# Patient Record
Sex: Female | Born: 1950
Health system: Southern US, Community
[De-identification: ages and names within clinical notes are randomized; demographics above are authoritative.]

## PROBLEM LIST (undated history)

## (undated) DIAGNOSIS — E7800 Pure hypercholesterolemia, unspecified: Secondary | ICD-10-CM

## (undated) DIAGNOSIS — D649 Anemia, unspecified: Secondary | ICD-10-CM

## (undated) DIAGNOSIS — I2510 Atherosclerotic heart disease of native coronary artery without angina pectoris: Secondary | ICD-10-CM

## (undated) DIAGNOSIS — I10 Essential (primary) hypertension: Secondary | ICD-10-CM

## (undated) HISTORY — PX: TONSILLECTOMY: SHX5217

## (undated) HISTORY — DX: Atherosclerotic heart disease of native coronary artery without angina pectoris: I25.10

## (undated) HISTORY — DX: Pure hypercholesterolemia, unspecified: E78.00

## (undated) HISTORY — DX: Anemia, unspecified: D64.9

## (undated) HISTORY — PX: WISDOM TOOTH EXTRACTION: SHX21

## (undated) HISTORY — PX: FOOT SURGERY: SHX648

## (undated) HISTORY — DX: Essential (primary) hypertension: I10

---

## 1998-01-07 ENCOUNTER — Emergency Department (HOSPITAL_COMMUNITY): Admission: EM | Admit: 1998-01-07 | Discharge: 1998-01-07 | Payer: Self-pay | Admitting: Emergency Medicine

## 2002-07-10 ENCOUNTER — Encounter: Payer: Self-pay | Admitting: Endocrinology

## 2002-07-10 ENCOUNTER — Ambulatory Visit (HOSPITAL_COMMUNITY): Admission: RE | Admit: 2002-07-10 | Discharge: 2002-07-10 | Payer: Self-pay | Admitting: Endocrinology

## 2003-04-02 ENCOUNTER — Other Ambulatory Visit: Admission: RE | Admit: 2003-04-02 | Discharge: 2003-04-02 | Payer: Self-pay | Admitting: *Deleted

## 2003-04-06 ENCOUNTER — Encounter: Admission: RE | Admit: 2003-04-06 | Discharge: 2003-04-06 | Payer: Self-pay | Admitting: *Deleted

## 2004-07-15 ENCOUNTER — Encounter: Admission: RE | Admit: 2004-07-15 | Discharge: 2004-07-15 | Payer: Self-pay | Admitting: *Deleted

## 2006-01-05 ENCOUNTER — Encounter: Admission: RE | Admit: 2006-01-05 | Discharge: 2006-01-05 | Payer: Self-pay | Admitting: Endocrinology

## 2007-01-15 ENCOUNTER — Encounter: Admission: RE | Admit: 2007-01-15 | Discharge: 2007-01-15 | Payer: Self-pay | Admitting: Endocrinology

## 2008-01-16 ENCOUNTER — Encounter: Admission: RE | Admit: 2008-01-16 | Discharge: 2008-01-16 | Payer: Self-pay | Admitting: Endocrinology

## 2008-03-06 ENCOUNTER — Emergency Department (HOSPITAL_COMMUNITY): Admission: EM | Admit: 2008-03-06 | Discharge: 2008-03-06 | Payer: Self-pay | Admitting: Emergency Medicine

## 2008-03-11 ENCOUNTER — Emergency Department (HOSPITAL_COMMUNITY): Admission: EM | Admit: 2008-03-11 | Discharge: 2008-03-11 | Payer: Self-pay | Admitting: Emergency Medicine

## 2009-01-18 ENCOUNTER — Encounter: Admission: RE | Admit: 2009-01-18 | Discharge: 2009-01-18 | Payer: Self-pay | Admitting: Endocrinology

## 2010-01-19 ENCOUNTER — Encounter: Admission: RE | Admit: 2010-01-19 | Discharge: 2010-01-19 | Payer: Self-pay | Admitting: Endocrinology

## 2010-07-19 ENCOUNTER — Ambulatory Visit: Payer: Self-pay | Admitting: Obstetrics and Gynecology

## 2010-08-16 ENCOUNTER — Ambulatory Visit: Payer: Self-pay | Admitting: Obstetrics & Gynecology

## 2010-09-13 ENCOUNTER — Ambulatory Visit: Payer: Self-pay | Admitting: Family Medicine

## 2010-10-18 ENCOUNTER — Encounter: Payer: Self-pay | Admitting: Family Medicine

## 2010-10-18 ENCOUNTER — Ambulatory Visit (INDEPENDENT_AMBULATORY_CARE_PROVIDER_SITE_OTHER): Payer: BC Managed Care – PPO | Admitting: Family Medicine

## 2010-10-18 DIAGNOSIS — E7800 Pure hypercholesterolemia, unspecified: Secondary | ICD-10-CM

## 2010-10-18 DIAGNOSIS — D649 Anemia, unspecified: Secondary | ICD-10-CM

## 2010-10-18 DIAGNOSIS — Z1272 Encounter for screening for malignant neoplasm of vagina: Secondary | ICD-10-CM

## 2010-10-18 DIAGNOSIS — Z01419 Encounter for gynecological examination (general) (routine) without abnormal findings: Secondary | ICD-10-CM

## 2010-10-18 DIAGNOSIS — I10 Essential (primary) hypertension: Secondary | ICD-10-CM

## 2010-10-18 DIAGNOSIS — E119 Type 2 diabetes mellitus without complications: Secondary | ICD-10-CM | POA: Insufficient documentation

## 2010-10-18 DIAGNOSIS — E1169 Type 2 diabetes mellitus with other specified complication: Secondary | ICD-10-CM | POA: Insufficient documentation

## 2010-10-18 NOTE — Progress Notes (Signed)
  Subjective:     Vicki Perry is a 60 y.o. female and is here for a comprehensive physical exam. The patient reports no problems.  History   Social History  . Marital Status: Single    Spouse Name: N/A    Number of Children: N/A  . Years of Education: N/A   Occupational History  . Not on file.   Social History Main Topics  . Smoking status: Never Smoker   . Smokeless tobacco: Not on file  . Alcohol Use: No  . Drug Use: No  . Sexually Active:    Other Topics Concern  . Not on file   Social History Narrative  . No narrative on file   No health maintenance topics applied.  The following portions of the patient's history were reviewed and updated as appropriate: allergies, current medications, past family history, past medical history, past social history, past surgical history and problem list.  Review of Systems A comprehensive review of systems was negative except for: Constitutional: positive for hot sweats   Objective:    BP 157/76  Pulse 59  Ht 5' (1.524 m)  Wt 144 lb (65.318 kg)  BMI 28.12 kg/m2 General appearance: alert, cooperative and appears stated age Head: Normocephalic, without obvious abnormality, atraumatic Neck: no adenopathy, no carotid bruit, no JVD, supple, symmetrical, trachea midline and thyroid not enlarged, symmetric, no tenderness/mass/nodules Lungs: clear to auscultation bilaterally Breasts: normal appearance, no masses or tenderness Heart: regular rate and rhythm, S1, S2 normal, no murmur, click, rub or gallop Abdomen: soft, non-tender; bowel sounds normal; no masses,  no organomegaly Pelvic: cervix normal in appearance, external genitalia normal, no adnexal masses or tenderness, no cervical motion tenderness, uterus normal size, shape, and consistency and atrophic vagina Extremities: extremities normal, atraumatic, no cyanosis or edema Pulses: 2+ and symmetric Skin: Skin color, texture, turgor normal. No rashes or lesions Lymph nodes:  Cervical, supraclavicular, and axillary nodes normal. Neurologic: Grossly normal    Assessment:    Healthy female exam. Postmenopausal. Doing well.     Plan:     Pap smear today. Mammogram in 10/12. F/u 1 year.

## 2010-12-19 ENCOUNTER — Other Ambulatory Visit: Payer: Self-pay | Admitting: Endocrinology

## 2010-12-19 DIAGNOSIS — Z1231 Encounter for screening mammogram for malignant neoplasm of breast: Secondary | ICD-10-CM

## 2011-01-23 ENCOUNTER — Ambulatory Visit
Admission: RE | Admit: 2011-01-23 | Discharge: 2011-01-23 | Disposition: A | Discharge status: Home / Self Care | Payer: BC Managed Care – PPO | Source: Ambulatory Visit | Attending: Endocrinology | Admitting: Endocrinology

## 2011-01-23 DIAGNOSIS — Z1231 Encounter for screening mammogram for malignant neoplasm of breast: Secondary | ICD-10-CM

## 2011-12-28 ENCOUNTER — Other Ambulatory Visit: Payer: Self-pay | Admitting: Endocrinology

## 2011-12-28 DIAGNOSIS — Z1231 Encounter for screening mammogram for malignant neoplasm of breast: Secondary | ICD-10-CM

## 2012-01-31 ENCOUNTER — Ambulatory Visit
Admission: RE | Admit: 2012-01-31 | Discharge: 2012-01-31 | Disposition: A | Discharge status: Home / Self Care | Payer: BC Managed Care – PPO | Source: Ambulatory Visit | Attending: Endocrinology | Admitting: Endocrinology

## 2012-01-31 DIAGNOSIS — Z1231 Encounter for screening mammogram for malignant neoplasm of breast: Secondary | ICD-10-CM

## 2012-12-26 ENCOUNTER — Other Ambulatory Visit: Payer: Self-pay

## 2012-12-26 DIAGNOSIS — Z1231 Encounter for screening mammogram for malignant neoplasm of breast: Secondary | ICD-10-CM

## 2013-01-08 ENCOUNTER — Other Ambulatory Visit: Payer: BC Managed Care – PPO

## 2013-01-31 ENCOUNTER — Other Ambulatory Visit: Payer: Self-pay | Admitting: *Deleted

## 2013-01-31 ENCOUNTER — Other Ambulatory Visit (INDEPENDENT_AMBULATORY_CARE_PROVIDER_SITE_OTHER): Payer: BC Managed Care – PPO

## 2013-01-31 ENCOUNTER — Ambulatory Visit
Admission: RE | Admit: 2013-01-31 | Discharge: 2013-01-31 | Disposition: A | Discharge status: Home / Self Care | Payer: BC Managed Care – PPO | Source: Ambulatory Visit

## 2013-01-31 DIAGNOSIS — E7800 Pure hypercholesterolemia, unspecified: Secondary | ICD-10-CM

## 2013-01-31 DIAGNOSIS — E119 Type 2 diabetes mellitus without complications: Secondary | ICD-10-CM

## 2013-01-31 DIAGNOSIS — Z1231 Encounter for screening mammogram for malignant neoplasm of breast: Secondary | ICD-10-CM

## 2013-01-31 LAB — BASIC METABOLIC PANEL
CO2: 27 mEq/L (ref 19–32)
Chloride: 104 mEq/L (ref 96–112)
Glucose, Bld: 80 mg/dL (ref 70–99)
Potassium: 4 mEq/L (ref 3.5–5.1)
Sodium: 136 mEq/L (ref 135–145)

## 2013-02-02 ENCOUNTER — Other Ambulatory Visit: Payer: Self-pay | Admitting: Endocrinology

## 2013-02-10 ENCOUNTER — Encounter: Payer: Self-pay | Admitting: Endocrinology

## 2013-02-10 ENCOUNTER — Ambulatory Visit (INDEPENDENT_AMBULATORY_CARE_PROVIDER_SITE_OTHER): Payer: BC Managed Care – PPO | Admitting: Endocrinology

## 2013-02-10 VITALS — BP 130/70 | HR 64 | Temp 98.6°F | Resp 12 | Ht 61.0 in | Wt 141.9 lb

## 2013-02-10 DIAGNOSIS — I10 Essential (primary) hypertension: Secondary | ICD-10-CM

## 2013-02-10 DIAGNOSIS — Z23 Encounter for immunization: Secondary | ICD-10-CM

## 2013-02-10 DIAGNOSIS — E7800 Pure hypercholesterolemia, unspecified: Secondary | ICD-10-CM

## 2013-02-10 DIAGNOSIS — E119 Type 2 diabetes mellitus without complications: Secondary | ICD-10-CM | POA: Insufficient documentation

## 2013-02-10 DIAGNOSIS — D649 Anemia, unspecified: Secondary | ICD-10-CM

## 2013-02-10 DIAGNOSIS — E559 Vitamin D deficiency, unspecified: Secondary | ICD-10-CM

## 2013-02-10 NOTE — Patient Instructions (Signed)
Restart Exercise Please check blood sugars at least half the time about 2 hours after any meal and as directed on waking up. Please bring blood sugar monitor to each visit  

## 2013-02-10 NOTE — Progress Notes (Signed)
Patient ID: Vicki Perry, female   DOB: November 25, 1950, 62 y.o.   MRN: [id number]   Reason for Appointment: Diabetes and blood pressure follow-up   History of Present Illness   Diagnosis: Type 2 DIABETES MELITUS, date of diagnosis:  2008     Previous history: She was diagnosed with mild diabetes in 2008 with a fasting glucose of 130 and A1c of 6.6 Subsequently has been on low dose metformin with excellent control  Recent history: She has been generally watching her diet although not exercising regularly recently because of family illness However she has been able to lose a little weight and her glucose readings at home are fairly good A1c is upper normal, previous level was 5.9     Oral hypoglycemic drugs: Metformin        Side effects from medications: None       Monitors blood glucose: Once a day.    Glucometer:  Accu-Chek         Blood Glucose readings from recall: readings before breakfast: 95- 100; pc150 Hypoglycemia frequency:  none         Meals: 3 meals per day.          Physical activity: exercise: None recently            Dietician visit: Most recent 2009   Weight control: Wt Readings from Last 3 Encounters:  02/10/13 141 lb 14.4 oz (64.365 kg)  10/18/10 144 lb (65.318 kg)          Complications: None     Diabetes labs:  Lab Results  Component Value Date   HGBA1C 6.3 01/31/2013   Lab Results  Component Value Date   CREATININE 0.9 01/31/2013      Medication List       This list is accurate as of: 02/10/13  8:32 AM.  Always use your most recent med list.               lisinopril 20 MG tablet  Commonly known as:  PRINIVIL,ZESTRIL  TAKE 1 TABLET DAILY     metFORMIN 500 MG tablet  Commonly known as:  GLUCOPHAGE  Take 500 mg by mouth 2 (two) times daily with a meal.     simvastatin 20 MG tablet  Commonly known as:  ZOCOR  TAKE 1 TABLET DAILY        Allergies:  Allergies  Allergen Reactions  . Codeine Nausea Only    Past Medical History   Diagnosis Date  . Hypertension   . Diabetes mellitus   . High cholesterol   . Anemia     Past Surgical History  Procedure Laterality Date  . Tonsillectomy    . Wisdom tooth extraction    . Cesarean section      Family History  Problem Relation Age of Onset  . Diabetes Mother   . Hypertension Mother   . Cancer Mother 70    uterine  . Hypertension Father   . Diabetes Maternal Grandmother   . Hypertension Sister   . Diabetes Sister   . Hypertension Brother     Social History:  reports that she has never smoked. She does not have any smokeless tobacco history on file. She reports that she does not drink alcohol or use illicit drugs.  Review of Systems:  Hypertension:  she has had hypertension since 2003. Has been well controlled with lisinopril. Usually watching salt intake. At home BP is about 126/70  Lipids: This has been previously well  controlled with 20 mg simvastatin  ANEMIA: She has had iron deficiency anemia and has been consistently taking her supplements    LABS:  No visits with results within 1 Week(s) from this visit. Latest known visit with results is:  Appointment on 01/31/2013  Component Date Value Range Status  . Sodium 01/31/2013 136  135 - 145 mEq/L Final  . Potassium 01/31/2013 4.0  3.5 - 5.1 mEq/L Final  . Chloride 01/31/2013 104  96 - 112 mEq/L Final  . CO2 01/31/2013 27  19 - 32 mEq/L Final  . Glucose, Bld 01/31/2013 80  70 - 99 mg/dL Final  . BUN 16/12/9602 7  6 - 23 mg/dL Final  . Creatinine, Ser 01/31/2013 0.9  0.4 - 1.2 mg/dL Final  . Calcium 54/11/8117 9.2  8.4 - 10.5 mg/dL Final  . GFR 14/78/2956 78.86  >60.00 mL/min Final  . Hemoglobin A1C 01/31/2013 6.3  4.6 - 6.5 % Final   Glycemic Control Guidelines for People with Diabetes:Non Diabetic:  <6%Goal of Therapy: <7%Additional Action Suggested:  >8%      Examination:   BP 130/70  Pulse 64  Temp(Src) 98.6 F (37 C)  Resp 12  Ht 5\' 1"  (1.549 m)  Wt 141 lb 14.4 oz (64.365 kg)   BMI 26.83 kg/m2  SpO2 97%  Body mass index is 26.83 kg/(m^2).    ASSESSMENT/ PLAN::   1. Diabetes type 2  Blood glucose control is still fairly good with metformin alone. A1c upper normal but should be better with her resuming exercise, discussed  She will be given a new glucose monitor that she can use for herself and bring it for download. Needs to do more readings after meals than in the morning tomorrow discussed  2. HYPERTENSION: Well controlled, continue 20 mg Zestril  3. Anemia: She will have this followed up on her next visit. She thinks she is due for colonoscopy next year also  4. Recent mammogram normal  5. To check lipids on her next visit  6. She will get influenza vaccine today  Nieve Rojero 02/10/2013, 8:32 AM

## 2013-03-06 ENCOUNTER — Other Ambulatory Visit: Payer: Self-pay | Admitting: *Deleted

## 2013-03-06 MED ORDER — METFORMIN HCL 500 MG PO TABS: 500.0000 mg | ORAL_TABLET | Freq: Two times a day (BID) | ORAL | Status: DC

## 2013-04-08 ENCOUNTER — Other Ambulatory Visit: Payer: Self-pay | Admitting: Endocrinology

## 2013-04-09 ENCOUNTER — Other Ambulatory Visit: Payer: Self-pay | Admitting: *Deleted

## 2013-04-09 MED ORDER — ACCU-CHEK SOFTCLIX LANCETS MISC: Status: DC

## 2013-07-30 ENCOUNTER — Other Ambulatory Visit: Payer: Self-pay | Admitting: Endocrinology

## 2013-08-01 ENCOUNTER — Other Ambulatory Visit: Payer: Self-pay | Admitting: Endocrinology

## 2013-08-08 ENCOUNTER — Other Ambulatory Visit: Payer: BC Managed Care – PPO

## 2013-08-13 ENCOUNTER — Ambulatory Visit: Payer: BC Managed Care – PPO | Admitting: Endocrinology

## 2013-09-10 ENCOUNTER — Ambulatory Visit (INDEPENDENT_AMBULATORY_CARE_PROVIDER_SITE_OTHER): Payer: Medicare Other | Admitting: Internal Medicine

## 2013-09-10 ENCOUNTER — Encounter: Payer: Self-pay | Admitting: Internal Medicine

## 2013-09-10 VITALS — BP 118/62 | HR 77 | Temp 98.1°F | Ht 60.5 in | Wt 142.0 lb

## 2013-09-10 DIAGNOSIS — I10 Essential (primary) hypertension: Secondary | ICD-10-CM

## 2013-09-10 DIAGNOSIS — Z23 Encounter for immunization: Secondary | ICD-10-CM

## 2013-09-10 DIAGNOSIS — E119 Type 2 diabetes mellitus without complications: Secondary | ICD-10-CM

## 2013-09-10 DIAGNOSIS — Z1211 Encounter for screening for malignant neoplasm of colon: Secondary | ICD-10-CM

## 2013-09-10 DIAGNOSIS — E785 Hyperlipidemia, unspecified: Secondary | ICD-10-CM

## 2013-09-10 LAB — COMPREHENSIVE METABOLIC PANEL
ALBUMIN: 3.8 g/dL (ref 3.5–5.2)
ALT: 17 U/L (ref 0–35)
AST: 23 U/L (ref 0–37)
Alkaline Phosphatase: 65 U/L (ref 39–117)
BUN: 16 mg/dL (ref 6–23)
CALCIUM: 9 mg/dL (ref 8.4–10.5)
CO2: 31 meq/L (ref 19–32)
Chloride: 104 mEq/L (ref 96–112)
Creatinine, Ser: 1.1 mg/dL (ref 0.4–1.2)
GFR: 64.72 mL/min (ref >60.00)
Glucose, Bld: 95 mg/dL (ref 70–99)
POTASSIUM: 4 meq/L (ref 3.5–5.1)
Sodium: 138 mEq/L (ref 135–145)
TOTAL PROTEIN: 6.6 g/dL (ref 6.0–8.3)
Total Bilirubin: 0.5 mg/dL (ref 0.2–1.2)

## 2013-09-10 LAB — LIPID PANEL
CHOL/HDL RATIO: 3
Cholesterol: 103 mg/dL (ref 0–200)
HDL: 37.5 mg/dL — ABNORMAL LOW (ref >39.00)
LDL Cholesterol: 47 mg/dL (ref 0–99)
NONHDL: 65.5
Triglycerides: 95 mg/dL (ref 0.0–149.0)
VLDL: 19 mg/dL (ref 0.0–40.0)

## 2013-09-10 LAB — CBC
HCT: 33.9 % — ABNORMAL LOW (ref 36.0–46.0)
HEMOGLOBIN: 11.4 g/dL — AB (ref 12.0–15.0)
MCHC: 33.6 g/dL (ref 30.0–36.0)
MCV: 84.3 fl (ref 78.0–100.0)
Platelets: 267 10*3/uL (ref 150.0–400.0)
RBC: 4.02 Mil/uL (ref 3.87–5.11)
RDW: 12.4 % (ref 11.5–15.5)
WBC: 8.1 10*3/uL (ref 4.0–10.5)

## 2013-09-10 LAB — MICROALBUMIN / CREATININE URINE RATIO
CREATININE, U: 14 mg/dL
MICROALB UR: 0.2 mg/dL (ref 0.0–1.9)
MICROALB/CREAT RATIO: 1.4 mg/g (ref 0.0–30.0)

## 2013-09-10 LAB — HEMOGLOBIN A1C: HEMOGLOBIN A1C: 6.5 % (ref 4.6–6.5)

## 2013-09-10 MED ORDER — METFORMIN HCL 500 MG PO TABS: 500.0000 mg | ORAL_TABLET | Freq: Two times a day (BID) | ORAL | Status: DC

## 2013-09-10 NOTE — Patient Instructions (Addendum)

## 2013-09-10 NOTE — Assessment & Plan Note (Signed)
No issues on zocor Will repeat lipid profile and CMET today

## 2013-09-10 NOTE — Assessment & Plan Note (Signed)
Well controlled on Lisinopril Will check CBC and CMET today

## 2013-09-10 NOTE — Addendum Note (Signed)
Addended by: Roena MaladyEVONTENNO, Glen Blatch[place] Y on: 09/10/2013 02:50 PM   Modules accepted: Orders

## 2013-09-10 NOTE — Progress Notes (Signed)
HPI  Pt presents to the clinic today to establish care. She has not had a PCP in many years. She has seen Dr. Lucianne MussKumar at [facility].  Flu: 01/2013 Tetanus: more than 10 years ago Pneumovax: 06/2012 Zostovax: never Pap Smear: 2012- normal Mammogram: 01/2013 Bone Density: never Colon Screening: 2010 (5 years) Eye Doctor: as needed Dentist: as needed  Past Medical History  Diagnosis Date  . Hypertension   . Diabetes mellitus   . High cholesterol   . Anemia     Current Outpatient Prescriptions  Medication Sig Dispense Refill  . ACCU-CHEK COMPACT PLUS test strip CHECK UP TO 3 TIMES DAILY  102 each  1  . ACCU-CHEK SOFTCLIX LANCETS lancets Use as instructed to check blood sugars 3 times per day  100 each  5  . ferrous fumarate (HEMOCYTE - 106 MG FE) 325 (106 FE) MG TABS tablet Take 1 tablet by mouth 2 (two) times a week.      Marland Kitchen. lisinopril (PRINIVIL,ZESTRIL) 20 MG tablet TAKE 1 TABLET DAILY  90 tablet  0  . metFORMIN (GLUCOPHAGE) 500 MG tablet Take 1 tablet (500 mg total) by mouth 2 (two) times daily with a meal.  60 tablet  5  . simvastatin (ZOCOR) 20 MG tablet TAKE 1 TABLET DAILY  90 tablet  0   No current facility-administered medications for this visit.    Allergies  Allergen Reactions  . Codeine Nausea Only    Family History  Problem Relation Age of Onset  . Diabetes Mother   . Hypertension Mother   . Cancer Mother [id number]    Cervical  . Hypertension Father   . Diabetes Maternal Grandmother   . Hypertension Sister   . Diabetes Sister   . Hypertension Brother     History   Social History  . Marital Status: Single    Spouse Name: N/A    Number of Children: N/A  . Years of Education: N/A   Occupational History  . Not on file.   Social History Main Topics  . Smoking status: Never Smoker   . Smokeless tobacco: Not on file  . Alcohol Use: No  . Drug Use: No  . Sexual Activity: Not on file   Other Topics Concern  . Not on file   Social History Narrative  .  No narrative on file    ROS:  Constitutional: Denies fever, malaise, fatigue, headache or abrupt weight changes.  Respiratory: Denies difficulty breathing, shortness of breath, cough or sputum production.   Cardiovascular: Denies chest pain, chest tightness, palpitations or swelling in the hands or feet.  Gastrointestinal: Denies abdominal pain, bloating, constipation, diarrhea or blood in the stool.  Skin: Denies redness, rashes, lesions or ulcercations.  Neurological: Denies dizziness, difficulty with memory, difficulty with speech or problems with balance and coordination.   No other specific complaints in a complete review of systems (except as listed in HPI above).  PE:  BP 118/62  Pulse 77  Temp(Src) 98.1 F (36.7 C) (Oral)  Ht 5' 0.5" (1.537 m)  Wt 142 lb (64.411 kg)  BMI 27.27 kg/m2  SpO2 98% Wt Readings from Last 3 Encounters:  09/10/13 142 lb (64.411 kg)  02/10/13 141 lb 14.4 oz (64.365 kg)  10/18/10 144 lb (65.318 kg)    General: Appears her stated age, well developed, well nourished in NAD. Cardiovascular: Normal rate and rhythm. S1,S2 noted.  No murmur, rubs or gallops noted. No JVD or BLE edema. No carotid bruits noted. Pulmonary/Chest: Normal  effort and positive vesicular breath sounds. No respiratory distress. No wheezes, rales or ronchi noted.  Abdomen: Soft and nontender. Normal bowel sounds, no bruits noted. No distention or masses noted. Liver, spleen and kidneys non palpable. Neurological: Alert and oriented. Cranial nerves II-XII intact. Coordination normal. +DTRs bilaterally.  BMET    Component Value Date/Time   NA 136 01/31/2013 1306   K 4.0 01/31/2013 1306   CL 104 01/31/2013 1306   CO2 27 01/31/2013 1306   GLUCOSE 80 01/31/2013 1306   BUN 7 01/31/2013 1306   CREATININE 0.9 01/31/2013 1306   CALCIUM 9.2 01/31/2013 1306    Lipid Panel  No results found for this basename: chol, trig, hdl, cholhdl, vldl, ldlcalc    CBC No results found for  this basename: wbc, rbc, hgb, hct, plt, mcv, mch, mchc, rdw, neutrabs, lymphsabs, monoabs, eosabs, basosabs    Hgb A1C Lab Results  Component Value Date   HGBA1C 6.3 01/31/2013     Assessment and Plan:  Preventative Health:  She declines Zoxtovax and Bone Density screening Will refer for colon screening Tdap given today  RTC in 6 months for follow up chronic medical conditions

## 2013-09-10 NOTE — Progress Notes (Signed)
Pre visit review using our clinic review tool, if applicable. No additional management support is needed unless otherwise documented below in the visit note. 

## 2013-09-10 NOTE — Assessment & Plan Note (Signed)
-  Asymptomatic. -Will repeat CBC today. 

## 2013-09-10 NOTE — Assessment & Plan Note (Signed)
Rarely checks BS Last A1c 6.3% Reports she only takes 500 mg metformin 1 x day Will check A1C and microalbumin today

## 2013-10-08 ENCOUNTER — Other Ambulatory Visit: Payer: Self-pay | Admitting: Endocrinology

## 2013-12-30 ENCOUNTER — Other Ambulatory Visit: Payer: Self-pay

## 2013-12-30 DIAGNOSIS — Z1231 Encounter for screening mammogram for malignant neoplasm of breast: Secondary | ICD-10-CM

## 2014-02-02 ENCOUNTER — Encounter (INDEPENDENT_AMBULATORY_CARE_PROVIDER_SITE_OTHER): Payer: Self-pay

## 2014-02-02 ENCOUNTER — Ambulatory Visit
Admission: RE | Admit: 2014-02-02 | Discharge: 2014-02-02 | Disposition: A | Discharge status: Home / Self Care | Payer: Medicare Other | Source: Ambulatory Visit

## 2014-02-02 DIAGNOSIS — Z1231 Encounter for screening mammogram for malignant neoplasm of breast: Secondary | ICD-10-CM

## 2014-02-11 ENCOUNTER — Telehealth: Payer: Self-pay | Admitting: Endocrinology

## 2014-02-11 ENCOUNTER — Telehealth: Payer: Self-pay

## 2014-02-11 NOTE — Telephone Encounter (Signed)
Denied, patient has not been seen since 02/10/2013, she needs to be seen before refills are given.

## 2014-02-11 NOTE — Telephone Encounter (Signed)
[facility] called to get refill for lisinopril; was denied by Dr Lucianne MussKumar who was last physician to prescribe lisinopril in 07/30/13 # 90. Does not appear pt taking med regularly. Pt was seen by Nicki Reaperegina Baity NP 08/2013 but has not prescribed lisinopril before and since question if pt taking regularly will send note to NP; [facility] aware may be next week for cb.

## 2014-02-11 NOTE — Telephone Encounter (Signed)
Please call in [facility]ril to [facility] [address]

## 2014-02-16 MED ORDER — LISINOPRIL 20 MG PO TABS: 20.0000 mg | ORAL_TABLET | Freq: Every day | ORAL | Status: DC

## 2014-02-16 NOTE — Addendum Note (Signed)
Addended by: Roena MaladyEVONTENNO, Danaka Llera Y on: 02/16/2014 10:48 AM   Modules accepted: Orders

## 2014-02-16 NOTE — Telephone Encounter (Signed)
OK to refill, she needs to be taking it regulary

## 2014-02-26 ENCOUNTER — Other Ambulatory Visit: Payer: Self-pay | Admitting: Internal Medicine

## 2014-02-26 ENCOUNTER — Ambulatory Visit (INDEPENDENT_AMBULATORY_CARE_PROVIDER_SITE_OTHER): Payer: Medicare Other | Admitting: Internal Medicine

## 2014-02-26 ENCOUNTER — Encounter: Payer: Self-pay | Admitting: Internal Medicine

## 2014-02-26 VITALS — BP 140/68 | HR 61 | Temp 97.7°F | Ht 60.8 in | Wt 143.0 lb

## 2014-02-26 DIAGNOSIS — I10 Essential (primary) hypertension: Secondary | ICD-10-CM

## 2014-02-26 DIAGNOSIS — Z13820 Encounter for screening for osteoporosis: Secondary | ICD-10-CM

## 2014-02-26 DIAGNOSIS — E7800 Pure hypercholesterolemia, unspecified: Secondary | ICD-10-CM

## 2014-02-26 DIAGNOSIS — Z23 Encounter for immunization: Secondary | ICD-10-CM

## 2014-02-26 DIAGNOSIS — Z0000 Encounter for general adult medical examination without abnormal findings: Secondary | ICD-10-CM

## 2014-02-26 DIAGNOSIS — M8580 Other specified disorders of bone density and structure, unspecified site: Secondary | ICD-10-CM

## 2014-02-26 DIAGNOSIS — E119 Type 2 diabetes mellitus without complications: Secondary | ICD-10-CM

## 2014-02-26 DIAGNOSIS — M25641 Stiffness of right hand, not elsewhere classified: Secondary | ICD-10-CM

## 2014-02-26 LAB — COMPREHENSIVE METABOLIC PANEL
ALBUMIN: 3.8 g/dL (ref 3.5–5.2)
ALK PHOS: 70 U/L (ref 39–117)
ALT: 21 U/L (ref 0–35)
AST: 40 U/L — ABNORMAL HIGH (ref 0–37)
BUN: 10 mg/dL (ref 6–23)
CO2: 25 mEq/L (ref 19–32)
Calcium: 8.6 mg/dL (ref 8.4–10.5)
Chloride: 104 mEq/L (ref 96–112)
Creatinine, Ser: 1 mg/dL (ref 0.4–1.2)
GFR: 73.94 mL/min (ref >60.00)
GLUCOSE: 91 mg/dL (ref 70–99)
POTASSIUM: 4.1 meq/L (ref 3.5–5.1)
SODIUM: 134 meq/L — AB (ref 135–145)
TOTAL PROTEIN: 6.5 g/dL (ref 6.0–8.3)
Total Bilirubin: 0.4 mg/dL (ref 0.2–1.2)

## 2014-02-26 LAB — LIPID PANEL
CHOLESTEROL: 117 mg/dL (ref 0–200)
HDL: 41.2 mg/dL (ref >39.00)
LDL Cholesterol: 62 mg/dL (ref 0–99)
NonHDL: 75.8
Total CHOL/HDL Ratio: 3
Triglycerides: 67 mg/dL (ref 0.0–149.0)
VLDL: 13.4 mg/dL (ref 0.0–40.0)

## 2014-02-26 LAB — CBC
HCT: 36.1 % (ref 36.0–46.0)
Hemoglobin: 11.8 g/dL — ABNORMAL LOW (ref 12.0–15.0)
MCHC: 32.7 g/dL (ref 30.0–36.0)
MCV: 85.9 fl (ref 78.0–100.0)
Platelets: 230 10*3/uL (ref 150.0–400.0)
RBC: 4.2 Mil/uL (ref 3.87–5.11)
RDW: 12.5 % (ref 11.5–15.5)
WBC: 6.3 10*3/uL (ref 4.0–10.5)

## 2014-02-26 LAB — HEMOGLOBIN A1C: HEMOGLOBIN A1C: 6.4 % (ref 4.6–6.5)

## 2014-02-26 LAB — VITAMIN D 25 HYDROXY (VIT D DEFICIENCY, FRACTURES): VITD: 25.71 ng/mL — AB (ref 30.00–100.00)

## 2014-02-26 NOTE — Progress Notes (Signed)
HPI:  Pt presents to the clinic today for her initial medicare wellness visit.  Additionally, she is here to follow up chronic medical conditions. See separate note at the end of the Medicare Wellness Visit Note.  Past Medical History  Diagnosis Date  . Hypertension   . Diabetes mellitus   . High cholesterol   . Anemia     Current Outpatient Prescriptions  Medication Sig Dispense Refill  . ACCU-CHEK COMPACT PLUS test strip CHECK UP TO 3 TIMES DAILY 102 each 1  . ACCU-CHEK SOFTCLIX LANCETS lancets Use as instructed to check blood sugars 3 times per day 100 each 5  . ferrous fumarate (HEMOCYTE - 106 MG FE) 325 (106 FE) MG TABS tablet Take 1 tablet by mouth 2 (two) times a week.    Marland Kitchen. lisinopril (PRINIVIL,ZESTRIL) 20 MG tablet Take 1 tablet (20 mg total) by mouth daily. 90 tablet 0  . metFORMIN (GLUCOPHAGE) 500 MG tablet Take 1 tablet (500 mg total) by mouth 2 (two) times daily with a meal. 60 tablet 5  . simvastatin (ZOCOR) 20 MG tablet TAKE 1 TABLET DAILY 90 tablet 0   No current facility-administered medications for this visit.    Allergies  Allergen Reactions  . Codeine Nausea Only    Family History  Problem Relation Age of Onset  . Diabetes Mother   . Hypertension Mother   . Cancer Mother [id number]    Cervical  . Hypertension Father   . Diabetes Maternal Grandmother   . Hypertension Sister   . Diabetes Sister   . Hypertension Brother   . Heart disease Neg Hx   . Stroke Neg Hx     History   Social History  . Marital Status: Single    Spouse Name: N/A    Number of Children: N/A  . Years of Education: N/A   Occupational History  . Not on file.   Social History Main Topics  . Smoking status: Never Smoker   . Smokeless tobacco: Not on file  . Alcohol Use: No  . Drug Use: No  . Sexual Activity: Not on file   Other Topics Concern  . Not on file   Social History Narrative  . No narrative on file    Hospitiliaztions: None  Health Maintenance:    Flu: 2014,  wants one today  Tetanus: 09/10/2013  Pneumovax: unsure  Prevnar: 07/12/2012  Zostavax: Never- not interested  Mammogram: 02/02/2014  Pap Smear: 10/18/2010, no longer wants to screen  Bone Density: 01/16/2008  Colonoscopy: 05/21/2006 (10 years)  Eye Doctor: She thinks in 2012  Dental Exam: Biannually  Providers:  PCP: Nicki Reaperegina Baity, NP-C Opthamologist: She cannot remember the name of the doctor she sees Dentist: Daine GipGraham Farlass, DMD  I have personally reviewed and have noted:  1. The patient's medical and social history 2. Their use of alcohol, tobacco or illicit drugs 3. Their current medications and supplements 4. The patient's functional ability including ADL's, fall risks,  home safety risks and hearing or visual impairment. 5. Diet and physical activities 6. Evidence for depression or mood disorder  Subjective:   Review of Systems:   Constitutional: Denies fever, malaise, fatigue, headache or abrupt weight changes.  HEENT: Pt reports blurred vision. Denies eye pain, eye redness, ear pain, ringing in the ears, wax buildup, runny nose, nasal congestion, bloody nose, or sore throat. Respiratory: Denies difficulty breathing, shortness of breath, cough or sputum production.   Cardiovascular: Denies chest pain, chest tightness, palpitations or swelling in the  hands or feet.  Gastrointestinal: Denies abdominal pain, bloating, constipation, diarrhea or blood in the stool.  GU: Denies urgency, frequency, pain with urination, burning sensation, blood in urine, odor or discharge. Musculoskeletal: Pt reports right hand stiffness. Denies decrease in range of motion, difficulty with gait, muscle pain or joint swelling.  Skin: Denies redness, rashes, lesions or ulcercations.  Neurological: Pt reports numbness in right hand and numbness in left foot. Denies dizziness, difficulty with memory, difficulty with speech or problems with balance and coordination.   No other specific complaints in  a complete review of systems (except as listed in HPI above).  Objective:  PE:   BP 140/68 mmHg  Pulse 61  Temp(Src) 97.7 F (36.5 C) (Oral)  Ht 5' 0.8" (1.544 m)  Wt 143 lb (64.864 kg)  BMI 27.21 kg/m2  SpO2 98%  Wt Readings from Last 3 Encounters:  09/10/13 142 lb (64.411 kg)  02/10/13 141 lb 14.4 oz (64.365 kg)  10/18/10 144 lb (65.318 kg)    General: Appears her stated age, well developed, well nourished in NAD. Cardiovascular: Bradycardic with normal rhythm. S1,S2 noted.  No murmur, rubs or gallops noted. No JVD or BLE edema. No carotid bruits noted. Pulmonary/Chest: Normal effort and positive vesicular breath sounds. No respiratory distress. No wheezes, rales or ronchi noted.  MSK: Normal flexion and extension of right hand and fingers. Hand grips equal bilaterally. Neuro: Sensation intact to 10 gm monofilament bilateral feet. Sensation intact to dull/sharp touch bilateral hands.  EKG: Sinus brady, within normal limits  BMET    Component Value Date/Time   NA 138 09/10/2013 1412   K 4.0 09/10/2013 1412   CL 104 09/10/2013 1412   CO2 31 09/10/2013 1412   GLUCOSE 95 09/10/2013 1412   BUN 16 09/10/2013 1412   CREATININE 1.1 09/10/2013 1412   CALCIUM 9.0 09/10/2013 1412    Lipid Panel     Component Value Date/Time   CHOL 103 09/10/2013 1412   TRIG 95.0 09/10/2013 1412   HDL 37.50* 09/10/2013 1412   CHOLHDL 3 09/10/2013 1412   VLDL 19.0 09/10/2013 1412   LDLCALC 47 09/10/2013 1412    CBC    Component Value Date/Time   WBC 8.1 09/10/2013 1412   RBC 4.02 09/10/2013 1412   HGB 11.4* 09/10/2013 1412   HCT 33.9* 09/10/2013 1412   PLT 267.0 09/10/2013 1412   MCV 84.3 09/10/2013 1412   MCHC 33.6 09/10/2013 1412   RDW 12.4 09/10/2013 1412    Hgb A1C Lab Results  Component Value Date   HGBA1C 6.5 09/10/2013      Assessment and Plan:   Medicare Annual Wellness Visit:  Diet: Eats what she wants in moderation, reinforced diabetic diet Physical  activity: Walking 2 days per weeks, encouraged her to increase this Depression/mood screen: Negative Hearing: Intact to whispered voice Visual acuity: Grossly normal, performs eye exam when needed ADLs: Capable Fall risk: None Home safety: Good Cognitive evaluation: Intact to orientation, naming, recall and repetition EOL planning: No adv directives (she request information today), full code/ I agree  Preventative Medicine:  Handout given on advance directives Will order bone density She declines repeat pap screening She declines zostovax If she cannot find out when she had last pneumovax, will give at next visit to make sure she is covered.  Next appointment: 6 months   HPI:  Here for 6 month followup of chronic conditions.  HTN: BP well controlled on Lisinopril. BP today 140/68.  DM2: No hypoglycemia. Last  eye exam 2012. Flu and pneumonia vaccines are UTD. Last A1C 6.5% on Metformin 500 mg once daily. Her weight is stable.  HLD: LDL 47 on Zocor. She denies myalgias.  Anemia: Reports she stopped taking the iron pill, not because of constipation but because she keeps forgetting to buy it at the store. No signs of bleeding noted.  Past Medical History  Diagnosis Date  . Hypertension   . Diabetes mellitus   . High cholesterol   . Anemia    Current Outpatient Prescriptions on File Prior to Visit  Medication Sig Dispense Refill  . ACCU-CHEK COMPACT PLUS test strip CHECK UP TO 3 TIMES DAILY 102 each 1  . ACCU-CHEK SOFTCLIX LANCETS lancets Use as instructed to check blood sugars 3 times per day 100 each 5  . lisinopril (PRINIVIL,ZESTRIL) 20 MG tablet Take 1 tablet (20 mg total) by mouth daily. 90 tablet 0  . metFORMIN (GLUCOPHAGE) 500 MG tablet Take 1 tablet (500 mg total) by mouth 2 (two) times daily with a meal. 60 tablet 5  . simvastatin (ZOCOR) 20 MG tablet TAKE 1 TABLET DAILY 90 tablet 0   No current facility-administered medications on file prior to visit.   History    Social History  . Marital Status: Single    Spouse Name: N/A    Number of Children: N/A  . Years of Education: N/A   Occupational History  . Not on file.   Social History Main Topics  . Smoking status: Never Smoker   . Smokeless tobacco: Not on file  . Alcohol Use: No  . Drug Use: No  . Sexual Activity: Not on file   Other Topics Concern  . Not on file   Social History Narrative  . No narrative on file   Review of Systems:   Constitutional: Denies fever, malaise, fatigue, headache or abrupt weight changes.  HEENT: Pt reports blurred vision. Denies eye pain, eye redness, ear pain, ringing in the ears, wax buildup, runny nose, nasal congestion, bloody nose, or sore throat. Respiratory: Denies difficulty breathing, shortness of breath, cough or sputum production.   Cardiovascular: Denies chest pain, chest tightness, palpitations or swelling in the hands or feet.  Gastrointestinal: Denies abdominal pain, bloating, constipation, diarrhea or blood in the stool.  GU: Denies urgency, frequency, pain with urination, burning sensation, blood in urine, odor or discharge. Musculoskeletal: Pt reports right hand stiffness. Denies decrease in range of motion, difficulty with gait, muscle pain or joint swelling.  Skin: Denies redness, rashes, lesions or ulcercations.  Neurological: Pt reports numbness in right hand and left foot. Denies dizziness, difficulty with memory, difficulty with speech or problems with balance and coordination.   No other specific complaints in a complete review of systems (except as listed in HPI above).   Physical Exam:  BP 140/68 mmHg  Pulse 61  Temp(Src) 97.7 F (36.5 C) (Oral)  Ht 5' 0.8" (1.544 m)  Wt 143 lb (64.864 kg)  BMI 27.21 kg/m2  SpO2 98%  Wt Readings from Last 3 Encounters:  02/26/14 143 lb (64.864 kg)  09/10/13 142 lb (64.411 kg)  02/10/13 141 lb 14.4 oz (64.365 kg)    General: Appears her stated age, well developed, well nourished  in NAD. Skin: Warm, dry and intact. No rashes, lesions or ulcerations noted. Cardiovascular: Normal rate and rhythm. S1,S2 noted.  No murmur, rubs or gallops noted. No JVD or BLE edema. No carotid bruits noted. Pedal pulses 2+, Radial pulses 2+. Pulmonary/Chest: Normal effort and positive  vesicular breath sounds. No respiratory distress. No wheezes, rales or ronchi noted.  Abdomen: Soft and nontender. Normal bowel sounds, no bruits noted. No distention or masses noted.  MSK: Normal flexion and extension of right hand and fingers. Hand grips equal bilaterally. Neuro: Sensation intact to 10 gm monofilament bilateral feet. Sensation intact to dull/sharp touch bilateral hands.   BMET    Component Value Date/Time   NA 138 09/10/2013 1412   K 4.0 09/10/2013 1412   CL 104 09/10/2013 1412   CO2 31 09/10/2013 1412   GLUCOSE 95 09/10/2013 1412   BUN 16 09/10/2013 1412   CREATININE 1.1 09/10/2013 1412   CALCIUM 9.0 09/10/2013 1412    Lipid Panel     Component Value Date/Time   CHOL 103 09/10/2013 1412   TRIG 95.0 09/10/2013 1412   HDL 37.50* 09/10/2013 1412   CHOLHDL 3 09/10/2013 1412   VLDL 19.0 09/10/2013 1412   LDLCALC 47 09/10/2013 1412    CBC    Component Value Date/Time   WBC 8.1 09/10/2013 1412   RBC 4.02 09/10/2013 1412   HGB 11.4* 09/10/2013 1412   HCT 33.9* 09/10/2013 1412   PLT 267.0 09/10/2013 1412   MCV 84.3 09/10/2013 1412   MCHC 33.6 09/10/2013 1412   RDW 12.4 09/10/2013 1412    Hgb A1C Lab Results  Component Value Date   HGBA1C 6.5 09/10/2013   Assessment and Plan:  Right hand stiffness:  Likely arthritis Try Aleve once daily  RTC in 6 months for followup of chronic conditions

## 2014-02-26 NOTE — Assessment & Plan Note (Addendum)
Will repeat A1C today Foot exam today Flu shot today Blurred vision- advised her to schedule an eye exam ASAP Microalbumin 08/2013

## 2014-02-26 NOTE — Assessment & Plan Note (Signed)
Well controlled on Lisinopril 20 mg  Will check CBC and CMET today 

## 2014-02-26 NOTE — Addendum Note (Signed)
Addended by: Roena MaladyEVONTENNO, Jenasia Dolinar Y on: 02/26/2014 09:44 AM   Modules accepted: Orders

## 2014-02-26 NOTE — Patient Instructions (Signed)

## 2014-02-26 NOTE — Assessment & Plan Note (Signed)
Denies issues on Zocor Will check CMET and Lipid profile today LDL goal < 100

## 2014-02-27 ENCOUNTER — Telehealth: Payer: Self-pay | Admitting: Internal Medicine

## 2014-02-27 NOTE — Telephone Encounter (Signed)
emmi mailed  °

## 2014-03-03 ENCOUNTER — Ambulatory Visit
Admission: RE | Admit: 2014-03-03 | Discharge: 2014-03-03 | Disposition: A | Discharge status: Home / Self Care | Payer: Medicare Other | Source: Ambulatory Visit | Attending: Internal Medicine | Admitting: Internal Medicine

## 2014-03-03 DIAGNOSIS — M8580 Other specified disorders of bone density and structure, unspecified site: Secondary | ICD-10-CM

## 2014-03-11 ENCOUNTER — Ambulatory Visit (INDEPENDENT_AMBULATORY_CARE_PROVIDER_SITE_OTHER): Payer: Medicare Other | Admitting: Internal Medicine

## 2014-03-11 ENCOUNTER — Encounter: Payer: Self-pay | Admitting: Internal Medicine

## 2014-03-11 VITALS — BP 132/64 | HR 72 | Temp 98.1°F | Wt 142.5 lb

## 2014-03-11 DIAGNOSIS — M8580 Other specified disorders of bone density and structure, unspecified site: Secondary | ICD-10-CM

## 2014-03-11 DIAGNOSIS — R7401 Elevation of levels of liver transaminase levels: Secondary | ICD-10-CM

## 2014-03-11 DIAGNOSIS — E785 Hyperlipidemia, unspecified: Secondary | ICD-10-CM

## 2014-03-11 DIAGNOSIS — E559 Vitamin D deficiency, unspecified: Secondary | ICD-10-CM

## 2014-03-11 DIAGNOSIS — R740 Nonspecific elevation of levels of transaminase and lactic acid dehydrogenase [LDH]: Secondary | ICD-10-CM

## 2014-03-11 NOTE — Progress Notes (Signed)
Pre visit review using our clinic review tool, if applicable. No additional management support is needed unless otherwise documented below in the visit note. 

## 2014-03-11 NOTE — Patient Instructions (Signed)
Fat and Cholesterol Control Diet Fat and cholesterol levels in your blood and organs are influenced by your diet. High levels of fat and cholesterol may lead to diseases of the heart, small and large blood vessels, gallbladder, liver, and pancreas. CONTROLLING FAT AND CHOLESTEROL WITH DIET Although exercise and lifestyle factors are important, your diet is key. That is because certain foods are known to raise cholesterol and others to lower it. The goal is to balance foods for their effect on cholesterol and more importantly, to replace saturated and trans fat with other types of fat, such as monounsaturated fat, polyunsaturated fat, and omega-3 fatty acids. On average, a person should consume no more than 15 to 17 g of saturated fat daily. Saturated and trans fats are considered "bad" fats, and they will raise LDL cholesterol. Saturated fats are primarily found in animal products such as meats, butter, and cream. However, that does not mean you need to give up all your favorite foods. Today, there are good tasting, low-fat, low-cholesterol substitutes for most of the things you like to eat. Choose low-fat or nonfat alternatives. Choose round or loin cuts of red meat. These types of cuts are lowest in fat and cholesterol. Chicken (without the skin), fish, veal, and ground turkey breast are great choices. Eliminate fatty meats, such as hot dogs and salami. Even shellfish have little or no saturated fat. Have a 3 oz (85 g) portion when you eat lean meat, poultry, or fish. Trans fats are also called "partially hydrogenated oils." They are oils that have been scientifically manipulated so that they are solid at room temperature resulting in a longer shelf life and improved taste and texture of foods in which they are added. Trans fats are found in stick margarine, some tub margarines, cookies, crackers, and baked goods.  When baking and cooking, oils are a great substitute for butter. The monounsaturated oils are  especially beneficial since it is believed they lower LDL and raise HDL. The oils you should avoid entirely are saturated tropical oils, such as coconut and palm.  Remember to eat a lot from food groups that are naturally free of saturated and trans fat, including fish, fruit, vegetables, beans, grains (barley, rice, couscous, bulgur wheat), and pasta (without cream sauces).  IDENTIFYING FOODS THAT LOWER FAT AND CHOLESTEROL  Soluble fiber may lower your cholesterol. This type of fiber is found in fruits such as apples, vegetables such as broccoli, potatoes, and carrots, legumes such as beans, peas, and lentils, and grains such as barley. Foods fortified with plant sterols (phytosterol) may also lower cholesterol. You should eat at least 2 g per day of these foods for a cholesterol lowering effect.  Read package labels to identify low-saturated fats, trans fat free, and low-fat foods at the supermarket. Select cheeses that have only 2 to 3 g saturated fat per ounce. Use a heart-healthy tub margarine that is free of trans fats or partially hydrogenated oil. When buying baked goods (cookies, crackers), avoid partially hydrogenated oils. Breads and muffins should be made from whole grains (whole-wheat or whole oat flour, instead of "flour" or "enriched flour"). Buy non-creamy canned soups with reduced salt and no added fats.  FOOD PREPARATION TECHNIQUES  Never deep-fry. If you must fry, either stir-fry, which uses very little fat, or use non-stick cooking sprays. When possible, broil, bake, or roast meats, and steam vegetables. Instead of putting butter or margarine on vegetables, use lemon and herbs, applesauce, and cinnamon (for squash and sweet potatoes). Use nonfat   yogurt, salsa, and low-fat dressings for salads.  LOW-SATURATED FAT / LOW-FAT FOOD SUBSTITUTES Meats / Saturated Fat (g)  Avoid: Steak, marbled (3 oz/85 g) / 11 g  Choose: Steak, lean (3 oz/85 g) / 4 g  Avoid: Hamburger (3 oz/85 g) / 7  g  Choose: Hamburger, lean (3 oz/85 g) / 5 g  Avoid: Ham (3 oz/85 g) / 6 g  Choose: Ham, lean cut (3 oz/85 g) / 2.4 g  Avoid: Chicken, with skin, dark meat (3 oz/85 g) / 4 g  Choose: Chicken, skin removed, dark meat (3 oz/85 g) / 2 g  Avoid: Chicken, with skin, light meat (3 oz/85 g) / 2.5 g  Choose: Chicken, skin removed, light meat (3 oz/85 g) / 1 g Dairy / Saturated Fat (g)  Avoid: Whole milk (1 cup) / 5 g  Choose: Low-fat milk, 2% (1 cup) / 3 g  Choose: Low-fat milk, 1% (1 cup) / 1.5 g  Choose: Skim milk (1 cup) / 0.3 g  Avoid: Hard cheese (1 oz/28 g) / 6 g  Choose: Skim milk cheese (1 oz/28 g) / 2 to 3 g  Avoid: Cottage cheese, 4% fat (1 cup) / 6.5 g  Choose: Low-fat cottage cheese, 1% fat (1 cup) / 1.5 g  Avoid: Ice cream (1 cup) / 9 g  Choose: Sherbet (1 cup) / 2.5 g  Choose: Nonfat frozen yogurt (1 cup) / 0.3 g  Choose: Frozen fruit bar / trace  Avoid: Whipped cream (1 tbs) / 3.5 g  Choose: Nondairy whipped topping (1 tbs) / 1 g Condiments / Saturated Fat (g)  Avoid: Mayonnaise (1 tbs) / 2 g  Choose: Low-fat mayonnaise (1 tbs) / 1 g  Avoid: Butter (1 tbs) / 7 g  Choose: Extra light margarine (1 tbs) / 1 g  Avoid: Coconut oil (1 tbs) / 11.8 g  Choose: Olive oil (1 tbs) / 1.8 g  Choose: Corn oil (1 tbs) / 1.7 g  Choose: Safflower oil (1 tbs) / 1.2 g  Choose: Sunflower oil (1 tbs) / 1.4 g  Choose: Soybean oil (1 tbs) / 2.4 g  Choose: Canola oil (1 tbs) / 1 g Document Released: 03/06/2005 Document Revised: 07/01/2012 Document Reviewed: 06/04/2013 [employer] Patient Information 2015 [employer]. This information is not intended to replace advice given to you by your health care provider. Make sure you discuss any questions you have with your health care provider.  

## 2014-03-11 NOTE — Progress Notes (Signed)
Subjective:    Patient ID: Vicki Perry, female    DOB: 13-Aug-1950, 63 y.o.   MRN: [id number]  HPI  Pt presents to the clinic today to follow up labs. Her Vit D level was low at 25. Her bone density exam showed osteopenia. She was advised to start taking calcium and Vit D OTC. Her sodium was 134 and AST 40. She does not use aspirin or tylenol very frequently. She does not drink alcohol. She has no history of IV drug use. She also wants to know if she can come off her cholesterol medication since her cholesterol looks so good.  Review of Systems      Past Medical History  Diagnosis Date  . Hypertension   . Diabetes mellitus   . High cholesterol   . Anemia     Current Outpatient Prescriptions  Medication Sig Dispense Refill  . ACCU-CHEK COMPACT PLUS test strip CHECK UP TO 3 TIMES DAILY 102 each 1  . ACCU-CHEK SOFTCLIX LANCETS lancets Use as instructed to check blood sugars 3 times per day 100 each 5  . lisinopril (PRINIVIL,ZESTRIL) 20 MG tablet Take 1 tablet (20 mg total) by mouth daily. 90 tablet 0  . metFORMIN (GLUCOPHAGE) 500 MG tablet Take 1 tablet (500 mg total) by mouth 2 (two) times daily with a meal. 60 tablet 5  . simvastatin (ZOCOR) 20 MG tablet TAKE 1 TABLET DAILY 90 tablet 0   No current facility-administered medications for this visit.    Allergies  Allergen Reactions  . Codeine Nausea Only    Family History  Problem Relation Age of Onset  . Diabetes Mother   . Hypertension Mother   . Cancer Mother [id number]    Cervical  . Hypertension Father   . Diabetes Maternal Grandmother   . Hypertension Sister   . Diabetes Sister   . Hypertension Brother   . Heart disease Neg Hx   . Stroke Neg Hx     History   Social History  . Marital Status: Single    Spouse Name: N/A    Number of Children: N/A  . Years of Education: N/A   Occupational History  . Not on file.   Social History Main Topics  . Smoking status: Never Smoker   . Smokeless tobacco: Not on file   . Alcohol Use: No  . Drug Use: No  . Sexual Activity: Not on file   Other Topics Concern  . Not on file   Social History Narrative     Constitutional: Denies fever, malaise, fatigue, headache or abrupt weight changes.  Respiratory: Denies difficulty breathing, shortness of breath, cough or sputum production.   Cardiovascular: Denies chest pain, chest tightness, palpitations or swelling in the hands or feet.  Gastrointestinal: Denies abdominal pain, bloating, constipation, diarrhea or blood in the stool.  Neurological: Denies dizziness, difficulty with memory, difficulty with speech or problems with balance and coordination.   No other specific complaints in a complete review of systems (except as listed in HPI above).  Objective:   Physical Exam   BP 132/64 mmHg  Pulse 72  Temp(Src) 98.1 F (36.7 C) (Oral)  Wt 142 lb 8 oz (64.638 kg)  SpO2 98% Wt Readings from Last 3 Encounters:  03/11/14 142 lb 8 oz (64.638 kg)  02/26/14 143 lb (64.864 kg)  09/10/13 142 lb (64.411 kg)    General: Appears herstated age, well developed, well nourished in NAD.  Cardiovascular: Normal rate and rhythm. S1,S2 noted.  No  murmur, rubs or gallops noted.  Pulmonary/Chest: Normal effort and positive vesicular breath sounds. No respiratory distress. No wheezes, rales or ronchi noted.  Abdomen: Soft and nontender. Normal bowel sounds, no bruits noted. No distention or masses noted. Liver, spleen and kidneys non palpable. Neurological: Alert and oriented.   BMET    Component Value Date/Time   NA 134* 02/26/2014 0900   K 4.1 02/26/2014 0900   CL 104 02/26/2014 0900   CO2 25 02/26/2014 0900   GLUCOSE 91 02/26/2014 0900   BUN 10 02/26/2014 0900   CREATININE 1.0 02/26/2014 0900   CALCIUM 8.6 02/26/2014 0900    Lipid Panel     Component Value Date/Time   CHOL 117 02/26/2014 0900   TRIG 67.0 02/26/2014 0900   HDL 41.20 02/26/2014 0900   CHOLHDL 3 02/26/2014 0900   VLDL 13.4 02/26/2014 0900    LDLCALC 62 02/26/2014 0900    CBC    Component Value Date/Time   WBC 6.3 02/26/2014 0900   RBC 4.20 02/26/2014 0900   HGB 11.8* 02/26/2014 0900   HCT 36.1 02/26/2014 0900   PLT 230.0 02/26/2014 0900   MCV 85.9 02/26/2014 0900   MCHC 32.7 02/26/2014 0900   RDW 12.5 02/26/2014 0900    Hgb A1C Lab Results  Component Value Date   HGBA1C 6.4 02/26/2014        Assessment & Plan:   Vit D deficiency:  She is starting 2000 units Vit D OTC Will also take a Vitamin supplement  Elevated AST:  Nonspecific Only mildly elevated Not high risk for liver disease and other LFT normal Will check CMET in 3 months  HLD:  She wants to stop her Zocor Will hold x 3 months Will repeat CMET and Lipid profile in 3 months If elevated, will restart Zocor Handout given on low fat diet  RTC in 6 months for follow up

## 2014-05-02 ENCOUNTER — Other Ambulatory Visit: Payer: Self-pay | Admitting: Internal Medicine

## 2014-05-04 ENCOUNTER — Other Ambulatory Visit: Payer: Self-pay | Admitting: *Deleted

## 2014-05-04 MED ORDER — ACCU-CHEK SOFTCLIX LANCETS MISC
Status: DC
Start: 2014-05-04 — End: 2016-08-23

## 2014-06-10 ENCOUNTER — Other Ambulatory Visit: Payer: Medicare Other

## 2014-07-01 ENCOUNTER — Other Ambulatory Visit: Payer: Self-pay | Admitting: Internal Medicine

## 2014-07-13 ENCOUNTER — Other Ambulatory Visit (INDEPENDENT_AMBULATORY_CARE_PROVIDER_SITE_OTHER): Payer: Medicare Other

## 2014-07-13 DIAGNOSIS — E785 Hyperlipidemia, unspecified: Secondary | ICD-10-CM

## 2014-07-13 LAB — COMPREHENSIVE METABOLIC PANEL
ALT: 10 U/L (ref 0–35)
AST: 17 U/L (ref 0–37)
Albumin: 3.8 g/dL (ref 3.5–5.2)
Alkaline Phosphatase: 77 U/L (ref 39–117)
BUN: 13 mg/dL (ref 6–23)
CO2: 28 mEq/L (ref 19–32)
CREATININE: 0.94 mg/dL (ref 0.40–1.20)
Calcium: 9 mg/dL (ref 8.4–10.5)
Chloride: 106 mEq/L (ref 96–112)
GFR: 76.58 mL/min (ref >60.00)
GLUCOSE: 96 mg/dL (ref 70–99)
Potassium: 4.2 mEq/L (ref 3.5–5.1)
SODIUM: 138 meq/L (ref 135–145)
Total Bilirubin: 0.5 mg/dL (ref 0.2–1.2)
Total Protein: 6.6 g/dL (ref 6.0–8.3)

## 2014-07-13 LAB — LIPID PANEL
Cholesterol: 168 mg/dL (ref 0–200)
HDL: 38.9 mg/dL — ABNORMAL LOW (ref >39.00)
LDL Cholesterol: 114 mg/dL — ABNORMAL HIGH (ref 0–99)
NonHDL: 129.1
Total CHOL/HDL Ratio: 4
Triglycerides: 78 mg/dL (ref 0.0–149.0)
VLDL: 15.6 mg/dL (ref 0.0–40.0)

## 2014-08-31 ENCOUNTER — Ambulatory Visit: Payer: Medicare Other | Admitting: Internal Medicine

## 2014-09-03 ENCOUNTER — Other Ambulatory Visit: Payer: Self-pay

## 2014-09-03 NOTE — Telephone Encounter (Signed)
Last filled 08/2013 with 5 refills--is pt supposed to take medication?--please advise

## 2014-09-04 NOTE — Telephone Encounter (Signed)
Pt states she has been taking medication only QD not BID--but the bottle does say BID--please advise

## 2014-09-04 NOTE — Telephone Encounter (Signed)
Call pt:  In my last note, I wrote that she is taking Metformin 500 mg once daily. Verify with pt if she is taking it 1 or 2 times a day. Also, she should be due for followup.

## 2014-09-07 MED ORDER — METFORMIN HCL 500 MG PO TABS: 500.0000 mg | ORAL_TABLET | Freq: Every day | ORAL | Status: DC

## 2014-09-07 NOTE — Telephone Encounter (Signed)
Changed sig to once daily, refilled per request

## 2014-09-15 ENCOUNTER — Other Ambulatory Visit: Payer: Self-pay | Admitting: Internal Medicine

## 2014-09-15 ENCOUNTER — Encounter: Payer: Self-pay | Admitting: Internal Medicine

## 2014-09-15 ENCOUNTER — Ambulatory Visit (INDEPENDENT_AMBULATORY_CARE_PROVIDER_SITE_OTHER): Payer: Medicare Other | Admitting: Internal Medicine

## 2014-09-15 VITALS — BP 130/78 | HR 56 | Temp 97.8°F | Wt 139.0 lb

## 2014-09-15 DIAGNOSIS — Z23 Encounter for immunization: Secondary | ICD-10-CM | POA: Diagnosis not present

## 2014-09-15 DIAGNOSIS — E119 Type 2 diabetes mellitus without complications: Secondary | ICD-10-CM | POA: Diagnosis not present

## 2014-09-15 DIAGNOSIS — E7800 Pure hypercholesterolemia, unspecified: Secondary | ICD-10-CM

## 2014-09-15 DIAGNOSIS — I10 Essential (primary) hypertension: Secondary | ICD-10-CM

## 2014-09-15 LAB — COMPREHENSIVE METABOLIC PANEL
ALT: 12 U/L (ref 0–35)
AST: 20 U/L (ref 0–37)
Albumin: 4.1 g/dL (ref 3.5–5.2)
Alkaline Phosphatase: 77 U/L (ref 39–117)
BILIRUBIN TOTAL: 0.6 mg/dL (ref 0.2–1.2)
BUN: 10 mg/dL (ref 6–23)
CALCIUM: 9.4 mg/dL (ref 8.4–10.5)
CO2: 29 meq/L (ref 19–32)
CREATININE: 1.04 mg/dL (ref 0.40–1.20)
Chloride: 106 mEq/L (ref 96–112)
GFR: 68.11 mL/min (ref >60.00)
Glucose, Bld: 91 mg/dL (ref 70–99)
Potassium: 4.5 mEq/L (ref 3.5–5.1)
Sodium: 141 mEq/L (ref 135–145)
Total Protein: 7 g/dL (ref 6.0–8.3)

## 2014-09-15 LAB — CBC
HCT: 36.9 % (ref 36.0–46.0)
Hemoglobin: 12.4 g/dL (ref 12.0–15.0)
MCHC: 33.5 g/dL (ref 30.0–36.0)
MCV: 83.2 fl (ref 78.0–100.0)
Platelets: 262 10*3/uL (ref 150.0–400.0)
RBC: 4.44 Mil/uL (ref 3.87–5.11)
RDW: 12.8 % (ref 11.5–15.5)
WBC: 6.9 10*3/uL (ref 4.0–10.5)

## 2014-09-15 LAB — HEMOGLOBIN A1C: Hgb A1c MFr Bld: 6 % (ref 4.6–6.5)

## 2014-09-15 NOTE — Assessment & Plan Note (Signed)
LDL not at goal She will restart Zocor Will recheck Lipid profile at her next visit

## 2014-09-15 NOTE — Assessment & Plan Note (Signed)
Well controlled on Lisinopril Will check CBC and CMET today 

## 2014-09-15 NOTE — Patient Instructions (Addendum)
You are getting a Prevnar today Start taking your Zocor daily Make an appt for your eye exam We are checking labs today, will send you a letter with the results  Basic Carbohydrate Counting for Diabetes Mellitus Carbohydrate counting is a method for keeping track of the amount of carbohydrates you eat. Eating carbohydrates naturally increases the level of sugar (glucose) in your blood, so it is important for you to know the amount that is okay for you to have in every meal. Carbohydrate counting helps keep the level of glucose in your blood within normal limits. The amount of carbohydrates allowed is different for every person. A dietitian can help you calculate the amount that is right for you. Once you know the amount of carbohydrates you can have, you can count the carbohydrates in the foods you want to eat. Carbohydrates are found in the following foods:  Grains, such as breads and cereals.  Dried beans and soy products.  Starchy vegetables, such as potatoes, peas, and corn.  Fruit and fruit juices.  Milk and yogurt.  Sweets and snack foods, such as cake, cookies, candy, chips, soft drinks, and fruit drinks. CARBOHYDRATE COUNTING There are two ways to count the carbohydrates in your food. You can use either of the methods or a combination of both. Reading the "Nutrition Facts" on Packaged Food The "Nutrition Facts" is an area that is included on the labels of almost all packaged food and beverages in the [place]. It includes the serving size of that food or beverage and information about the nutrients in each serving of the food, including the grams (g) of carbohydrate per serving.  Decide the number of servings of this food or beverage that you will be able to eat or drink. Multiply that number of servings by the number of grams of carbohydrate that is listed on the label for that serving. The total will be the amount of carbohydrates you will be having when you eat or drink this  food or beverage. Learning Standard Serving Sizes of Food When you eat food that is not packaged or does not include "Nutrition Facts" on the label, you need to measure the servings in order to count the amount of carbohydrates.A serving of most carbohydrate-rich foods contains about 15 g of carbohydrates. The following list includes serving sizes of carbohydrate-rich foods that provide 15 g ofcarbohydrate per serving:   1 slice of bread (1 oz) or 1 six-inch tortilla.    of a hamburger bun or English muffin.  4-6 crackers.   cup unsweetened dry cereal.    cup hot cereal.   cup rice or pasta.    cup mashed potatoes or  of a large baked potato.  1 cup fresh fruit or one small piece of fruit.    cup canned or frozen fruit or fruit juice.  1 cup milk.   cup plain fat-free yogurt or yogurt sweetened with artificial sweeteners.   cup cooked dried beans or starchy vegetable, such as peas, corn, or potatoes.  Decide the number of standard-size servings that you will eat. Multiply that number of servings by 15 (the grams of carbohydrates in that serving). For example, if you eat 2 cups of strawberries, you will have eaten 2 servings and 30 g of carbohydrates (2 servings x 15 g = 30 g). For foods such as soups and casseroles, in which more than one food is mixed in, you will need to count the carbohydrates in each food that is included.  EXAMPLE OF CARBOHYDRATE COUNTING Sample Dinner  3 oz chicken breast.   cup of brown rice.   cup of corn.  1 cup milk.   1 cup strawberries with sugar-free whipped topping.  Carbohydrate Calculation Step 1: Identify the foods that contain carbohydrates:   Rice.   Corn.   Milk.   Strawberries. Step 2:Calculate the number of servings eaten of each:   2 servings of rice.   1 serving of corn.   1 serving of milk.   1 serving of strawberries. Step 3: Multiply each of those number of servings by 15 g:   2  servings of rice x 15 g = 30 g.   1 serving of corn x 15 g = 15 g.   1 serving of milk x 15 g = 15 g.   1 serving of strawberries x 15 g = 15 g. Step 4: Add together all of the amounts to find the total grams of carbohydrates eaten: 30 g + 15 g + 15 g + 15 g = 75 g. Document Released: 03/06/2005 Document Revised: 07/21/2013 Document Reviewed: 01/31/2013 [facility] Patient Information 2015 [place], [place]. This information is not intended to replace advice given to you by your health care provider. Make sure you discuss any questions you have with your health care provider.

## 2014-09-15 NOTE — Progress Notes (Signed)
Pre visit review using our clinic review tool, if applicable. No additional management support is needed unless otherwise documented below in the visit note. 

## 2014-09-15 NOTE — Progress Notes (Signed)
Subjective:    Patient ID: Vicki Perry, female    DOB: 1950-12-14, 64 y.o.   MRN: [id number]  HPI  Pt presents to the clinic today for 6 month follow up of chronic medical condtions.  HLD: She stopped taking her Zocor 02/2014. We rechecked cholesterol 06/2014. Her LDL was 114. She has been trying to consume a low fat diet.  HTN: BP well controlled on Lisinopril. She denies adverse effects. Her BP today is 130/78.  DM2: Her last A1C 02/2014 was 6.4%. She is on Metformin 500 mg daily in the am. Her sugars range 108-111 fasting. Eye exam 04/2013. Flu 02/2014. Pneumovax 07/12/12. Prevnar never. She has had some numbness and tingling in her feet.  Review of Systems  Past Medical History  Diagnosis Date  . Hypertension   . Diabetes mellitus   . High cholesterol   . Anemia     Current Outpatient Prescriptions  Medication Sig Dispense Refill  . ACCU-CHEK SOFTCLIX LANCETS lancets Use as instructed to check blood sugars 3 times per day 100 each 1  . glucose blood (ACCU-CHEK AVIVA PLUS) test strip 1 each by Other route 3 (three) times daily. 100 each 6  . lisinopril (PRINIVIL,ZESTRIL) 20 MG tablet TAKE 1 TABLET BY MOUTH DAILY 90 tablet 0  . metFORMIN (GLUCOPHAGE) 500 MG tablet Take 1 tablet (500 mg total) by mouth daily with breakfast. 30 tablet 1  . simvastatin (ZOCOR) 20 MG tablet TAKE 1 TABLET DAILY 90 tablet 0   No current facility-administered medications for this visit.    Allergies  Allergen Reactions  . Codeine Nausea Only    Family History  Problem Relation Age of Onset  . Diabetes Mother   . Hypertension Mother   . Cancer Mother 13    Cervical  . Hypertension Father   . Diabetes Maternal Grandmother   . Hypertension Sister   . Diabetes Sister   . Hypertension Brother   . Heart disease Neg Hx   . Stroke Neg Hx     History   Social History  . Marital Status: Single    Spouse Name: N/A  . Number of Children: N/A  . Years of Education: N/A   Occupational  History  . Not on file.   Social History Main Topics  . Smoking status: Never Smoker   . Smokeless tobacco: Not on file  . Alcohol Use: No  . Drug Use: No  . Sexual Activity: Not on file   Other Topics Concern  . Not on file   Social History Narrative     Constitutional: Denies fever, malaise, fatigue, headache or abrupt weight changes.  Respiratory: Denies difficulty breathing, shortness of breath, cough or sputum production.   Cardiovascular: Denies chest pain, chest tightness, palpitations or swelling in the hands or feet.  Skin: Denies redness, rashes, lesions or ulcercations.  Neurological: Pt reports numbness and tingling in her feet. Denies dizziness, difficulty with memory, difficulty with speech or problems with balance and coordination.   No other specific complaints in a complete review of systems (except as listed in HPI above).     Objective:   Physical Exam   BP 130/78 mmHg  Pulse 56  Temp(Src) 97.8 F (36.6 C) (Oral)  Wt 139 lb (63.05 kg)  SpO2 98% Wt Readings from Last 3 Encounters:  09/15/14 139 lb (63.05 kg)  03/11/14 142 lb 8 oz (64.638 kg)  02/26/14 143 lb (64.864 kg)    General: Appears her stated age, well developed,  well nourished in NAD. Skin: Warm, dry and intact. No rashes, lesions or ulcerations noted. HEENT: Head: normal shape and size; Eyes: sclera white, no icterus, conjunctiva pink, PERRLA and EOMs intact;  Cardiovascular: Normal rate and rhythm. S1,S2 noted.  No murmur, rubs or gallops noted. No JVD or BLE edema. No carotid bruits noted. Pulmonary/Chest: Normal effort and positive vesicular breath sounds. No respiratory distress. No wheezes, rales or ronchi noted.  Neurological: Alert and oriented. Sensation intact to BLE.   BMET    Component Value Date/Time   NA 138 07/13/2014 0903   K 4.2 07/13/2014 0903   CL 106 07/13/2014 0903   CO2 28 07/13/2014 0903   GLUCOSE 96 07/13/2014 0903   BUN 13 07/13/2014 0903   CREATININE 0.94  07/13/2014 0903   CALCIUM 9.0 07/13/2014 0903    Lipid Panel     Component Value Date/Time   CHOL 168 07/13/2014 0903   TRIG 78.0 07/13/2014 0903   HDL 38.90* 07/13/2014 0903   CHOLHDL 4 07/13/2014 0903   VLDL 15.6 07/13/2014 0903   LDLCALC 114* 07/13/2014 0903    CBC    Component Value Date/Time   WBC 6.3 02/26/2014 0900   RBC 4.20 02/26/2014 0900   HGB 11.8* 02/26/2014 0900   HCT 36.1 02/26/2014 0900   PLT 230.0 02/26/2014 0900   MCV 85.9 02/26/2014 0900   MCHC 32.7 02/26/2014 0900   RDW 12.5 02/26/2014 0900    Hgb A1C Lab Results  Component Value Date   HGBA1C 6.4 02/26/2014        Assessment & Plan:

## 2014-09-15 NOTE — Addendum Note (Signed)
Addended by: Roena MaladyEVONTENNO, Callista Hoh Y on: 09/15/2014 11:54 AM   Modules accepted: Orders

## 2014-09-15 NOTE — Assessment & Plan Note (Signed)
Will check A1C today No microalbumin as she is on ACEI Flu and Pneumovax UTD Prevnar today Advised her to schedule an appt for her eye exam and send me a copy of the results Foot exam today Handout given on low carb diet

## 2014-09-17 NOTE — Addendum Note (Signed)
Addended by: Roena MaladyEVONTENNO, Hollyann Pablo Y on: 09/17/2014 04:55 PM   Modules accepted: Orders

## 2014-10-26 ENCOUNTER — Other Ambulatory Visit: Payer: Self-pay | Admitting: Internal Medicine

## 2014-10-27 ENCOUNTER — Other Ambulatory Visit: Payer: Self-pay | Admitting: Internal Medicine

## 2014-10-28 ENCOUNTER — Other Ambulatory Visit: Payer: Self-pay

## 2014-10-28 MED ORDER — METFORMIN HCL 500 MG PO TABS: 500.0000 mg | ORAL_TABLET | Freq: Every day | ORAL | Status: DC

## 2014-12-09 ENCOUNTER — Other Ambulatory Visit: Payer: Self-pay

## 2014-12-09 MED ORDER — SIMVASTATIN 20 MG PO TABS: 20.0000 mg | ORAL_TABLET | Freq: Every day | ORAL | Status: DC

## 2014-12-09 NOTE — Telephone Encounter (Signed)
[facility]; pt seen 09/15/14 and refill done per protocol.

## 2014-12-15 ENCOUNTER — Ambulatory Visit (INDEPENDENT_AMBULATORY_CARE_PROVIDER_SITE_OTHER): Payer: Medicare Other | Admitting: Internal Medicine

## 2014-12-15 ENCOUNTER — Encounter: Payer: Self-pay | Admitting: Internal Medicine

## 2014-12-15 VITALS — BP 142/80 | HR 78 | Temp 98.6°F | Wt 137.0 lb

## 2014-12-15 DIAGNOSIS — H6123 Impacted cerumen, bilateral: Secondary | ICD-10-CM | POA: Diagnosis not present

## 2014-12-15 DIAGNOSIS — J069 Acute upper respiratory infection, unspecified: Secondary | ICD-10-CM | POA: Diagnosis not present

## 2014-12-15 MED ORDER — AZITHROMYCIN 250 MG PO TABS: ORAL_TABLET | ORAL | Status: DC

## 2014-12-15 NOTE — Progress Notes (Signed)
Pre visit review using our clinic review tool, if applicable. No additional management support is needed unless otherwise documented below in the visit note. 

## 2014-12-15 NOTE — Patient Instructions (Signed)
Cough, Adult  A cough is a reflex that helps clear your throat and airways. It can help heal the body or may be a reaction to an irritated airway. A cough may only last 2 or 3 weeks (acute) or may last more than 8 weeks (chronic).  CAUSES Acute cough:  Viral or bacterial infections. Chronic cough:  Infections.  Allergies.  Asthma.  Post-nasal drip.  Smoking.  Heartburn or acid reflux.  Some medicines.  Chronic lung problems (COPD).  Cancer. SYMPTOMS   Cough.  Fever.  Chest pain.  Increased breathing rate.  High-pitched whistling sound when breathing (wheezing).  Colored mucus that you cough up (sputum). TREATMENT   A bacterial cough may be treated with antibiotic medicine.  A viral cough must run its course and will not respond to antibiotics.  Your caregiver may recommend other treatments if you have a chronic cough. HOME CARE INSTRUCTIONS   Only take over-the-counter or prescription medicines for pain, discomfort, or fever as directed by your caregiver. Use cough suppressants only as directed by your caregiver.  Use a cold steam vaporizer or humidifier in your bedroom or home to help loosen secretions.  Sleep in a semi-upright position if your cough is worse at night.  Rest as needed.  Stop smoking if you smoke. SEEK IMMEDIATE MEDICAL CARE IF:   You have pus in your sputum.  Your cough starts to worsen.  You cannot control your cough with suppressants and are losing sleep.  You begin coughing up blood.  You have difficulty breathing.  You develop pain which is getting worse or is uncontrolled with medicine.  You have a fever. MAKE SURE YOU:   Understand these instructions.  Will watch your condition.  Will get help right away if you are not doing well or get worse. Document Released: 09/02/2010 Document Revised: 05/29/2011 Document Reviewed: 09/02/2010 [employer] Patient Information 2015 [employer]. This information is not intended  to replace advice given to you by your health care provider. Make sure you discuss any questions you have with your health care provider.  

## 2014-12-15 NOTE — Progress Notes (Signed)
HPI  Pt presents to the clinic today with c/o runny nose, ear fullness, sore throat and cough. This started 2 weeks ago. She is blowing clear mucous out of her nose. She has had some bilateral hearing loss. The cough is productive of white mucous. She is not sure if she has been running fevers but has had chills and body aches. She has taken Sudafed and Catering manager with minimal relief. She has no history of allergies or breathing problems. She has not had sick contacts.  Review of Systems      Past Medical History  Diagnosis Date  . Hypertension   . Diabetes mellitus   . High cholesterol   . Anemia     Family History  Problem Relation Age of Onset  . Diabetes Mother   . Hypertension Mother   . Cancer Mother 75    Cervical  . Hypertension Father   . Diabetes Maternal Grandmother   . Hypertension Sister   . Diabetes Sister   . Hypertension Brother   . Heart disease Neg Hx   . Stroke Neg Hx     Social History   Social History  . Marital Status: Single    Spouse Name: N/A  . Number of Children: N/A  . Years of Education: N/A   Occupational History  . Not on file.   Social History Main Topics  . Smoking status: Never Smoker   . Smokeless tobacco: Not on file  . Alcohol Use: No  . Drug Use: No  . Sexual Activity: Not on file   Other Topics Concern  . Not on file   Social History Narrative    Allergies  Allergen Reactions  . Codeine Nausea Only     Constitutional: Positive headache, fatigue. Denies fever or abrupt weight changes.  HEENT:  Positive runny nose, sore throat. Denies eye redness, eye pain, pressure behind the eyes, facial pain, nasal congestion, ear pain, ringing in the ears, wax buildup, or bloody nose. Respiratory: Positive cough. Denies difficulty breathing or shortness of breath.  Cardiovascular: Denies chest pain, chest tightness, palpitations or swelling in the hands or feet.   No other specific complaints in a complete review of systems  (except as listed in HPI above).  Objective:   BP 142/80 mmHg  Pulse 78  Temp(Src) 98.6 F (37 C) (Oral)  Wt 137 lb (62.143 kg)  SpO2 98% Wt Readings from Last 3 Encounters:  12/15/14 137 lb (62.143 kg)  09/15/14 139 lb (63.05 kg)  03/11/14 142 lb 8 oz (64.638 kg)     General: Appears her stated age, in NAD. HEENT: Head: normal shape and size, no sinus tenderness noted; Eyes: sclera white, no icterus, conjunctiva pink; Ears: bilateral cerumen impaction; Nose: mucosa pink and moist, septum midline; Throat/Mouth: Teeth present, mucosa pink and moist, no exudate noted, no lesions or ulcerations noted.  Neck: No cervical lymphadenopathy.  Cardiovascular: Normal rate and rhythm. S1,S2 noted.  No murmur, rubs or gallops noted.  Pulmonary/Chest: Normal effort and coarse vesicular breath sounds. No respiratory distress. No wheezes, rales or ronchi noted.      Assessment & Plan:   Upper Respiratory Infection:  Get some rest and drink plenty of water Do salt water gargles for the sore throat eRx for Azithromax x 5 days Delsym OTC for cough  Bilateral Cerumen Impaction:  Manual lavage by CMA Discussed use of Debrox to prevent wax buildup  RTC as needed or if symptoms persist.

## 2014-12-27 ENCOUNTER — Other Ambulatory Visit: Payer: Self-pay | Admitting: Internal Medicine

## 2014-12-30 ENCOUNTER — Other Ambulatory Visit: Payer: Self-pay

## 2014-12-30 DIAGNOSIS — Z1231 Encounter for screening mammogram for malignant neoplasm of breast: Secondary | ICD-10-CM

## 2015-02-15 ENCOUNTER — Ambulatory Visit
Admission: RE | Admit: 2015-02-15 | Discharge: 2015-02-15 | Disposition: A | Discharge status: Home / Self Care | Payer: Medicare Other | Source: Ambulatory Visit

## 2015-02-15 DIAGNOSIS — Z1231 Encounter for screening mammogram for malignant neoplasm of breast: Secondary | ICD-10-CM

## 2015-03-02 ENCOUNTER — Ambulatory Visit (INDEPENDENT_AMBULATORY_CARE_PROVIDER_SITE_OTHER): Payer: Medicare Other | Admitting: Internal Medicine

## 2015-03-02 ENCOUNTER — Encounter: Payer: Self-pay | Admitting: Internal Medicine

## 2015-03-02 ENCOUNTER — Other Ambulatory Visit: Payer: Self-pay | Admitting: Internal Medicine

## 2015-03-02 VITALS — BP 136/74 | HR 60 | Temp 98.1°F | Ht 60.5 in | Wt 137.0 lb

## 2015-03-02 DIAGNOSIS — E7800 Pure hypercholesterolemia, unspecified: Secondary | ICD-10-CM | POA: Diagnosis not present

## 2015-03-02 DIAGNOSIS — K219 Gastro-esophageal reflux disease without esophagitis: Secondary | ICD-10-CM | POA: Insufficient documentation

## 2015-03-02 DIAGNOSIS — I10 Essential (primary) hypertension: Secondary | ICD-10-CM

## 2015-03-02 DIAGNOSIS — Z23 Encounter for immunization: Secondary | ICD-10-CM | POA: Diagnosis not present

## 2015-03-02 DIAGNOSIS — Z0000 Encounter for general adult medical examination without abnormal findings: Secondary | ICD-10-CM | POA: Diagnosis not present

## 2015-03-02 DIAGNOSIS — E119 Type 2 diabetes mellitus without complications: Secondary | ICD-10-CM

## 2015-03-02 DIAGNOSIS — J309 Allergic rhinitis, unspecified: Secondary | ICD-10-CM

## 2015-03-02 LAB — LIPID PANEL
CHOLESTEROL: 183 mg/dL (ref 0–200)
HDL: 49 mg/dL (ref >39.00)
LDL CALC: 115 mg/dL — AB (ref 0–99)
NonHDL: 134.4
Total CHOL/HDL Ratio: 4
Triglycerides: 99 mg/dL (ref 0.0–149.0)
VLDL: 19.8 mg/dL (ref 0.0–40.0)

## 2015-03-02 LAB — CBC
HEMATOCRIT: 38.8 % (ref 36.0–46.0)
Hemoglobin: 12.8 g/dL (ref 12.0–15.0)
MCHC: 32.9 g/dL (ref 30.0–36.0)
MCV: 84.4 fl (ref 78.0–100.0)
PLATELETS: 295 10*3/uL (ref 150.0–400.0)
RBC: 4.6 Mil/uL (ref 3.87–5.11)
RDW: 12.9 % (ref 11.5–15.5)
WBC: 8.6 10*3/uL (ref 4.0–10.5)

## 2015-03-02 LAB — COMPREHENSIVE METABOLIC PANEL
ALT: 14 U/L (ref 0–35)
AST: 22 U/L (ref 0–37)
Albumin: 4.3 g/dL (ref 3.5–5.2)
Alkaline Phosphatase: 81 U/L (ref 39–117)
BUN: 14 mg/dL (ref 6–23)
CALCIUM: 9.5 mg/dL (ref 8.4–10.5)
CO2: 29 mEq/L (ref 19–32)
Chloride: 101 mEq/L (ref 96–112)
Creatinine, Ser: 1.12 mg/dL (ref 0.40–1.20)
GFR: 62.44 mL/min (ref >60.00)
GLUCOSE: 93 mg/dL (ref 70–99)
POTASSIUM: 4.6 meq/L (ref 3.5–5.1)
Sodium: 138 mEq/L (ref 135–145)
Total Bilirubin: 0.7 mg/dL (ref 0.2–1.2)
Total Protein: 7 g/dL (ref 6.0–8.3)

## 2015-03-02 LAB — HEMOGLOBIN A1C: HEMOGLOBIN A1C: 6.2 % (ref 4.6–6.5)

## 2015-03-02 MED ORDER — OMEPRAZOLE 20 MG PO CPDR: 20.0000 mg | DELAYED_RELEASE_CAPSULE | Freq: Every day | ORAL | Status: DC

## 2015-03-02 MED ORDER — SIMVASTATIN 20 MG PO TABS: 20.0000 mg | ORAL_TABLET | Freq: Every day | ORAL | Status: DC

## 2015-03-02 NOTE — Progress Notes (Signed)
Pre visit review using our clinic review tool, if applicable. No additional management support is needed unless otherwise documented below in the visit note. 

## 2015-03-02 NOTE — Progress Notes (Signed)
HPI:  Pt presents to the clinic today for her Medicare Wellness Exam. She is also due for follow up of chronic conditions.  HLD: She stopped taking her Zocor 02/2014. We rechecked cholesterol 06/2014. Her LDL was 114. She has been trying to consume a low fat diet.  HTN: BP well controlled on Lisinopril. She denies adverse effects. Her BP today is 136/74.  DM2: Her last A1C 08/2014 was 6.0%. She is on Metformin 500 mg daily in the am. Her sugars range 108-111 fasting. Eye exam 04/2013, scheduled for 02/2015. Flu 02/2014. Pneumovax 07/12/12. Prevnar 08/2014. She has had some numbness and tingling in her feet  She also c/o sneezing, sore throat and cough. This started 2-3 days ago. The cough is nonproductive. She denies fever, chills, nausea or shortness of breath. She has taken Robitussin without relief. She has no history of allergies or breathing problems. She has not had sick contacts that she is aware of.  Past Medical History  Diagnosis Date  . Hypertension   . Diabetes mellitus   . High cholesterol   . Anemia     Current Outpatient Prescriptions  Medication Sig Dispense Refill  . ACCU-CHEK SOFTCLIX LANCETS lancets Use as instructed to check blood sugars 3 times per day 100 each 1  . glucose blood (ACCU-CHEK AVIVA PLUS) test strip 1 each by Other route 3 (three) times daily. 100 each 6  . lisinopril (PRINIVIL,ZESTRIL) 20 MG tablet TAKE 1 TABLET BY MOUTH DAILY 90 tablet 1  . metFORMIN (GLUCOPHAGE) 500 MG tablet Take 1 tablet (500 mg total) by mouth daily with breakfast. 30 tablet 4  . simvastatin (ZOCOR) 20 MG tablet Take 1 tablet (20 mg total) by mouth daily. 90 tablet 2   No current facility-administered medications for this visit.    Allergies  Allergen Reactions  . Codeine Nausea Only    Family History  Problem Relation Age of Onset  . Diabetes Mother   . Hypertension Mother   . Cancer Mother 79    Cervical  . Hypertension Father   . Diabetes Maternal Grandmother   .  Hypertension Sister   . Diabetes Sister   . Hypertension Brother   . Heart disease Neg Hx   . Stroke Neg Hx     Social History   Social History  . Marital Status: Single    Spouse Name: N/A  . Number of Children: N/A  . Years of Education: N/A   Occupational History  . Not on file.   Social History Main Topics  . Smoking status: Never Smoker   . Smokeless tobacco: Not on file  . Alcohol Use: No  . Drug Use: No  . Sexual Activity: Not on file   Other Topics Concern  . Not on file   Social History Narrative    Hospitiliaztions: None  Health Maintenance:    Flu:02/2015  Tetanus:08/2013  Pneumovax:06/2012  Prevnar: 08/2014  Zostavax: never, does not want  Mammogram: 01/2015  Pap Smear: 2012  Bone Density: 02/2014  Colon Screening: 2011  Eye Doctor: yearly, scheduled 03/10/2015  Dental Exam: biannually   Providers:   PCP: Nicki Reaper, NP-C     I have personally reviewed and have noted:  1. The patient's medical and social history 2. Their use of alcohol, tobacco or illicit drugs 3. Their current medications and supplements 4. The patient's functional ability including ADL's, fall risks, home safety  risks and hearing or visual impairment. 5. Diet and physical activities 6. Evidence for depression  or mood disorder  Subjective:   Review of Systems:   Constitutional: Denies fever, malaise, fatigue, headache or abrupt weight changes.  HEENT: Pt reports runny nose, sore throat. Denies nasal congestion, ear pain or bloody nose. Respiratory: Pt reports cough. Denies difficulty breathing, shortness of breath, or sputum production.   Cardiovascular: Denies chest pain, chest tightness, palpitations or swelling in the hands or feet.  Gastrointestinal: Pt reports reflux. Denies constipation, diarrhea or blood in her stool. Skin: Denies redness, rashes, lesions or ulcercations.  Neurological: Pt reports numbness and tingling in her feet. Denies dizziness,  difficulty with memory, difficulty with speech or problems with balance and coordination.    No other specific complaints in a complete review of systems (except as listed in HPI above).  Objective:  PE:   BP 136/74 mmHg  Pulse 60  Temp(Src) 98.1 F (36.7 C) (Oral)  Ht 5' 0.5" (1.537 m)  Wt 137 lb (62.143 kg)  BMI 26.31 kg/m2  SpO2 99%  Wt Readings from Last 3 Encounters:  12/15/14 137 lb (62.143 kg)  09/15/14 139 lb (63.05 kg)  03/11/14 142 lb 8 oz (64.638 kg)   General: Appears her stated age, well developed, well nourished in NAD. Skin: Warm, dry and intact. No rashes, lesions or ulcerations noted. HEENT: Head: normal shape and size; Eyes: sclera white, no icterus, conjunctiva pink, PERRLA and EOMs intact; Ears: TM's gray and intact, normal light reflex. Nose: mucosa pink and moist, + PND. Neck: No adenopathy noted.  Cardiovascular: Normal rate and rhythm. S1,S2 noted.  No murmur, rubs or gallops noted. No JVD or BLE edema. No carotid bruits noted. Pulmonary/Chest: Normal effort and positive vesicular breath sounds. No respiratory distress. No wheezes, rales or ronchi noted.  Abdomen: Soft, nontender. Active bowel sounds. No distention or masses noted. Neurological: Alert and oriented. Sensation intact to BLE.   BMET    Component Value Date/Time   NA 141 09/15/2014 1028   K 4.5 09/15/2014 1028   CL 106 09/15/2014 1028   CO2 29 09/15/2014 1028   GLUCOSE 91 09/15/2014 1028   BUN 10 09/15/2014 1028   CREATININE 1.04 09/15/2014 1028   CALCIUM 9.4 09/15/2014 1028    Lipid Panel     Component Value Date/Time   CHOL 168 07/13/2014 0903   TRIG 78.0 07/13/2014 0903   HDL 38.90* 07/13/2014 0903   CHOLHDL 4 07/13/2014 0903   VLDL 15.6 07/13/2014 0903   LDLCALC 114* 07/13/2014 0903    CBC    Component Value Date/Time   WBC 6.9 09/15/2014 1028   RBC 4.44 09/15/2014 1028   HGB 12.4 09/15/2014 1028   HCT 36.9 09/15/2014 1028   PLT 262.0 09/15/2014 1028   MCV 83.2  09/15/2014 1028   MCHC 33.5 09/15/2014 1028   RDW 12.8 09/15/2014 1028    Hgb A1C Lab Results  Component Value Date   HGBA1C 6.0 09/15/2014      Assessment and Plan:   Medicare Annual Wellness Visit:  Diet: Diabetic diet [facility] Depression/mood screen: Negative Hearing: Intact to whispered voice Visual acuity: Grossly normal, performs annual eye exam  ADLs: Capable Fall risk: None Home safety: Good Cognitive evaluation: Intact to orientation, naming, recall and repetition EOL planning: No adv directives (information given), full code/ I agree  Preventative Medicine:  Flu shot today She declines Zostovax All other HM UTD  Next appointment: 6 month follow up of chronic conditions  Allergic Rhinitis:  Take Allegra  daily x 2 weeks Delsym as needed for cough

## 2015-03-02 NOTE — Assessment & Plan Note (Signed)
Controlled on Lisinopril CMET today ECG from 02/2014 reviewed

## 2015-03-02 NOTE — Patient Instructions (Signed)

## 2015-03-02 NOTE — Assessment & Plan Note (Signed)
Start Prilosec daily.

## 2015-03-02 NOTE — Assessment & Plan Note (Signed)
Will check A1C and Lipid Profile today No microalbumin secondary to ACEI Foot exam today Encouraged her to consume a low carb, low cholesterol diet Encouraged her to continue yearly eye exam Flu, Pneumovax and Prevner UTD Continue Metformin

## 2015-03-02 NOTE — Assessment & Plan Note (Signed)
Will repeat Lipid profile and CMET today Continue Zocor Encoraged her to consume a low fat diet Advised her to start taking a baby ASA daily.

## 2015-03-03 LAB — HIV ANTIBODY (ROUTINE TESTING W REFLEX): HIV 1&2 Ab, 4th Generation: NONREACTIVE

## 2015-03-03 LAB — HEPATITIS C ANTIBODY: HCV Ab: NEGATIVE

## 2015-03-04 MED ORDER — SIMVASTATIN 40 MG PO TABS: 40.0000 mg | ORAL_TABLET | Freq: Every day | ORAL | Status: DC

## 2015-03-04 MED ORDER — METFORMIN HCL 500 MG PO TABS: 500.0000 mg | ORAL_TABLET | Freq: Every day | ORAL | Status: DC

## 2015-03-04 NOTE — Addendum Note (Signed)
Addended by: Roena MaladyEVONTENNO, MELANIE Y on: 03/04/2015 02:38 PM   Modules accepted: Orders, Medications

## 2015-03-17 ENCOUNTER — Other Ambulatory Visit: Payer: Medicare Other

## 2015-03-24 ENCOUNTER — Encounter: Payer: Medicare Other | Admitting: Internal Medicine

## 2015-04-01 ENCOUNTER — Encounter: Payer: Medicare Other | Admitting: Internal Medicine

## 2015-05-21 ENCOUNTER — Other Ambulatory Visit: Payer: Self-pay | Admitting: Internal Medicine

## 2015-06-01 ENCOUNTER — Ambulatory Visit (INDEPENDENT_AMBULATORY_CARE_PROVIDER_SITE_OTHER): Payer: Medicare Other | Admitting: Internal Medicine

## 2015-06-01 ENCOUNTER — Encounter: Payer: Self-pay | Admitting: Internal Medicine

## 2015-06-01 VITALS — BP 130/76 | HR 57 | Temp 98.2°F | Wt 138.0 lb

## 2015-06-01 DIAGNOSIS — H6123 Impacted cerumen, bilateral: Secondary | ICD-10-CM

## 2015-06-01 DIAGNOSIS — J0100 Acute maxillary sinusitis, unspecified: Secondary | ICD-10-CM

## 2015-06-01 MED ORDER — AMOXICILLIN 500 MG PO CAPS: 500.0000 mg | ORAL_CAPSULE | Freq: Three times a day (TID) | ORAL | Status: DC

## 2015-06-01 NOTE — Patient Instructions (Signed)

## 2015-06-01 NOTE — Progress Notes (Signed)
Pre visit review using our clinic review tool, if applicable. No additional management support is needed unless otherwise documented below in the visit note. 

## 2015-06-01 NOTE — Progress Notes (Signed)
HPI  Pt presents to the clinic today with c/o headache, facial pain and pressure, ear fullness, nasal congestion and cough. This started 2 weeks ago. She is blowing blood tinged clear mucous out of her nose. The cough is productive of clear mucous. She denies fever, chills or body aches. She has been taking Alka Seltzer, Tussin, Claritin with minimal relief. She has ahistory of seasonal allergies but reports this feels different. She has had sick contacts. She has had her flu shot.   Review of Systems    Past Medical History  Diagnosis Date  . Hypertension   . Diabetes mellitus   . High cholesterol   . Anemia     Family History  Problem Relation Age of Onset  . Diabetes Mother   . Hypertension Mother   . Cancer Mother 73    Cervical  . Hypertension Father   . Diabetes Maternal Grandmother   . Hypertension Sister   . Diabetes Sister   . Hypertension Brother   . Heart disease Neg Hx   . Stroke Neg Hx     Social History   Social History  . Marital Status: Single    Spouse Name: N/A  . Number of Children: N/A  . Years of Education: N/A   Occupational History  . Not on file.   Social History Main Topics  . Smoking status: Never Smoker   . Smokeless tobacco: Not on file  . Alcohol Use: No  . Drug Use: No  . Sexual Activity: Not on file   Other Topics Concern  . Not on file   Social History Narrative    Allergies  Allergen Reactions  . Codeine Nausea Only     Constitutional: Positive headache, fatigue. Denies fever or abrupt weight changes.  HEENT:  Positive ear fullness, facial pain, nasal congestion. Denies eye redness, ear pain, ringing in the ears, wax buildup, runny nose or sore throat. Respiratory: Positive cough. Denies difficulty breathing or shortness of breath.  Cardiovascular: Denies chest pain, chest tightness, palpitations or swelling in the hands or feet.   No other specific complaints in a complete review of systems (except as listed in HPI  above).  Objective:  BP 130/76 mmHg  Pulse 57  Temp(Src) 98.2 F (36.8 C) (Oral)  Wt 138 lb (62.596 kg)  SpO2 99%   General: Appears her stated age,  in NAD. HEENT: Head: normal shape and size, maxillary sinus tenderness noted; Eyes: sclera white, no icterus, conjunctiva pink; Ears: bilaterally cerumen impaction; Nose: mucosa pink and moist, septum midline; Throat/Mouth: + PND. Teeth present, mucosa pink and moist, no exudate noted, no lesions or ulcerations noted.  Neck:  No masses, lumps or thyromegaly present.  Cardiovascular: Normal rate and rhythm. S1,S2 noted.  No murmur, rubs or gallops noted.  Pulmonary/Chest: Normal effort and positive vesicular breath sounds. No respiratory distress. No wheezes, rales or ronchi noted.      Assessment & Plan:   Acute viral maxillary sinusitis  Can use a Neti Pot which can be purchased from your local drug store. Flonase 2 sprays each nostril for 3 days and then as needed. Continue Zyrtec She insists that she is getting worse. RX for Amoxil 500 mg TID prn to start taking on Friday if symptoms persist or worsen but I want her to try a few days of Flonase first  Bilateral Cerumen Impaction:  Manual lavage by CMA Can use Debrox drops 2 x week to prevent wax buildup  RTC in 3 months  to follow up chronic conditions  RTC as needed or if symptoms persist.

## 2015-09-01 ENCOUNTER — Other Ambulatory Visit: Payer: Self-pay | Admitting: Internal Medicine

## 2015-10-06 ENCOUNTER — Other Ambulatory Visit: Payer: Self-pay

## 2015-10-06 MED ORDER — SIMVASTATIN 40 MG PO TABS
40.0000 mg | ORAL_TABLET | Freq: Every day | ORAL | Status: DC
Start: 2015-10-06 — End: 2016-01-04

## 2015-11-10 LAB — HM DIABETES EYE EXAM

## 2015-11-11 ENCOUNTER — Encounter: Payer: Self-pay | Admitting: Internal Medicine

## 2016-01-04 ENCOUNTER — Other Ambulatory Visit: Payer: Self-pay | Admitting: Internal Medicine

## 2016-01-12 ENCOUNTER — Other Ambulatory Visit: Payer: Self-pay | Admitting: Internal Medicine

## 2016-01-12 DIAGNOSIS — Z1231 Encounter for screening mammogram for malignant neoplasm of breast: Secondary | ICD-10-CM

## 2016-01-13 ENCOUNTER — Other Ambulatory Visit: Payer: Self-pay | Admitting: Internal Medicine

## 2016-01-13 MED ORDER — LISINOPRIL 20 MG PO TABS: 20.0000 mg | ORAL_TABLET | Freq: Every day | ORAL | 0 refills | Status: DC

## 2016-01-13 MED ORDER — METFORMIN HCL 500 MG PO TABS: 500.0000 mg | ORAL_TABLET | Freq: Every day | ORAL | 0 refills | Status: DC

## 2016-02-16 ENCOUNTER — Ambulatory Visit: Payer: Medicare Other

## 2016-03-09 ENCOUNTER — Other Ambulatory Visit: Payer: Self-pay | Admitting: Internal Medicine

## 2016-03-09 ENCOUNTER — Ambulatory Visit
Admission: RE | Admit: 2016-03-09 | Discharge: 2016-03-09 | Disposition: A | Discharge status: Home / Self Care | Payer: Medicare Other | Source: Ambulatory Visit | Attending: Internal Medicine | Admitting: Internal Medicine

## 2016-03-09 DIAGNOSIS — N644 Mastodynia: Secondary | ICD-10-CM

## 2016-03-09 DIAGNOSIS — Z1231 Encounter for screening mammogram for malignant neoplasm of breast: Secondary | ICD-10-CM

## 2016-03-16 ENCOUNTER — Ambulatory Visit (INDEPENDENT_AMBULATORY_CARE_PROVIDER_SITE_OTHER): Payer: Medicare Other | Admitting: Internal Medicine

## 2016-03-16 ENCOUNTER — Encounter: Payer: Self-pay | Admitting: Internal Medicine

## 2016-03-16 VITALS — BP 148/80 | HR 61 | Temp 98.0°F | Ht 61.0 in | Wt 141.8 lb

## 2016-03-16 DIAGNOSIS — I10 Essential (primary) hypertension: Secondary | ICD-10-CM

## 2016-03-16 DIAGNOSIS — E7800 Pure hypercholesterolemia, unspecified: Secondary | ICD-10-CM

## 2016-03-16 DIAGNOSIS — D508 Other iron deficiency anemias: Secondary | ICD-10-CM

## 2016-03-16 DIAGNOSIS — H6122 Impacted cerumen, left ear: Secondary | ICD-10-CM

## 2016-03-16 DIAGNOSIS — E119 Type 2 diabetes mellitus without complications: Secondary | ICD-10-CM

## 2016-03-16 DIAGNOSIS — K219 Gastro-esophageal reflux disease without esophagitis: Secondary | ICD-10-CM

## 2016-03-16 DIAGNOSIS — Z13820 Encounter for screening for osteoporosis: Secondary | ICD-10-CM

## 2016-03-16 DIAGNOSIS — Z23 Encounter for immunization: Secondary | ICD-10-CM

## 2016-03-16 DIAGNOSIS — Z0000 Encounter for general adult medical examination without abnormal findings: Secondary | ICD-10-CM | POA: Diagnosis not present

## 2016-03-16 DIAGNOSIS — M8580 Other specified disorders of bone density and structure, unspecified site: Secondary | ICD-10-CM | POA: Diagnosis not present

## 2016-03-16 LAB — COMPREHENSIVE METABOLIC PANEL
ALT: 18 U/L (ref 0–35)
AST: 24 U/L (ref 0–37)
Albumin: 4.2 g/dL (ref 3.5–5.2)
Alkaline Phosphatase: 74 U/L (ref 39–117)
BILIRUBIN TOTAL: 0.6 mg/dL (ref 0.2–1.2)
BUN: 12 mg/dL (ref 6–23)
CO2: 30 meq/L (ref 19–32)
CREATININE: 1.02 mg/dL (ref 0.40–1.20)
Calcium: 9.2 mg/dL (ref 8.4–10.5)
Chloride: 106 mEq/L (ref 96–112)
GFR: 69.34 mL/min (ref >60.00)
GLUCOSE: 101 mg/dL — AB (ref 70–99)
Potassium: 4.4 mEq/L (ref 3.5–5.1)
Sodium: 140 mEq/L (ref 135–145)
Total Protein: 7 g/dL (ref 6.0–8.3)

## 2016-03-16 LAB — LIPID PANEL
CHOL/HDL RATIO: 2
Cholesterol: 111 mg/dL (ref 0–200)
HDL: 45.2 mg/dL (ref >39.00)
LDL Cholesterol: 54 mg/dL (ref 0–99)
NONHDL: 65.86
Triglycerides: 59 mg/dL (ref 0.0–149.0)
VLDL: 11.8 mg/dL (ref 0.0–40.0)

## 2016-03-16 LAB — VITAMIN D 25 HYDROXY (VIT D DEFICIENCY, FRACTURES): VITD: 24.66 ng/mL — AB (ref 30.00–100.00)

## 2016-03-16 LAB — CBC
HCT: 35.7 % — ABNORMAL LOW (ref 36.0–46.0)
Hemoglobin: 12.3 g/dL (ref 12.0–15.0)
MCHC: 34.3 g/dL (ref 30.0–36.0)
MCV: 82.9 fl (ref 78.0–100.0)
PLATELETS: 282 10*3/uL (ref 150.0–400.0)
RBC: 4.31 Mil/uL (ref 3.87–5.11)
RDW: 12.6 % (ref 11.5–15.5)
WBC: 7.9 10*3/uL (ref 4.0–10.5)

## 2016-03-16 LAB — HEMOGLOBIN A1C: Hgb A1c MFr Bld: 6.2 % (ref 4.6–6.5)

## 2016-03-16 MED ORDER — LISINOPRIL 20 MG PO TABS: 40.0000 mg | ORAL_TABLET | Freq: Every day | ORAL | 0 refills | Status: DC

## 2016-03-16 NOTE — Patient Instructions (Signed)

## 2016-03-16 NOTE — Assessment & Plan Note (Signed)
Uncontrolled, even when she does take it as directed Discussed goal of 120/80 Increase Lisinopril to 40 mg daily CMET today  RTC in 3 weeks to follow up HTN

## 2016-03-16 NOTE — Assessment & Plan Note (Signed)
Encouraged her to consume a low carb, low fat diet A1C today No microalbumin secondary to ACEI therapy Foot exam today Continue yearly eye exams Flu shot today Pneumovax and Prevnar UTD Continue Metformin for now

## 2016-03-16 NOTE — Assessment & Plan Note (Signed)
Discussed avoiding triggers Advised her to that Prilosec is not made to take prn If she wants something to take prn, she should try Zantac 75 mg OTC CMET today

## 2016-03-16 NOTE — Progress Notes (Signed)
HPI:  Pt presents to the clinic today for her Medicare Wellness Exam. She is also due to follow up chronic conditions.  DM 2: Her last A1C was 6.2%, 02/2015. She has been checking her sugars, usually < 150. She is taking Metformin 500 mg daily as prescribed. Her last eye exam was 12/2015. She checks her feet daily. Her immunizations are UTD.  Anemia: Her last H/H was 12.8/38.8. She has not noticed any s/ of bleeding.  HLD: Her last LDL was 115, 02/2015. Her Zocor was increased to 40 mg daily at that time. She denies myalgias. She has been trying to consume a low fat diet.   HTN: She is taking Lisinopril daily as prescribed. Her BP today is 148/80, but she has not taken her BP medications this morning. She reports her BP at home usually runs 130-140 systolic.  ECG from 02/2014 reviewed.  GERD: Triggered by spicy foods and meat. She is taking Prilosec as needed OTC. She has never had an upper GI.   Past Medical History:  Diagnosis Date  . Anemia   . Diabetes mellitus   . High cholesterol   . Hypertension     Current Outpatient Prescriptions  Medication Sig Dispense Refill  . ACCU-CHEK SOFTCLIX LANCETS lancets Use as instructed to check blood sugars 3 times per day 100 each 1  . glucose blood (ACCU-CHEK AVIVA PLUS) test strip 1 each by Other route 3 (three) times daily. 100 each 6  . lisinopril (PRINIVIL,ZESTRIL) 20 MG tablet Take 1 tablet (20 mg total) by mouth daily. 90 tablet 0  . metFORMIN (GLUCOPHAGE) 500 MG tablet Take 1 tablet (500 mg total) by mouth daily with breakfast. 90 tablet 0  . omeprazole (PRILOSEC) 20 MG capsule Take 1 capsule (20 mg total) by mouth daily. 90 capsule 1  . simvastatin (ZOCOR) 40 MG tablet Take 1 tablet (40 mg total) by mouth daily at 6 PM. 90 tablet 0   No current facility-administered medications for this visit.     Allergies  Allergen Reactions  . Codeine Nausea Only    Family History  Problem Relation Age of Onset  . Diabetes Mother   .  Hypertension Mother   . Cancer Mother [id number]    Cervical  . Hypertension Father   . Diabetes Maternal Grandmother   . Hypertension Sister   . Diabetes Sister   . Hypertension Brother   . Heart disease Neg Hx   . Stroke Neg Hx     Social History   Social History  . Marital status: Single    Spouse name: N/A  . Number of children: N/A  . Years of education: N/A   Occupational History  . Not on file.   Social History Main Topics  . Smoking status: Never Smoker  . Smokeless tobacco: Not on file  . Alcohol use No  . Drug use: No  . Sexual activity: Not on file   Other Topics Concern  . Not on file   Social History Narrative  . No narrative on file    Hospitiliaztions: None  Health Maintenance:    Flu: 02/2015  Tetanus: 08/2013  Pneumovax: 06/2012  Prevnar: 08/2014  Zostavax: never  Mammogram: 01/2015, scheduled 03/2016  Pap Smear: 09/2010  Bone Density: 02/2014- osteopenia  Colon Screening: 05/2006  Eye Doctor: 12/2015  Dental Exam: biannually   Providers:   PCP: Nicki Reaperegina Zhaire Locker, NP-C  [facility]   I have personally reviewed and have noted:  1. The patient's  medical and social history 2. Their use of alcohol, tobacco or illicit drugs 3. Their current medications and supplements 4. The patient's functional ability including ADL's, fall risks, home safety risks and hearing or visual impairment. 5. Diet and physical activities 6. Evidence for depression or mood disorder  Subjective:   Review of Systems:   Constitutional: Denies fever, malaise, fatigue, headache or abrupt weight changes.  HEENT: Denies eye pain, eye redness, ear pain, ringing in the ears, wax buildup, runny nose, nasal congestion, bloody nose, or sore throat. Respiratory: Denies difficulty breathing, shortness of breath, cough or sputum production.   Cardiovascular: Denies chest pain, chest tightness, palpitations or swelling in the hands or feet.  Gastrointestinal: Denies  abdominal pain, bloating, constipation, diarrhea or blood in the stool.  GU: Pt reports urinary frequency. Denies urgency, pain with urination, burning sensation, blood in urine, odor or discharge. Musculoskeletal: Pt reports intermittent low back pain. Denies decrease in range of motion, difficulty with gait, muscle pain or joint swelling.  Skin: Denies redness, rashes, lesions or ulcercations.  Neurological: Pt reports intermittent tingling in her feet. Denies dizziness, difficulty with memory, difficulty with speech or problems with balance and coordination.  Psych: Denies anxiety, depression, SI/HI.  No other specific complaints in a complete review of systems (except as listed in HPI above).  Objective:  PE:  BP (!) 148/80   Pulse 61   Temp 98 F (36.7 C) (Oral)   Ht 5\' 1"  (1.549 m)   Wt 141 lb 12 oz (64.3 kg)   SpO2 99%   BMI 26.78 kg/m   Wt Readings from Last 3 Encounters:  06/01/15 138 lb (62.6 kg)  03/02/15 137 lb (62.1 kg)  12/15/14 137 lb (62.1 kg)    General: Appears her stated age, well developed, well nourished in NAD. Skin: Warm, dry and intact.  HEENT: Head: normal shape and size; Eyes: sclera white, no icterus, conjunctiva pink, PERRLA and EOMs intact; Right Ear: Tm's gray and intact, normal light reflex; Left Ear: Cerumen impaction. Throat/Mouth: Teeth present, mucosa pink and moist, no exudate, lesions or ulcerations noted.  Neck: Neck supple, trachea midline. No masses, lumps or thyromegaly present.  Cardiovascular: Normal rate and rhythm. S1,S2 noted.  No murmur, rubs or gallops noted. No JVD or BLE edema. No carotid bruits noted. Pulmonary/Chest: Normal effort and positive vesicular breath sounds. No respiratory distress. No wheezes, rales or ronchi noted.  Abdomen: Soft and nontender. Normal bowel sounds. No distention or masses noted. Liver, spleen and kidneys non palpable. Musculoskeletal: Normal range of motion. No tenderness noted over the spine. Strength  5/5 BUE/BLE. No signs of joint swelling. No difficulty with gait. Neurological: Alert and oriented. Cranial nerves II-XII grossly intact. Coordination normal.  Psychiatric: Mood and affect normal. Behavior is normal. Judgment and thought content normal.     BMET    Component Value Date/Time   NA 138 03/02/2015 1519   K 4.6 03/02/2015 1519   CL 101 03/02/2015 1519   CO2 29 03/02/2015 1519   GLUCOSE 93 03/02/2015 1519   BUN 14 03/02/2015 1519   CREATININE 1.12 03/02/2015 1519   CALCIUM 9.5 03/02/2015 1519    Lipid Panel     Component Value Date/Time   CHOL 183 03/02/2015 1519   TRIG 99.0 03/02/2015 1519   HDL 49.00 03/02/2015 1519   CHOLHDL 4 03/02/2015 1519   VLDL 19.8 03/02/2015 1519   LDLCALC 115 (H) 03/02/2015 1519    CBC    Component Value Date/Time  WBC 8.6 03/02/2015 1519   RBC 4.60 03/02/2015 1519   HGB 12.8 03/02/2015 1519   HCT 38.8 03/02/2015 1519   PLT 295.0 03/02/2015 1519   MCV 84.4 03/02/2015 1519   MCHC 32.9 03/02/2015 1519   RDW 12.9 03/02/2015 1519    Hgb A1C Lab Results  Component Value Date   HGBA1C 6.2 03/02/2015      Assessment and Plan:   Medicare Annual Wellness Visit:  Diet: She has cut back on meat and fried foods. She does eat fruits and veggies daily. She drinks mostly on water and iced tea. Physical activity: She typically walks 3 miles 2-3 times per week. Depression/mood screen: Negative Hearing: Intact to whispered voice Visual acuity: Grossly normal, performs annual eye exam  ADLs: Capable Fall risk: None Home safety: Good Cognitive evaluation: Intact to orientation, naming, recall and repetition EOL planning: No adv directives, full code/ I agree  Preventative Medicine: Flu shot today. Tetanus, pneumovax and prevnar UTD. She declines zostavax. Mammogram already scheduled. Will order bone density, she will have this added on the her mammogram. She declines pap smear to screen for cervical cancer. Colonoscopy due 2018.  Encouraged her to consume a balanced diet and exercise regimen. Advised her to see an eye doctor and dentist annually. Will check, CBC, CMET, Lipid and A1C today.   Next appointment: 3 weeks, follow up HTN  Left Cerumen Impaction:  Manual lavage by CMA Advised her to try Debrox OTC 2 x week to prevent wax buildup  Luverta Korte, NP

## 2016-03-16 NOTE — Assessment & Plan Note (Signed)
Encouraged her to consume a low fat diet CMET and Lipid Profile today Continue Zocor for now

## 2016-03-16 NOTE — Assessment & Plan Note (Signed)
Will check CBC today.  

## 2016-03-17 ENCOUNTER — Encounter: Payer: Self-pay | Admitting: Internal Medicine

## 2016-03-21 ENCOUNTER — Ambulatory Visit
Admission: RE | Admit: 2016-03-21 | Discharge: 2016-03-21 | Disposition: A | Discharge status: Home / Self Care | Payer: Medicare Other | Source: Ambulatory Visit | Attending: Internal Medicine | Admitting: Internal Medicine

## 2016-03-21 DIAGNOSIS — N644 Mastodynia: Secondary | ICD-10-CM

## 2016-03-21 NOTE — Addendum Note (Signed)
Addended by: Roena MaladyEVONTENNO, Kelechi Orgeron Y on: 03/21/2016 08:07 AM   Modules accepted: Orders

## 2016-03-23 ENCOUNTER — Ambulatory Visit
Admission: RE | Admit: 2016-03-23 | Discharge: 2016-03-23 | Disposition: A | Discharge status: Home / Self Care | Payer: Medicare Other | Source: Ambulatory Visit | Attending: Internal Medicine | Admitting: Internal Medicine

## 2016-03-23 DIAGNOSIS — M8580 Other specified disorders of bone density and structure, unspecified site: Secondary | ICD-10-CM

## 2016-03-23 DIAGNOSIS — Z13820 Encounter for screening for osteoporosis: Secondary | ICD-10-CM

## 2016-03-30 ENCOUNTER — Telehealth: Payer: Self-pay | Admitting: Internal Medicine

## 2016-03-30 NOTE — Telephone Encounter (Signed)
Pt is calling regarding her bone density results. Please call pt back, thanks

## 2016-04-09 ENCOUNTER — Other Ambulatory Visit: Payer: Self-pay | Admitting: Internal Medicine

## 2016-04-10 ENCOUNTER — Ambulatory Visit: Payer: Medicare Other | Admitting: Internal Medicine

## 2016-04-12 ENCOUNTER — Encounter (INDEPENDENT_AMBULATORY_CARE_PROVIDER_SITE_OTHER): Payer: Self-pay

## 2016-04-12 ENCOUNTER — Ambulatory Visit (INDEPENDENT_AMBULATORY_CARE_PROVIDER_SITE_OTHER): Payer: Medicare Other | Admitting: Internal Medicine

## 2016-04-12 ENCOUNTER — Encounter: Payer: Self-pay | Admitting: Internal Medicine

## 2016-04-12 VITALS — BP 124/70 | HR 66 | Temp 98.0°F | Wt 142.0 lb

## 2016-04-12 DIAGNOSIS — I10 Essential (primary) hypertension: Secondary | ICD-10-CM | POA: Diagnosis not present

## 2016-04-12 NOTE — Progress Notes (Signed)
Subjective:    Patient ID: Vicki Perry, female    DOB: 1951/03/12, 66 y.o.   MRN: [id number]  HPI  Pt presents to the clinic today for 3 month follow up of HTN. At her last visit, her BP was 148/80. Her Lisinopril was increased to 40 mg daily. She has been taking the medication as prescribed. She denies adverse side effects. Her BP today is 124/70. She has had a little cough but attributes this to a cold she currently has.   Review of Systems  Past Medical History:  Diagnosis Date  . Anemia   . Diabetes mellitus   . High cholesterol   . Hypertension     Current Outpatient Prescriptions  Medication Sig Dispense Refill  . ACCU-CHEK SOFTCLIX LANCETS lancets Use as instructed to check blood sugars 3 times per day 100 each 1  . glucose blood (ACCU-CHEK AVIVA PLUS) test strip 1 each by Other route 3 (three) times daily. 100 each 6  . lisinopril (PRINIVIL,ZESTRIL) 20 MG tablet Take 2 tablets (40 mg total) by mouth daily. 180 tablet 0  . metFORMIN (GLUCOPHAGE) 500 MG tablet Take 1 tablet (500 mg total) by mouth daily with breakfast. 90 tablet 0  . omeprazole (PRILOSEC) 20 MG capsule Take 1 capsule (20 mg total) by mouth daily. (Patient taking differently: Take 20 mg by mouth as needed. ) 90 capsule 1  . simvastatin (ZOCOR) 40 MG tablet TAKE 1 TABLET (40 MG TOTAL) BY MOUTH DAILY AT 6 PM. 90 tablet 3   No current facility-administered medications for this visit.     Allergies  Allergen Reactions  . Codeine Nausea Only    Family History  Problem Relation Age of Onset  . Diabetes Mother   . Hypertension Mother   . Cancer Mother 89    Cervical  . Hypertension Father   . Diabetes Maternal Grandmother   . Hypertension Sister   . Diabetes Sister   . Hypertension Brother   . Heart disease Neg Hx   . Stroke Neg Hx     Social History   Social History  . Marital status: Single    Spouse name: N/A  . Number of children: N/A  . Years of education: N/A   Occupational History    . Not on file.   Social History Main Topics  . Smoking status: Never Smoker  . Smokeless tobacco: Never Used  . Alcohol use No  . Drug use: No  . Sexual activity: Not on file   Other Topics Concern  . Not on file   Social History Narrative  . No narrative on file     Constitutional: Denies fever, malaise, fatigue, headache or abrupt weight changes.  Respiratory: Pt reports cough. Denies difficulty breathing, shortness of breath, or sputum production.   Cardiovascular: Denies chest pain, chest tightness, palpitations or swelling in the hands or feet.  Neurological: Denies dizziness, difficulty with memory, difficulty with speech or problems with balance and coordination.    No other specific complaints in a complete review of systems (except as listed in HPI above).     Objective:   Physical Exam  BP 124/70   Pulse 66   Temp 98 F (36.7 C) (Oral)   Wt 142 lb (64.4 kg)   SpO2 98%   BMI 26.83 kg/m  Wt Readings from Last 3 Encounters:  04/12/16 142 lb (64.4 kg)  03/16/16 141 lb 12 oz (64.3 kg)  06/01/15 138 lb (62.6 kg)  General: Appears her stated age, well developed, well nourished in NAD. Cardiovascular: Normal rate and rhythm. S1,S2 noted.  No murmur, rubs or gallops noted.  Pulmonary/Chest: Normal effort and positive vesicular breath sounds. No respiratory distress. No wheezes, rales or ronchi noted.  Neurological: Alert and oriented.    BMET    Component Value Date/Time   NA 140 03/16/2016 0914   K 4.4 03/16/2016 0914   CL 106 03/16/2016 0914   CO2 30 03/16/2016 0914   GLUCOSE 101 (H) 03/16/2016 0914   BUN 12 03/16/2016 0914   CREATININE 1.02 03/16/2016 0914   CALCIUM 9.2 03/16/2016 0914    Lipid Panel     Component Value Date/Time   CHOL 111 03/16/2016 0914   TRIG 59.0 03/16/2016 0914   HDL 45.20 03/16/2016 0914   CHOLHDL 2 03/16/2016 0914   VLDL 11.8 03/16/2016 0914   LDLCALC 54 03/16/2016 0914    CBC    Component Value Date/Time    WBC 7.9 03/16/2016 0914   RBC 4.31 03/16/2016 0914   HGB 12.3 03/16/2016 0914   HCT 35.7 (L) 03/16/2016 0914   PLT 282.0 03/16/2016 0914   MCV 82.9 03/16/2016 0914   MCHC 34.3 03/16/2016 0914   RDW 12.6 03/16/2016 0914    Hgb A1C Lab Results  Component Value Date   HGBA1C 6.2 03/16/2016            Assessment & Plan:

## 2016-04-12 NOTE — Patient Instructions (Signed)
Hypertension Hypertension, commonly called high blood pressure, is when the force of blood pumping through your arteries is too strong. Your arteries are the blood vessels that carry blood from your heart throughout your body. A blood pressure reading consists of a higher number over a lower number, such as 110/72. The higher number (systolic) is the pressure inside your arteries when your heart pumps. The lower number (diastolic) is the pressure inside your arteries when your heart relaxes. Ideally you want your blood pressure below 120/80. Hypertension forces your heart to work harder to pump blood. Your arteries may become narrow or stiff. Having untreated or uncontrolled hypertension can cause heart attack, stroke, kidney disease, and other problems. What increases the risk? Some risk factors for high blood pressure are controllable. Others are not. Risk factors you cannot control include:  Race. You may be at higher risk if you are African American.  Age. Risk increases with age.  Gender. Men are at higher risk than women before age 45 years. After age 65, women are at higher risk than men. Risk factors you can control include:  Not getting enough exercise or physical activity.  Being overweight.  Getting too much fat, sugar, calories, or salt in your diet.  Drinking too much alcohol. What are the signs or symptoms? Hypertension does not usually cause signs or symptoms. Extremely high blood pressure (hypertensive crisis) may cause headache, anxiety, shortness of breath, and nosebleed. How is this diagnosed? To check if you have hypertension, your health care provider will measure your blood pressure while you are seated, with your arm held at the level of your heart. It should be measured at least twice using the same arm. Certain conditions can cause a difference in blood pressure between your right and left arms. A blood pressure reading that is higher than normal on one occasion does  not mean that you need treatment. If it is not clear whether you have high blood pressure, you may be asked to return on a different day to have your blood pressure checked again. Or, you may be asked to monitor your blood pressure at home for 1 or more weeks. How is this treated? Treating high blood pressure includes making lifestyle changes and possibly taking medicine. Living a healthy lifestyle can help lower high blood pressure. You may need to change some of your habits. Lifestyle changes may include:  Following the DASH diet. This diet is high in fruits, vegetables, and whole grains. It is low in salt, red meat, and added sugars.  Keep your sodium intake below 2,300 mg per day.  Getting at least 30-45 minutes of aerobic exercise at least 4 times per week.  Losing weight if necessary.  Not smoking.  Limiting alcoholic beverages.  Learning ways to reduce stress. Your health care provider may prescribe medicine if lifestyle changes are not enough to get your blood pressure under control, and if one of the following is true:  You are 18-59 years of age and your systolic blood pressure is above 140.  You are 60 years of age or older, and your systolic blood pressure is above 150.  Your diastolic blood pressure is above 90.  You have diabetes, and your systolic blood pressure is over 140 or your diastolic blood pressure is over 90.  You have kidney disease and your blood pressure is above 140/90.  You have heart disease and your blood pressure is above 140/90. Your personal target blood pressure may vary depending on your medical   conditions, your age, and other factors. Follow these instructions at home:  Have your blood pressure rechecked as directed by your health care provider.  Take medicines only as directed by your health care provider. Follow the directions carefully. Blood pressure medicines must be taken as prescribed. The medicine does not work as well when you skip  doses. Skipping doses also puts you at risk for problems.  Do not smoke.  Monitor your blood pressure at home as directed by your health care provider. Contact a health care provider if:  You think you are having a reaction to medicines taken.  You have recurrent headaches or feel dizzy.  You have swelling in your ankles.  You have trouble with your vision. Get help right away if:  You develop a severe headache or confusion.  You have unusual weakness, numbness, or feel faint.  You have severe chest or abdominal pain.  You vomit repeatedly.  You have trouble breathing. This information is not intended to replace advice given to you by your health care provider. Make sure you discuss any questions you have with your health care provider. Document Released: 03/06/2005 Document Revised: 08/12/2015 Document Reviewed: 12/27/2012 Elsevier Interactive Patient Education  2017 [employer]  

## 2016-04-12 NOTE — Assessment & Plan Note (Signed)
Improved on Lisinopril 40 mg daily Will monitor

## 2016-06-01 ENCOUNTER — Other Ambulatory Visit: Payer: Self-pay | Admitting: Internal Medicine

## 2016-06-01 MED ORDER — LISINOPRIL 40 MG PO TABS: 40.0000 mg | ORAL_TABLET | Freq: Every day | ORAL | 1 refills | Status: DC

## 2016-06-02 ENCOUNTER — Other Ambulatory Visit: Payer: Self-pay | Admitting: Internal Medicine

## 2016-08-21 ENCOUNTER — Other Ambulatory Visit: Payer: Self-pay | Admitting: *Deleted

## 2016-08-21 MED ORDER — GLUCOSE BLOOD VI STRP: 1.0000 | ORAL_STRIP | Freq: Every day | 6 refills | Status: DC

## 2016-08-21 NOTE — Telephone Encounter (Signed)
Patient called stating that she needs a new script for her accu-chek aviva plus test strips. Patient stated that she has not had these filled in 1-2 years. Patient stated that the script shows 3 times a day, but she has only has been checking her blood sugar once a day.  Please confirm directions  [facility]

## 2016-08-23 ENCOUNTER — Other Ambulatory Visit: Payer: Self-pay

## 2016-08-23 MED ORDER — ONETOUCH VERIO W/DEVICE KIT: 1.0000 | PACK | Freq: Once | 0 refills | Status: AC

## 2016-08-23 MED ORDER — LANCETS 30G MISC: 1.0000 | Freq: Every day | 5 refills | Status: DC

## 2016-08-23 MED ORDER — GLUCOSE BLOOD VI STRP: 1.0000 | ORAL_STRIP | Freq: Every day | 5 refills | Status: DC

## 2016-08-24 ENCOUNTER — Other Ambulatory Visit: Payer: Self-pay | Admitting: *Deleted

## 2016-08-24 MED ORDER — ONETOUCH ULTRA 2 W/DEVICE KIT: PACK | 0 refills | Status: DC

## 2016-08-24 MED ORDER — GLUCOSE BLOOD VI STRP: ORAL_STRIP | 12 refills | Status: DC

## 2016-08-24 MED ORDER — ONETOUCH ULTRASOFT LANCETS MISC: 12 refills | Status: DC

## 2016-08-24 NOTE — Telephone Encounter (Signed)
Pharmacy left vm at triage stating accucheck not covered by pts pharmacy. Requesting new Rx sent for One touch ultra supplies; sent as requested

## 2016-08-28 ENCOUNTER — Other Ambulatory Visit: Payer: Self-pay | Admitting: Internal Medicine

## 2016-10-13 ENCOUNTER — Encounter: Payer: Self-pay | Admitting: Internal Medicine

## 2016-10-13 ENCOUNTER — Ambulatory Visit (INDEPENDENT_AMBULATORY_CARE_PROVIDER_SITE_OTHER): Payer: Medicare Other | Admitting: Internal Medicine

## 2016-10-13 VITALS — BP 128/78 | HR 66 | Temp 98.2°F | Wt 138.0 lb

## 2016-10-13 DIAGNOSIS — B9789 Other viral agents as the cause of diseases classified elsewhere: Secondary | ICD-10-CM

## 2016-10-13 DIAGNOSIS — J069 Acute upper respiratory infection, unspecified: Secondary | ICD-10-CM | POA: Diagnosis not present

## 2016-10-13 MED ORDER — PROMETHAZINE-DM 6.25-15 MG/5ML PO SYRP: 5.0000 mL | ORAL_SOLUTION | Freq: Four times a day (QID) | ORAL | 0 refills | Status: DC | PRN

## 2016-10-13 NOTE — Patient Instructions (Addendum)
Upper Respiratory Infection, Adult Most upper respiratory infections (URIs) are caused by a virus. A URI affects the nose, throat, and upper air passages. The most common type of URI is often called "the common cold." Follow these instructions at home:  Take medicines only as told by your doctor.  Gargle warm saltwater or take cough drops to comfort your throat as told by your doctor.  Use a warm mist humidifier or inhale steam from a shower to increase air moisture. This may make it easier to breathe.  Drink enough fluid to keep your pee (urine) clear or pale yellow.  Eat soups and other clear broths.  Have a healthy diet.  Rest as needed.  Go back to work when your fever is gone or your doctor says it is okay. ? You may need to stay home longer to avoid giving your URI to others. ? You can also wear a face mask and wash your hands often to prevent spread of the virus.  Use your inhaler more if you have asthma.  Do not use any tobacco products, including cigarettes, chewing tobacco, or electronic cigarettes. If you need help quitting, ask your doctor. Contact a doctor if:  You are getting worse, not better.  Your symptoms are not helped by medicine.  You have chills.  You are getting more short of breath.  You have brown or red mucus.  You have yellow or brown discharge from your nose.  You have pain in your face, especially when you bend forward.  You have a fever.  You have puffy (swollen) neck glands.  You have pain while swallowing.  You have white areas in the back of your throat. Get help right away if:  You have very bad or constant: ? Headache. ? Ear pain. ? Pain in your forehead, behind your eyes, and over your cheekbones (sinus pain). ? Chest pain.  You have long-lasting (chronic) lung disease and any of the following: ? Wheezing. ? Long-lasting cough. ? Coughing up blood. ? A change in your usual mucus.  You have a stiff neck.  You have  changes in your: ? Vision. ? Hearing. ? Thinking. ? Mood. This information is not intended to replace advice given to you by your health care provider. Make sure you discuss any questions you have with your health care provider. Document Released: 08/23/2007 Document Revised: 11/07/2015 Document Reviewed: 06/11/2013 Elsevier Interactive Patient Education  2018 [employer]  

## 2016-10-13 NOTE — Progress Notes (Signed)
HPI  Pt presents to the clinic today with c/o nasal congestion, ear fullness and cough. She reports this started 3 weeks ago. She is blowing clear mucous out of her nose. She denies ear pain or decreased hearing. The cough is productive of clear mucous. She denies sore throat or shortness of breath. She denies fever, chills or body aches. She has tried Robitussin with minimal relief. She has no history of allergies. She has had sick contacts.  Review of Systems     Past Medical History:  Diagnosis Date  . Anemia   . Diabetes mellitus   . High cholesterol   . Hypertension     Family History  Problem Relation Age of Onset  . Diabetes Mother   . Hypertension Mother   . Cancer Mother [id number]       Cervical  . Hypertension Father   . Diabetes Maternal Grandmother   . Hypertension Sister   . Diabetes Sister   . Hypertension Brother   . Heart disease Neg Hx   . Stroke Neg Hx     Social History   Social History  . Marital status: Single    Spouse name: N/A  . Number of children: N/A  . Years of education: N/A   Occupational History  . Not on file.   Social History Main Topics  . Smoking status: Never Smoker  . Smokeless tobacco: Never Used  . Alcohol use No  . Drug use: No  . Sexual activity: Not on file   Other Topics Concern  . Not on file   Social History Narrative  . No narrative on file    Allergies  Allergen Reactions  . Codeine Nausea Only     Constitutional:  Denies headache, fatigue, fever or abrupt weight changes.  HEENT:  Positive nasal congestiont. Denies eye redness, ear pain, ringing in the ears, wax buildup, runny nose or sore throat. Respiratory: Positive cough. Denies difficulty breathing or shortness of breath.  Cardiovascular: Denies chest pain, chest tightness, palpitations or swelling in the hands or feet.   No other specific complaints in a complete review of systems (except as listed in HPI above).  Objective:   BP 128/78   Pulse 66    Temp 98.2 F (36.8 C) (Oral)   Wt 138 lb (62.6 kg)   SpO2 98%   BMI 26.07 kg/m   General: Appears her stated age, well developed, well nourished in NAD. HEENT: Head: normal shape and size, no sinus tenderness noted; Ears: Tm's gray and intact, normal light reflex; Nose: mucosa boggy and moist, septum midline; Throat/Mouth: + PND. Teeth present, mucosa erythematous and moist, no exudate noted, no lesions or ulcerations noted.  Neck:  No adenopathy noted.  Pulmonary/Chest: Normal effort and positive vesicular breath sounds. No respiratory distress. No wheezes, rales or ronchi noted.       Assessment & Plan:   Viral URI with Cough:  Start Zyrtec OTC Flonase 2 sprays each nostril for 3 days and then daily as needed. eRx for Promethazine DM cough syrup  RTC as needed or if symptoms persist. Nicki ReaperBAITY, Neli Fofana, NP

## 2016-11-29 ENCOUNTER — Other Ambulatory Visit: Payer: Self-pay | Admitting: Internal Medicine

## 2016-12-04 ENCOUNTER — Other Ambulatory Visit: Payer: Self-pay | Admitting: Internal Medicine

## 2017-01-15 ENCOUNTER — Encounter: Payer: Self-pay | Admitting: Podiatry

## 2017-01-15 ENCOUNTER — Ambulatory Visit (INDEPENDENT_AMBULATORY_CARE_PROVIDER_SITE_OTHER): Payer: Medicare Other

## 2017-01-15 ENCOUNTER — Ambulatory Visit (INDEPENDENT_AMBULATORY_CARE_PROVIDER_SITE_OTHER): Payer: Medicare Other | Admitting: Podiatry

## 2017-01-15 VITALS — BP 155/71 | HR 59 | Resp 16

## 2017-01-15 DIAGNOSIS — M722 Plantar fascial fibromatosis: Secondary | ICD-10-CM

## 2017-01-15 MED ORDER — DICLOFENAC SODIUM 75 MG PO TBEC: 75.0000 mg | DELAYED_RELEASE_TABLET | Freq: Two times a day (BID) | ORAL | 2 refills | Status: DC

## 2017-01-15 MED ORDER — TRIAMCINOLONE ACETONIDE 10 MG/ML IJ SUSP
10.0000 mg | Freq: Once | INTRAMUSCULAR | Status: AC
  Administered 2017-01-15: 10 mg

## 2017-01-15 NOTE — Patient Instructions (Signed)

## 2017-01-15 NOTE — Progress Notes (Signed)
   Subjective:    Patient ID: Vicki Perry, female    DOB: 1950/11/26, 66 y.o.   MRN: [id number]  HPI    Review of Systems  All other systems reviewed and are negative.      Objective:   Physical Exam        Assessment & Plan:

## 2017-01-17 NOTE — Progress Notes (Signed)
Subjective:    Patient ID: Vicki Perry, female   DOB: 66 y.o.   MRN: [id number]   HPI patient presents with pain in the bottom of the heels of both feet that's been going on for a while and seeming to get worse. Also mild discomfort on the outside that it seems like she's walking differently which is part of which she is experiencing. Patient does not smoke currently and likes to be active    Review of Systems  All other systems reviewed and are negative.       Objective:  Physical Exam  Constitutional: She appears well-developed and well-nourished.  Cardiovascular: Intact distal pulses.   Pulmonary/Chest: Effort normal.  Musculoskeletal: Normal range of motion.  Neurological: She is alert.  Skin: Skin is warm.  Nursing note and vitals reviewed.  neurovascular status intact muscle strength adequate range of motion within normal limits with patient having discomfort plantar aspect heel region bilateral with fluid buildup around the medial band. Mild lateral pain which seems to be compensatory in nature with patient having moderate depression of the arch was noted to have good digital perfusion     Assessment:   Inflammatory fasciitis with probable change in gait leading to inflammation and pain      Plan:    H&P condition reviewed and today I went ahead and injected the plantar fascial bilateral 3 Milligan Kenalog 5 mill grams Xylocaine and applied fascial brace bilateral instructed on physical therapy anti-inflammatories and reappoint for us to recheck again in the next 2-3 weeks. X-rays indicate that there is moderate depression of the arch with spur formation but no indications of stress fracture

## 2017-02-05 ENCOUNTER — Ambulatory Visit: Payer: Medicare Other | Admitting: Podiatry

## 2017-02-12 ENCOUNTER — Ambulatory Visit: Payer: Medicare Other | Admitting: Podiatry

## 2017-02-14 ENCOUNTER — Other Ambulatory Visit: Payer: Self-pay | Admitting: Internal Medicine

## 2017-02-14 DIAGNOSIS — Z1231 Encounter for screening mammogram for malignant neoplasm of breast: Secondary | ICD-10-CM

## 2017-02-26 ENCOUNTER — Ambulatory Visit: Payer: Medicare Other | Admitting: Internal Medicine

## 2017-03-07 ENCOUNTER — Other Ambulatory Visit: Payer: Self-pay | Admitting: Internal Medicine

## 2017-03-22 ENCOUNTER — Other Ambulatory Visit: Payer: Self-pay | Admitting: Internal Medicine

## 2017-03-26 ENCOUNTER — Ambulatory Visit
Admission: RE | Admit: 2017-03-26 | Discharge: 2017-03-26 | Disposition: A | Discharge status: Home / Self Care | Payer: Medicare Other | Source: Ambulatory Visit | Attending: Internal Medicine | Admitting: Internal Medicine

## 2017-03-26 DIAGNOSIS — Z1231 Encounter for screening mammogram for malignant neoplasm of breast: Secondary | ICD-10-CM

## 2017-04-30 ENCOUNTER — Other Ambulatory Visit: Payer: Medicare Other

## 2017-05-03 ENCOUNTER — Ambulatory Visit (INDEPENDENT_AMBULATORY_CARE_PROVIDER_SITE_OTHER): Payer: Medicare Other | Admitting: Internal Medicine

## 2017-05-03 ENCOUNTER — Encounter: Payer: Self-pay | Admitting: Internal Medicine

## 2017-05-03 VITALS — BP 146/84 | HR 56 | Temp 97.8°F | Ht 60.75 in | Wt 138.0 lb

## 2017-05-03 DIAGNOSIS — E559 Vitamin D deficiency, unspecified: Secondary | ICD-10-CM

## 2017-05-03 DIAGNOSIS — I10 Essential (primary) hypertension: Secondary | ICD-10-CM | POA: Diagnosis not present

## 2017-05-03 DIAGNOSIS — E7800 Pure hypercholesterolemia, unspecified: Secondary | ICD-10-CM | POA: Diagnosis not present

## 2017-05-03 DIAGNOSIS — Z0000 Encounter for general adult medical examination without abnormal findings: Secondary | ICD-10-CM

## 2017-05-03 DIAGNOSIS — K219 Gastro-esophageal reflux disease without esophagitis: Secondary | ICD-10-CM

## 2017-05-03 DIAGNOSIS — Z23 Encounter for immunization: Secondary | ICD-10-CM | POA: Diagnosis not present

## 2017-05-03 DIAGNOSIS — E119 Type 2 diabetes mellitus without complications: Secondary | ICD-10-CM

## 2017-05-03 DIAGNOSIS — D509 Iron deficiency anemia, unspecified: Secondary | ICD-10-CM

## 2017-05-03 LAB — COMPREHENSIVE METABOLIC PANEL
ALT: 15 U/L (ref 0–35)
AST: 21 U/L (ref 0–37)
Albumin: 4.1 g/dL (ref 3.5–5.2)
Alkaline Phosphatase: 69 U/L (ref 39–117)
BILIRUBIN TOTAL: 0.7 mg/dL (ref 0.2–1.2)
BUN: 15 mg/dL (ref 6–23)
CO2: 29 meq/L (ref 19–32)
Calcium: 9.3 mg/dL (ref 8.4–10.5)
Chloride: 106 mEq/L (ref 96–112)
Creatinine, Ser: 1 mg/dL (ref 0.40–1.20)
GFR: 70.7 mL/min (ref >60.00)
GLUCOSE: 103 mg/dL — AB (ref 70–99)
Potassium: 4.4 mEq/L (ref 3.5–5.1)
Sodium: 140 mEq/L (ref 135–145)
Total Protein: 6.8 g/dL (ref 6.0–8.3)

## 2017-05-03 LAB — CBC
HCT: 36.9 % (ref 36.0–46.0)
HEMOGLOBIN: 12.2 g/dL (ref 12.0–15.0)
MCHC: 33.1 g/dL (ref 30.0–36.0)
MCV: 83.9 fl (ref 78.0–100.0)
Platelets: 282 10*3/uL (ref 150.0–400.0)
RBC: 4.4 Mil/uL (ref 3.87–5.11)
RDW: 12.8 % (ref 11.5–15.5)
WBC: 7 10*3/uL (ref 4.0–10.5)

## 2017-05-03 LAB — VITAMIN D 25 HYDROXY (VIT D DEFICIENCY, FRACTURES): VITD: 20.54 ng/mL — ABNORMAL LOW (ref 30.00–100.00)

## 2017-05-03 LAB — HEMOGLOBIN A1C: Hgb A1c MFr Bld: 6.3 % (ref 4.6–6.5)

## 2017-05-03 LAB — LIPID PANEL
Cholesterol: 127 mg/dL (ref 0–200)
HDL: 47.1 mg/dL (ref >39.00)
LDL CALC: 64 mg/dL (ref 0–99)
NONHDL: 79.62
Total CHOL/HDL Ratio: 3
Triglycerides: 78 mg/dL (ref 0.0–149.0)
VLDL: 15.6 mg/dL (ref 0.0–40.0)

## 2017-05-03 NOTE — Patient Instructions (Signed)
Health Maintenance for Postmenopausal Women Menopause is a normal process in which your reproductive ability comes to an end. This process happens gradually over a span of months to years, usually between the ages of 22 and 9. Menopause is complete when you have missed 12 consecutive menstrual periods. It is important to talk with your health care provider about some of the most common conditions that affect postmenopausal women, such as heart disease, cancer, and bone loss (osteoporosis). Adopting a healthy lifestyle and getting preventive care can help to promote your health and wellness. Those actions can also lower your chances of developing some of these common conditions. What should I know about menopause? During menopause, you may experience a number of symptoms, such as:  Moderate-to-severe hot flashes.  Night sweats.  Decrease in sex drive.  Mood swings.  Headaches.  Tiredness.  Irritability.  Memory problems.  Insomnia.  Choosing to treat or not to treat menopausal changes is an individual decision that you make with your health care provider. What should I know about hormone replacement therapy and supplements? Hormone therapy products are effective for treating symptoms that are associated with menopause, such as hot flashes and night sweats. Hormone replacement carries certain risks, especially as you become older. If you are thinking about using estrogen or estrogen with progestin treatments, discuss the benefits and risks with your health care provider. What should I know about heart disease and stroke? Heart disease, heart attack, and stroke become more likely as you age. This may be due, in part, to the hormonal changes that your body experiences during menopause. These can affect how your body processes dietary fats, triglycerides, and cholesterol. Heart attack and stroke are both medical emergencies. There are many things that you can do to help prevent heart disease  and stroke:  Have your blood pressure checked at least every 1-2 years. High blood pressure causes heart disease and increases the risk of stroke.  If you are 53-22 years old, ask your health care provider if you should take aspirin to prevent a heart attack or a stroke.  Do not use any tobacco products, including cigarettes, chewing tobacco, or electronic cigarettes. If you need help quitting, ask your health care provider.  It is important to eat a healthy diet and maintain a healthy weight. ? Be sure to include plenty of vegetables, fruits, low-fat dairy products, and lean protein. ? Avoid eating foods that are high in solid fats, added sugars, or salt (sodium).  Get regular exercise. This is one of the most important things that you can do for your health. ? Try to exercise for at least 150 minutes each week. The type of exercise that you do should increase your heart rate and make you sweat. This is known as moderate-intensity exercise. ? Try to do strengthening exercises at least twice each week. Do these in addition to the moderate-intensity exercise.  Know your numbers.Ask your health care provider to check your cholesterol and your blood glucose. Continue to have your blood tested as directed by your health care provider.  What should I know about cancer screening? There are several types of cancer. Take the following steps to reduce your risk and to catch any cancer development as early as possible. Breast Cancer  Practice breast self-awareness. ? This means understanding how your breasts normally appear and feel. ? It also means doing regular breast self-exams. Let your health care provider know about any changes, no matter how small.  If you are 40  or older, have a clinician do a breast exam (clinical breast exam or CBE) every year. Depending on your age, family history, and medical history, it may be recommended that you also have a yearly breast X-ray (mammogram).  If you  have a family history of breast cancer, talk with your health care provider about genetic screening.  If you are at high risk for breast cancer, talk with your health care provider about having an MRI and a mammogram every year.  Breast cancer (BRCA) gene test is recommended for women who have family members with BRCA-related cancers. Results of the assessment will determine the need for genetic counseling and BRCA1 and for BRCA2 testing. BRCA-related cancers include these types: ? Breast. This occurs in males or females. ? Ovarian. ? Tubal. This may also be called fallopian tube cancer. ? Cancer of the abdominal or pelvic lining (peritoneal cancer). ? Prostate. ? Pancreatic.  Cervical, Uterine, and Ovarian Cancer Your health care provider may recommend that you be screened regularly for cancer of the pelvic organs. These include your ovaries, uterus, and vagina. This screening involves a pelvic exam, which includes checking for microscopic changes to the surface of your cervix (Pap test).  For women ages 21-65, health care providers may recommend a pelvic exam and a Pap test every three years. For women ages 79-65, they may recommend the Pap test and pelvic exam, combined with testing for human papilloma virus (HPV), every five years. Some types of HPV increase your risk of cervical cancer. Testing for HPV may also be done on women of any age who have unclear Pap test results.  Other health care providers may not recommend any screening for nonpregnant women who are considered low risk for pelvic cancer and have no symptoms. Ask your health care provider if a screening pelvic exam is right for you.  If you have had past treatment for cervical cancer or a condition that could lead to cancer, you need Pap tests and screening for cancer for at least 20 years after your treatment. If Pap tests have been discontinued for you, your risk factors (such as having a new sexual partner) need to be  reassessed to determine if you should start having screenings again. Some women have medical problems that increase the chance of getting cervical cancer. In these cases, your health care provider may recommend that you have screening and Pap tests more often.  If you have a family history of uterine cancer or ovarian cancer, talk with your health care provider about genetic screening.  If you have vaginal bleeding after reaching menopause, tell your health care provider.  There are currently no reliable tests available to screen for ovarian cancer.  Lung Cancer Lung cancer screening is recommended for adults 69-62 years old who are at high risk for lung cancer because of a history of smoking. A yearly low-dose CT scan of the lungs is recommended if you:  Currently smoke.  Have a history of at least 30 pack-years of smoking and you currently smoke or have quit within the past 15 years. A pack-year is smoking an average of one pack of cigarettes per day for one year.  Yearly screening should:  Continue until it has been 15 years since you quit.  Stop if you develop a health problem that would prevent you from having lung cancer treatment.  Colorectal Cancer  This type of cancer can be detected and can often be prevented.  Routine colorectal cancer screening usually begins at  age 42 and continues through age 45.  If you have risk factors for colon cancer, your health care provider may recommend that you be screened at an earlier age.  If you have a family history of colorectal cancer, talk with your health care provider about genetic screening.  Your health care provider may also recommend using home test kits to check for hidden blood in your stool.  A small camera at the end of a tube can be used to examine your colon directly (sigmoidoscopy or colonoscopy). This is done to check for the earliest forms of colorectal cancer.  Direct examination of the colon should be repeated every  5-10 years until age 71. However, if early forms of precancerous polyps or small growths are found or if you have a family history or genetic risk for colorectal cancer, you may need to be screened more often.  Skin Cancer  Check your skin from head to toe regularly.  Monitor any moles. Be sure to tell your health care provider: ? About any new moles or changes in moles, especially if there is a change in a mole's shape or color. ? If you have a mole that is larger than the size of a pencil eraser.  If any of your family members has a history of skin cancer, especially at a young age, talk with your health care provider about genetic screening.  Always use sunscreen. Apply sunscreen liberally and repeatedly throughout the day.  Whenever you are outside, protect yourself by wearing long sleeves, pants, a wide-brimmed hat, and sunglasses.  What should I know about osteoporosis? Osteoporosis is a condition in which bone destruction happens more quickly than new bone creation. After menopause, you may be at an increased risk for osteoporosis. To help prevent osteoporosis or the bone fractures that can happen because of osteoporosis, the following is recommended:  If you are 46-71 years old, get at least 1,000 mg of calcium and at least 600 mg of vitamin D per day.  If you are older than age 55 but younger than age 65, get at least 1,200 mg of calcium and at least 600 mg of vitamin D per day.  If you are older than age 54, get at least 1,200 mg of calcium and at least 800 mg of vitamin D per day.  Smoking and excessive alcohol intake increase the risk of osteoporosis. Eat foods that are rich in calcium and vitamin D, and do weight-bearing exercises several times each week as directed by your health care provider. What should I know about how menopause affects my mental health? Depression may occur at any age, but it is more common as you become older. Common symptoms of depression  include:  Low or sad mood.  Changes in sleep patterns.  Changes in appetite or eating patterns.  Feeling an overall lack of motivation or enjoyment of activities that you previously enjoyed.  Frequent crying spells.  Talk with your health care provider if you think that you are experiencing depression. What should I know about immunizations? It is important that you get and maintain your immunizations. These include:  Tetanus, diphtheria, and pertussis (Tdap) booster vaccine.  Influenza every year before the flu season begins.  Pneumonia vaccine.  Shingles vaccine.  Your health care provider may also recommend other immunizations. This information is not intended to replace advice given to you by your health care provider. Make sure you discuss any questions you have with your health care provider. Document Released: 04/28/2005  Document Revised: 09/24/2015 Document Reviewed: 12/08/2014 Elsevier Interactive Patient Education  2018 [employer]  

## 2017-05-03 NOTE — Progress Notes (Signed)
HPI:  Pt presents to the clinic today for her Medicare Wellness Exam. She is also due to follow up chronic conditions.  Anemia: Her last H/H was 35.7/12.3, 02/2016. She denies any s/s of bleeding. She no longer takes an iron supplement.  DM 2: Her last A1C was 6.2%, 02/2016. She is taking Metformin as prescribed. She does not check her sugars that often. She checks her feet daily. Her last eye exam was 03/2016. She would like her flu shot today. She is also due for her pneumovax. Prevnar UTD.  HLD: Her last LDL was 54, 02/2016. She is taking Simvastatin as prescribed. She denies myalgias. She occassionally eats fried foods.   HTN: Her BP today is 146/84. She is taking her Lisinopril as prescribed, but reports she did not take it today. ECG from 02/2014.  GERD: Intermittent. She is only taking Omeprazole 1 x week, with good relief of symptoms.  Past Medical History:  Diagnosis Date  . Anemia   . Diabetes mellitus   . High cholesterol   . Hypertension     Current Outpatient Medications  Medication Sig Dispense Refill  . Blood Glucose Monitoring Suppl (ONE TOUCH ULTRA 2) w/Device KIT Use as directed to test blood sugar once daily E11.9 1 each 0  . diclofenac (VOLTAREN) 75 MG EC tablet Take 1 tablet (75 mg total) by mouth 2 (two) times daily. 50 tablet 2  . glucose blood (ONE TOUCH ULTRA TEST) test strip Use as directed to test blood sugar once daily E11.9 100 each 12  . Lancets (ONETOUCH ULTRASOFT) lancets Use as directed to test blood sugar once daily E11.9 100 each 12  . lisinopril (PRINIVIL,ZESTRIL) 40 MG tablet Take 1 tablet (40 mg total) by mouth daily. MUST SCHEDULE ANNUAL EXAM 90 tablet 0  . metFORMIN (GLUCOPHAGE) 500 MG tablet TAKE 1 TABLET BY MOUTH EVERY DAY WITH BREAKFAST 90 tablet 0  . omeprazole (PRILOSEC) 20 MG capsule Take 1 capsule (20 mg total) by mouth daily. (Patient taking differently: Take 20 mg by mouth as needed. ) 90 capsule 1  . simvastatin (ZOCOR) 40 MG tablet TAKE  1 TABLET (40 MG TOTAL) BY MOUTH DAILY AT 6 PM. 90 tablet 3   No current facility-administered medications for this visit.     Allergies  Allergen Reactions  . Codeine Nausea Only    Family History  Problem Relation Age of Onset  . Diabetes Mother   . Hypertension Mother   . Cancer Mother 46       Cervical  . Hypertension Father   . Diabetes Maternal Grandmother   . Hypertension Sister   . Diabetes Sister   . Hypertension Brother   . Heart disease Neg Hx   . Stroke Neg Hx     Social History   Socioeconomic History  . Marital status: Single    Spouse name: Not on file  . Number of children: Not on file  . Years of education: Not on file  . Highest education level: Not on file  Social Needs  . Financial resource strain: Not on file  . Food insecurity - worry: Not on file  . Food insecurity - inability: Not on file  . Transportation needs - medical: Not on file  . Transportation needs - non-medical: Not on file  Occupational History  . Not on file  Tobacco Use  . Smoking status: Never Smoker  . Smokeless tobacco: Never Used  Substance and Sexual Activity  . Alcohol use: No  .  Drug use: No  . Sexual activity: Not on file  Other Topics Concern  . Not on file  Social History Narrative  . Not on file    Hospitiliaztions: None  Health Maintenance:    Flu: 02/2016  Tetanus: 08/2013  Pneumovax: 06/2012  Prevnar: 08/2014  Zostavax: never  Mammogram: 03/2017  Pap Smear: 09/2010  Bone Density: 03/2016  Colon Screening: 05/2006  Eye Doctor: 12/2016, [place] Exam: biannually   Providers:   PCP: Webb Silversmith, NP -C  Podiatry: Dr. Paulla Dolly   I have personally reviewed and have noted:  1. The patient's medical and social history 2. Their use of alcohol, tobacco or illicit drugs 3. Their current medications and supplements 4. The patient's functional ability including ADL's, fall risks, home safety risks and hearing or visual impairment. 5. Diet and  physical activities 6. Evidence for depression or mood disorder  Subjective:   Review of Systems:   Constitutional: Denies fever, malaise, fatigue, headache or abrupt weight changes.  HEENT: Denies eye pain, eye redness, ear pain, ringing in the ears, wax buildup, runny nose, nasal congestion, bloody nose, or sore throat. Respiratory: Denies difficulty breathing, shortness of breath, cough or sputum production.   Cardiovascular: Denies chest pain, chest tightness, palpitations or swelling in the hands or feet.  Gastrointestinal: Pt reports intermittent constipation. Denies abdominal pain, bloating, diarrhea or blood in the stool.  GU: Denies urgency, frequency, pain with urination, burning sensation, blood in urine, odor or discharge. Musculoskeletal: Denies decrease in range of motion, difficulty with gait, muscle pain or joint pain and swelling.  Skin: Denies redness, rashes, lesions or ulcercations.  Neurological: Denies dizziness, difficulty with memory, difficulty with speech or problems with balance and coordination.  Psych: Denies anxiety, depression, SI/HI.  No other specific complaints in a complete review of systems (except as listed in HPI above).  Objective:  PE:   BP (!) 146/84   Pulse (!) 56   Temp 97.8 F (36.6 C) (Oral)   Ht 5' 0.75" (1.543 m)   Wt 138 lb (62.6 kg)   SpO2 99%   BMI 26.29 kg/m  Wt Readings from Last 3 Encounters:  05/03/17 138 lb (62.6 kg)  10/13/16 138 lb (62.6 kg)  04/12/16 142 lb (64.4 kg)    General: Appears her stated age, well developed, well nourished in NAD. Skin: Warm, dry and intact. No ulcerations noted. Cardiovascular: Normal rate and rhythm. S1,S2 noted.  No murmur, rubs or gallops noted. No JVD or BLE edema. No carotid bruits noted. Pulmonary/Chest: Normal effort and positive vesicular breath sounds. No respiratory distress. No wheezes, rales or ronchi noted.  Abdomen: Soft and nontender. Normal bowel sounds. No distention or  masses noted. Musculoskeletal:  Strength 5/5 BUE/BLE. No difficulty with gait.  Neurological: Alert and oriented. Cranial nerves II-XII grossly intact. Coordination normal.  Psychiatric: Mood and affect normal. Behavior is normal. Judgment and thought content normal.    BMET    Component Value Date/Time   NA 140 03/16/2016 0914   K 4.4 03/16/2016 0914   CL 106 03/16/2016 0914   CO2 30 03/16/2016 0914   GLUCOSE 101 (H) 03/16/2016 0914   BUN 12 03/16/2016 0914   CREATININE 1.02 03/16/2016 0914   CALCIUM 9.2 03/16/2016 0914    Lipid Panel     Component Value Date/Time   CHOL 111 03/16/2016 0914   TRIG 59.0 03/16/2016 0914   HDL 45.20 03/16/2016 0914   CHOLHDL 2 03/16/2016 0914   VLDL  11.8 03/16/2016 0914   LDLCALC 54 03/16/2016 0914    CBC    Component Value Date/Time   WBC 7.9 03/16/2016 0914   RBC 4.31 03/16/2016 0914   HGB 12.3 03/16/2016 0914   HCT 35.7 (L) 03/16/2016 0914   PLT 282.0 03/16/2016 0914   MCV 82.9 03/16/2016 0914   MCHC 34.3 03/16/2016 0914   RDW 12.6 03/16/2016 0914    Hgb A1C Lab Results  Component Value Date   HGBA1C 6.2 03/16/2016      Assessment and Plan:   Medicare Annual Wellness Visit:  Diet: She has been cutting back on meat. She does consume fruits and veggies. She occassionally eats fried foods. She drinks mostly water. Physical activity: Walking 3 miles, 4 days per week Depression/mood screen: Negative Hearing: Intact to whispered voice Visual acuity: Grossly normal, performs annual eye exam  ADLs: Capable Fall risk: None Home safety: Good Cognitive evaluation: Intact to orientation, naming, recall and repetition EOL planning: No adv directives, full code/ I agree  Preventative Medicine: Flu and pneumovax today/ Prevnar UTD. She declines zostovax or shingrix. Mammogram and bone density UTD. She declines pap smear or colonoscopy at this time. Agreeable to Cologuard, will order. Encouraged her to consume a balanced diet and  exercise regimen. Advised her to see an eye doctor and dentist annually. Will check CBC, CMET, Lipid, A1C and Vit D today.   Next appointment: 1 year, Medicare Wellness Exam   Webb Silversmith, NP

## 2017-05-07 ENCOUNTER — Other Ambulatory Visit: Payer: Self-pay | Admitting: Internal Medicine

## 2017-05-07 ENCOUNTER — Encounter: Payer: Self-pay | Admitting: Internal Medicine

## 2017-05-07 DIAGNOSIS — E559 Vitamin D deficiency, unspecified: Secondary | ICD-10-CM

## 2017-05-07 MED ORDER — VITAMIN D (ERGOCALCIFEROL) 1.25 MG (50000 UNIT) PO CAPS: 50000.0000 [IU] | ORAL_CAPSULE | ORAL | 0 refills | Status: DC

## 2017-05-07 NOTE — Assessment & Plan Note (Signed)
CBC today.  

## 2017-05-07 NOTE — Assessment & Plan Note (Signed)
Avoid foods that trigger your reflux Continue Omeprazole as needed CBC and CMET today 

## 2017-05-07 NOTE — Assessment & Plan Note (Signed)
CMET and lipid profile today Encouraged her to consume a low fat diet Continue Simvastatin, will adjust if needed

## 2017-05-07 NOTE — Assessment & Plan Note (Signed)
A1C today No microalbumin due to ACEI Encouraged her to consume a low carb diet and exercise for weight loss Foot exam today Continue yearly eye exams Continue Metformin, will adjust meds if needed based on labs.

## 2017-05-07 NOTE — Assessment & Plan Note (Signed)
Elevated today Discussed the importance of taking her medication before she comes to see me Reinforced DASH diet  CBC and CMET today

## 2017-05-15 ENCOUNTER — Other Ambulatory Visit: Payer: Self-pay | Admitting: Internal Medicine

## 2017-05-16 ENCOUNTER — Ambulatory Visit: Payer: Medicare Other | Admitting: Podiatry

## 2017-05-16 ENCOUNTER — Encounter: Payer: Self-pay | Admitting: Podiatry

## 2017-05-16 DIAGNOSIS — M205X1 Other deformities of toe(s) (acquired), right foot: Secondary | ICD-10-CM

## 2017-05-16 DIAGNOSIS — M722 Plantar fascial fibromatosis: Secondary | ICD-10-CM

## 2017-05-16 MED ORDER — TRIAMCINOLONE ACETONIDE 10 MG/ML IJ SUSP
10.0000 mg | Freq: Once | INTRAMUSCULAR | Status: AC
Start: 2017-05-16 — End: 2017-05-16
  Administered 2017-05-16: 10 mg

## 2017-05-16 NOTE — Progress Notes (Signed)
Subjective:   Patient ID: Vicki Perry, female   DOB: 67 y.o.   MRN: [id number]   HPI Patient presents with exquisite discomfort in the mid arch area bilateral with inflammation and also has significant loss of motion with pain first MPJ of the right foot   ROS      Objective:  Physical Exam  Neurovascular status intact with mid arch fasciitis bilateral with inflammation and pain around the first MPJ right with structural changes of the joint with narrowing of the joint surface     Assessment:  Mid arch plantar fasciitis bilateral with hallux limitus deformity right     Plan:  H&P conditions reviewed and mid arch injection administered 3 mg Kenalog 5 mg Xylocaine bilateral and discussed hallux limitus right and the possibility for implant procedure in the future.  Patient will be seen back for us to recheck 4 weeks or earlier if needed and may require orthotics  X-rays indicate that there is significant hallux limitus deformity right with no other significant pathology noted

## 2017-06-05 ENCOUNTER — Other Ambulatory Visit: Payer: Self-pay | Admitting: Internal Medicine

## 2017-06-13 ENCOUNTER — Ambulatory Visit: Payer: Medicare Other | Admitting: Podiatry

## 2017-06-20 ENCOUNTER — Encounter: Payer: Self-pay | Admitting: Podiatry

## 2017-06-20 ENCOUNTER — Ambulatory Visit: Payer: Medicare Other | Admitting: Podiatry

## 2017-06-20 DIAGNOSIS — M722 Plantar fascial fibromatosis: Secondary | ICD-10-CM | POA: Diagnosis not present

## 2017-06-20 DIAGNOSIS — M779 Enthesopathy, unspecified: Secondary | ICD-10-CM | POA: Diagnosis not present

## 2017-06-20 NOTE — Progress Notes (Signed)
Subjective:   Patient ID: Vicki Perry, female   DOB: 67 y.o.   MRN: [id number]   HPI Patient presents stating the foot is feeling much better and the brace seems to be helping   ROS      Objective:  Physical Exam  Neurovascular status intact with patient's right mid arch area improved quite a bit from previous with pain still present upon deep palpation but significant improvement     Assessment:  Plantar fasciitis mid arch area right improving     Plan:  H&P condition reviewed and recommended continuation of support shoes brace ice therapy be seen back if symptoms persist or get worse

## 2017-07-10 ENCOUNTER — Other Ambulatory Visit: Payer: Self-pay | Admitting: Internal Medicine

## 2017-07-26 ENCOUNTER — Ambulatory Visit: Payer: Medicare Other | Admitting: Podiatry

## 2017-07-26 ENCOUNTER — Ambulatory Visit (INDEPENDENT_AMBULATORY_CARE_PROVIDER_SITE_OTHER): Payer: Medicare Other

## 2017-07-26 ENCOUNTER — Other Ambulatory Visit: Payer: Self-pay | Admitting: Podiatry

## 2017-07-26 ENCOUNTER — Encounter: Payer: Self-pay | Admitting: Podiatry

## 2017-07-26 DIAGNOSIS — M722 Plantar fascial fibromatosis: Secondary | ICD-10-CM

## 2017-07-26 DIAGNOSIS — M779 Enthesopathy, unspecified: Secondary | ICD-10-CM

## 2017-07-26 DIAGNOSIS — M79671 Pain in right foot: Secondary | ICD-10-CM

## 2017-07-26 MED ORDER — TRIAMCINOLONE ACETONIDE 10 MG/ML IJ SUSP
10.0000 mg | Freq: Once | INTRAMUSCULAR | Status: AC
  Administered 2017-07-26: 10 mg

## 2017-07-26 NOTE — Progress Notes (Signed)
Subjective:   Patient ID: Vicki Perry, female   DOB: 67 y.o.   MRN: [id number]   HPI Patient presents stating that she has a lot of pain in the inside of the right arch and states that it also hurts in the ankle and she did injured around a week ago and is been getting worse since then.  Did turn her foot but does not remember specific injury pattern   ROS      Objective:  Physical Exam  Neurovascular status intact muscle strength is adequate with pain of the mid arch area right worse when she gets up in the morning after sitting and into the right dorsal tendon complex     Assessment:  Acute tendinitis with fasciitis-like symptoms worse when getting up in the morning after sitting with patient having very great difficulty in sleeping     Plan:  H&P condition reviewed x-ray reviewed and today I went ahead and I did inject the mid arch area right and into the dorsal tendon complex 3 mg Kenalog 5 mg Xylocaine and I went ahead and I placed in a night splint with all instructions on usage and also for the usage of aggressive ice therapy.  Patient will be seen back to recheck  X-ray indicates that the area does show some spur formation and mild osteoporosis but no signs of fracture

## 2017-07-31 ENCOUNTER — Other Ambulatory Visit: Payer: Self-pay | Admitting: Internal Medicine

## 2017-07-31 DIAGNOSIS — E559 Vitamin D deficiency, unspecified: Secondary | ICD-10-CM

## 2017-08-16 ENCOUNTER — Encounter: Payer: Self-pay | Admitting: Internal Medicine

## 2017-08-16 ENCOUNTER — Ambulatory Visit: Payer: Medicare Other | Admitting: Internal Medicine

## 2017-08-16 ENCOUNTER — Ambulatory Visit
Admission: RE | Admit: 2017-08-16 | Discharge: 2017-08-16 | Disposition: A | Discharge status: Home / Self Care | Payer: Medicare Other | Source: Ambulatory Visit | Attending: Internal Medicine | Admitting: Internal Medicine

## 2017-08-16 ENCOUNTER — Telehealth: Payer: Self-pay

## 2017-08-16 VITALS — BP 136/78 | HR 58 | Temp 97.8°F | Wt 142.0 lb

## 2017-08-16 DIAGNOSIS — S0990XA Unspecified injury of head, initial encounter: Secondary | ICD-10-CM

## 2017-08-16 DIAGNOSIS — W010XXA Fall on same level from slipping, tripping and stumbling without subsequent striking against object, initial encounter: Secondary | ICD-10-CM | POA: Insufficient documentation

## 2017-08-16 DIAGNOSIS — M50322 Other cervical disc degeneration at C5-C6 level: Secondary | ICD-10-CM | POA: Diagnosis not present

## 2017-08-16 DIAGNOSIS — S0993XA Unspecified injury of face, initial encounter: Secondary | ICD-10-CM | POA: Diagnosis not present

## 2017-08-16 DIAGNOSIS — E559 Vitamin D deficiency, unspecified: Secondary | ICD-10-CM

## 2017-08-16 LAB — VITAMIN D 25 HYDROXY (VIT D DEFICIENCY, FRACTURES): VITD: 40.93 ng/mL (ref 30.00–100.00)

## 2017-08-16 NOTE — Telephone Encounter (Signed)
Megan at [facility] CT called report CT of head which is in Epic and I will take report to Pamala Hurry NP. Pt is not waiting.

## 2017-08-16 NOTE — Progress Notes (Signed)
Subjective:    Patient ID: Vicki Perry, female    DOB: 26-Aug-1950, 67 y.o.   MRN: [id number]  HPI  Pt presents to the clinic today with c/o a fall. She reports this occurred 1 week ago. She was going up her stairs when she tripped and fell. She fell face first. She reports some bruising of her forehead. She has been having slight headaches in her forehead area. She denies dizziness or visual changes. She denies clear fluid leaking from her nose or ears. She did not try any ice, Ibuprofen or anything OTC.  She is also due for repeat Vit D. She just finished 12 weeks of Ergocalciferol.   Review of Systems      Past Medical History:  Diagnosis Date  . Anemia   . Diabetes mellitus   . High cholesterol   . Hypertension     Current Outpatient Medications  Medication Sig Dispense Refill  . Blood Glucose Monitoring Suppl (ONE TOUCH ULTRA 2) w/Device KIT Use as directed to test blood sugar once daily E11.9 1 each 0  . glucose blood (ONE TOUCH ULTRA TEST) test strip Use as directed to test blood sugar once daily E11.9 100 each 12  . Lancets (ONETOUCH ULTRASOFT) lancets Use as directed to test blood sugar once daily E11.9 100 each 12  . lisinopril (PRINIVIL,ZESTRIL) 40 MG tablet Take 1 tablet (40 mg total) by mouth daily. 90 tablet 2  . metFORMIN (GLUCOPHAGE) 500 MG tablet Take 1 tablet (500 mg total) by mouth daily with breakfast. 90 tablet 2  . omeprazole (PRILOSEC) 20 MG capsule Take 1 capsule (20 mg total) by mouth daily. (Patient taking differently: Take 20 mg by mouth as needed. ) 90 capsule 1  . simvastatin (ZOCOR) 40 MG tablet TAKE 1 TABLET (40 MG TOTAL) BY MOUTH DAILY AT 6 PM. 90 tablet 3   No current facility-administered medications for this visit.     Allergies  Allergen Reactions  . Codeine Nausea Only    Family History  Problem Relation Age of Onset  . Diabetes Mother   . Hypertension Mother   . Cancer Mother 36       Cervical  . Hypertension Father   .  Diabetes Maternal Grandmother   . Hypertension Sister   . Diabetes Sister   . Hypertension Brother   . Heart disease Neg Hx   . Stroke Neg Hx     Social History   Socioeconomic History  . Marital status: Single    Spouse name: Not on file  . Number of children: Not on file  . Years of education: Not on file  . Highest education level: Not on file  Occupational History  . Not on file  Social Needs  . Financial resource strain: Not on file  . Food insecurity:    Worry: Not on file    Inability: Not on file  . Transportation needs:    Medical: Not on file    Non-medical: Not on file  Tobacco Use  . Smoking status: Never Smoker  . Smokeless tobacco: Never Used  Substance and Sexual Activity  . Alcohol use: No  . Drug use: No  . Sexual activity: Not on file  Lifestyle  . Physical activity:    Days per week: Not on file    Minutes per session: Not on file  . Stress: Not on file  Relationships  . Social connections:    Talks on phone: Not on file  Gets together: Not on file    Attends religious service: Not on file    Active member of club or organization: Not on file    Attends meetings of clubs or organizations: Not on file    Relationship status: Not on file  . Intimate partner violence:    Fear of current or ex partner: Not on file    Emotionally abused: Not on file    Physically abused: Not on file    Forced sexual activity: Not on file  Other Topics Concern  . Not on file  Social History Narrative  . Not on file     Constitutional: Pt reports headache. Denies fever, malaise, fatigue, or abrupt weight changes.  HEENT: Denies eye pain, eye redness, ear pain, ringing in the ears, wax buildup, runny nose, nasal congestion, bloody nose, or sore throat. Musculoskeletal: Denies decrease in range of motion, difficulty with gait, muscle pain or joint pain and swelling.  Skin: Pt reports swelling of forehead. Denies redness, rashes, lesions or ulcercations.    Neurological: Denies dizziness, difficulty with memory, difficulty with speech or problems with balance and coordination.    No other specific complaints in a complete review of systems (except as listed in HPI above).  Objective:   Physical Exam  BP 136/78   Pulse (!) 58   Temp 97.8 F (36.6 C) (Oral)   Wt 142 lb (64.4 kg)   SpO2 98%   BMI 27.05 kg/m  Wt Readings from Last 3 Encounters:  08/16/17 142 lb (64.4 kg)  05/03/17 138 lb (62.6 kg)  10/13/16 138 lb (62.6 kg)    General: Appears her stated age, well developed, well nourished in NAD. Skin: Warm, dry and intact. Mild swelling over the central forehead.  HEENT: Head: normal shape and size; Eyes: sclera white, no icterus, conjunctiva pink, PERRLA and EOMs intact; Ears: Tm's gray and intact, normal light reflex; Nose: mucosa pink and moist, septum midline; Throat/Mouth: Teeth present, mucosa pink and moist, no exudate, lesions or ulcerations noted.  Musculoskeletal: Normal flexion, extension and rotation of the cervical spine.  Neurological: Alert and oriented.  Coordination normal.    BMET    Component Value Date/Time   NA 140 05/03/2017 1004   K 4.4 05/03/2017 1004   CL 106 05/03/2017 1004   CO2 29 05/03/2017 1004   GLUCOSE 103 (H) 05/03/2017 1004   BUN 15 05/03/2017 1004   CREATININE 1.00 05/03/2017 1004   CALCIUM 9.3 05/03/2017 1004    Lipid Panel     Component Value Date/Time   CHOL 127 05/03/2017 1004   TRIG 78.0 05/03/2017 1004   HDL 47.10 05/03/2017 1004   CHOLHDL 3 05/03/2017 1004   VLDL 15.6 05/03/2017 1004   LDLCALC 64 05/03/2017 1004    CBC    Component Value Date/Time   WBC 7.0 05/03/2017 1004   RBC 4.40 05/03/2017 1004   HGB 12.2 05/03/2017 1004   HCT 36.9 05/03/2017 1004   PLT 282.0 05/03/2017 1004   MCV 83.9 05/03/2017 1004   MCHC 33.1 05/03/2017 1004   RDW 12.8 05/03/2017 1004    Hgb A1C Lab Results  Component Value Date   HGBA1C 6.3 05/03/2017            Assessment &  Plan:   Vit D Deficiency:  Repeat Vit D today  Traumatic Head Injury, Facial Injury:  Exam benign She is requesting imaging today because this is not the first time she fell and hit her head CT  Head and Face ordered today Encouraged Ibuprofen as needed  Return precautions discussed Webb Silversmith, NP

## 2017-08-16 NOTE — Telephone Encounter (Signed)
See result note.  

## 2017-08-17 ENCOUNTER — Encounter: Payer: Self-pay | Admitting: Internal Medicine

## 2017-08-17 NOTE — Patient Instructions (Signed)
Head Injury, Adult  There are many types of head injuries. They can be as minor as a bump. Some head injuries can be worse. Worse injuries include:  · A strong hit to the head that hurts the brain (concussion).  · A bruise of the brain (contusion). This means there is bleeding in the brain that can cause swelling.  · A cracked skull (skull fracture).  · Bleeding in the brain that gathers, gets thick (makes a clot), and forms a bump (hematoma).    Most problems from a head injury come in the first 24 hours. However, you may still have side effects up to 7-10 days after your injury. It is important to watch your condition for any changes.  Follow these instructions at home:  Activity  · Rest as much as possible.  · Avoid activities that are hard or tiring.  · Make sure you get enough sleep.  · Limit activities that need a lot of thought or attention, such as:  ? Watching TV.  ? Playing memory games and puzzles.  ? Job-related work or homework.  ? Working on the computer, social media, and texting.  · Avoid activities that could cause another head injury until your doctor says it is okay. This includes playing sports.  · Ask your doctor when it is safe for you to go back to your normal activities, such as work or school. Ask your doctor for a step-by-step plan for slowly going back to your normal activities.  · Ask your doctor when you can drive, ride a bicycle, or use heavy machinery. Never do these activities if you are dizzy.  Lifestyle  · Do not drink alcohol until your doctor says it is okay.  · Avoid drug use.  · If it is harder than usual to remember things, write them down.  · If you are easily distracted, try to do one thing at a time.  · Talk with family members or close friends when making important decisions.  · Tell your friends, family, a trusted coworker, and work manager about your injury, symptoms, and limits (restrictions). Have them watch for any problems that are new or getting worse.  General  instructions  · Take over-the-counter and prescription medicines only as told by your doctor.  · Have someone stay with you for 24 hours after your head injury. This person should watch you for any changes in your symptoms and be ready to get help.  · Keep all follow-up visits as told by your doctor. This is important.  How is this prevented?  · Work on your balance and strength. This can help you avoid falls.  · Wear a seatbelt when you are in a moving vehicle.  · Wear a helmet when:  ? Riding a bicycle.  ? Skiing.  ? Doing any other sport or activity that has a risk of injury.  · Drink alcohol only in moderation.  · Make your home safer by:  ? Getting rid of clutter from the floors and stairs, like things that can make you trip.  ? Using grab bars in bathrooms and handrails by stairs.  ? Placing non-slip mats on floors and in bathtubs.  ? Putting more light in dim areas.  Get help right away if:  · You have:  ? A very bad (severe) headache that is not helped by medicine.  ? Trouble walking or weakness in your arms and legs.  ? Clear or bloody fluid coming from your nose   or ears.  ? Changes in your seeing (vision).  ? Jerky movements that you cannot control (seizure).  · You throw up (vomit).  · Your symptoms get worse.  · You lose balance.  · Your speech is slurred.  · You pass out.  · You are sleepier and have trouble staying awake.  · The black centers of your eyes (pupils) change in size.  These symptoms may be an emergency. Do not wait to see if the symptoms will go away. Get medical help right away. Call your local emergency services (911 in the [place] Do not drive yourself to the hospital.  This information is not intended to replace advice given to you by your health care provider. Make sure you discuss any questions you have with your health care provider.  Document Released: 02/17/2008 Document Revised: 06/30/2016 Document Reviewed: 09/14/2015  Elsevier Interactive Patient Education © 2018 [employer]vier  Inc[employer]

## 2017-08-21 ENCOUNTER — Ambulatory Visit: Payer: Medicare Other

## 2017-08-21 ENCOUNTER — Other Ambulatory Visit: Payer: Medicare Other

## 2017-11-09 ENCOUNTER — Telehealth: Payer: Self-pay

## 2017-11-09 MED ORDER — FREESTYLE LIBRE 14 DAY READER DEVI: 1.0000 | Freq: Once | 0 refills | Status: AC

## 2017-11-09 MED ORDER — FREESTYLE LIBRE 14 DAY SENSOR MISC: 1.0000 | 1 refills | Status: DC

## 2017-11-09 NOTE — Telephone Encounter (Signed)
[facility].... Rx sent through e-scribe

## 2017-11-09 NOTE — Telephone Encounter (Signed)
Copied from CRM [phone]. Topic: General - Other >> Nov 05, 2017  4:02 PM Marylen PontoMcneil, Ja-Kwan wrote: Reason for CRM: [facility]. Pt did not want to provide any further information. >> Nov 08, 2017 10:23 AM Oneal GroutSebastian, Jennifer S wrote: [facility] >> Nov 08, 2017  3:22 PM Trula SladeWalter, Linda F wrote: [facility]

## 2017-11-23 ENCOUNTER — Ambulatory Visit: Payer: Medicare Other | Admitting: Podiatry

## 2017-11-23 ENCOUNTER — Encounter: Payer: Self-pay | Admitting: Podiatry

## 2017-11-23 DIAGNOSIS — M722 Plantar fascial fibromatosis: Secondary | ICD-10-CM

## 2017-11-23 MED ORDER — TRIAMCINOLONE ACETONIDE 10 MG/ML IJ SUSP
10.0000 mg | Freq: Once | INTRAMUSCULAR | Status: AC
  Administered 2017-11-23: 10 mg

## 2017-11-25 NOTE — Progress Notes (Signed)
Subjective:   Patient ID: Vicki Perry, female   DOB: 67 y.o.   MRN: [id number]   HPI Patient presents stating that both of her heels have flared up and making it hard to walk.  Patient states she was doing well and this is started recently   ROS      Objective:  Physical Exam  Neurovascular status intact with patient noted to have inflammation and pain in the plantar heels bilateral with fluid buildup around the medial band     Assessment:  Acute plantar fasciitis bilateral with inflammation fluid around the medial band     Plan:  H&P conditions reviewed today I did sterile prep and injected the plantar fascial bilateral 3 mg Kenalog 5 mg Xylocaine and instructed on supportive shoes.  Reappoint on an as-needed basis

## 2018-01-14 ENCOUNTER — Telehealth: Payer: Self-pay

## 2018-01-14 NOTE — Telephone Encounter (Signed)
Looking at quality metrics gaps. Patient has physical on 05/03/2017 and cologuard was mentioned to be ordered but was not from what I could see? Please review. Patient has colorectal cancer screening to be done before the end of the year. Thank you

## 2018-01-15 ENCOUNTER — Encounter (HOSPITAL_COMMUNITY): Payer: Self-pay | Admitting: Emergency Medicine

## 2018-01-15 ENCOUNTER — Ambulatory Visit (HOSPITAL_COMMUNITY)
Admission: EM | Admit: 2018-01-15 | Discharge: 2018-01-15 | Disposition: A | Discharge status: Home / Self Care | Payer: Medicare Other | Attending: Family Medicine | Admitting: Family Medicine

## 2018-01-15 DIAGNOSIS — D649 Anemia, unspecified: Secondary | ICD-10-CM | POA: Insufficient documentation

## 2018-01-15 DIAGNOSIS — E7800 Pure hypercholesterolemia, unspecified: Secondary | ICD-10-CM | POA: Diagnosis not present

## 2018-01-15 DIAGNOSIS — Z79899 Other long term (current) drug therapy: Secondary | ICD-10-CM | POA: Diagnosis not present

## 2018-01-15 DIAGNOSIS — Z885 Allergy status to narcotic agent status: Secondary | ICD-10-CM | POA: Insufficient documentation

## 2018-01-15 DIAGNOSIS — R300 Dysuria: Secondary | ICD-10-CM | POA: Diagnosis not present

## 2018-01-15 DIAGNOSIS — I10 Essential (primary) hypertension: Secondary | ICD-10-CM | POA: Insufficient documentation

## 2018-01-15 DIAGNOSIS — Z7984 Long term (current) use of oral hypoglycemic drugs: Secondary | ICD-10-CM | POA: Diagnosis not present

## 2018-01-15 DIAGNOSIS — E119 Type 2 diabetes mellitus without complications: Secondary | ICD-10-CM | POA: Diagnosis not present

## 2018-01-15 DIAGNOSIS — N390 Urinary tract infection, site not specified: Secondary | ICD-10-CM | POA: Diagnosis present

## 2018-01-15 DIAGNOSIS — K219 Gastro-esophageal reflux disease without esophagitis: Secondary | ICD-10-CM | POA: Insufficient documentation

## 2018-01-15 DIAGNOSIS — M545 Low back pain: Secondary | ICD-10-CM | POA: Diagnosis present

## 2018-01-15 LAB — POCT URINALYSIS DIP (DEVICE)
BILIRUBIN URINE: NEGATIVE
Glucose, UA: NEGATIVE mg/dL
HGB URINE DIPSTICK: NEGATIVE
Ketones, ur: NEGATIVE mg/dL
LEUKOCYTES UA: NEGATIVE
Nitrite: NEGATIVE
PH: 6.5 (ref 5.0–8.0)
Protein, ur: NEGATIVE mg/dL
Specific Gravity, Urine: 1.01 (ref 1.005–1.030)
UROBILINOGEN UA: 0.2 mg/dL (ref 0.0–1.0)

## 2018-01-15 MED ORDER — PHENAZOPYRIDINE HCL 200 MG PO TABS: 200.0000 mg | ORAL_TABLET | Freq: Three times a day (TID) | ORAL | 0 refills | Status: DC

## 2018-01-15 NOTE — Discharge Instructions (Addendum)
Your urine did not show any sign of infection We will send the swab off to check for yeast and bacterial vaginosis as causes of discomfort with urination Please continue to drink plenty of fluids to keep the urine clear May consider using coconut oil vaginally at nighttime once a day to help with any dryness May use Pyridium as needed for burning  Please continue to monitor symptoms, follow-up if not improving, developing worsening symptoms, fever, nausea, vomiting, worsening pain

## 2018-01-15 NOTE — ED Triage Notes (Signed)
Pt sts UTI sx x 3 days 

## 2018-01-16 ENCOUNTER — Telehealth: Payer: Self-pay

## 2018-01-16 LAB — CERVICOVAGINAL ANCILLARY ONLY
Bacterial vaginitis: NEGATIVE
Candida vaginitis: NEGATIVE

## 2018-01-16 NOTE — Telephone Encounter (Signed)
Need diabetic eye exam report to be updated. It looks that it was updated last per patient but do not see an actual report per [facility] report, gap needs to be closed. Please and thank you.

## 2018-01-16 NOTE — Telephone Encounter (Signed)
Cologuard form has been faxed w/ demographics and insurance card attached 

## 2018-01-16 NOTE — ED Provider Notes (Signed)
[place]    CSN: [id number] Arrival date & time: 01/15/18  1029     History   Chief Complaint Chief Complaint  Patient presents with  . Urinary Tract Infection    HPI Vicki Perry is a 67 y.o. female history of hypertension, hyperlipidemia, DM type II presenting today for evaluation of possible UTI.  Patient states that over the past 3 days she has had increased frequency in urination, dysuria and some slight lower back pain.  She denies incomplete voiding, and is still urinating a decent amount with each trip to the bathroom.  She has been drinking a lot of water, and states her urine has been clear.  She is also tried some cranberry juice.  Patient states that she typically tries medical douching for her symptoms which usually helps.  She denies any vaginal discharge.  She does note she has had some vaginal itching.  Denies fever, nausea, vomiting, abdominal pain.  HPI  Past Medical History:  Diagnosis Date  . Anemia   . Diabetes mellitus   . High cholesterol   . Hypertension     Patient Active Problem List   Diagnosis Date Noted  . GERD (gastroesophageal reflux disease) 03/02/2015  . DM type 2 (diabetes mellitus, type 2) ([place]) 02/10/2013  . Anemia 10/18/2010  . Hypertension 10/18/2010  . Hypercholesteremia 10/18/2010    Past Surgical History:  Procedure Laterality Date  . CESAREAN SECTION    . FOOT SURGERY Right    Bone Spur removal  . TONSILLECTOMY    . WISDOM TOOTH EXTRACTION      OB History   None      Home Medications    Prior to Admission medications   Medication Sig Start Date End Date Taking? Authorizing Provider  Blood Glucose Monitoring Suppl (ONE TOUCH ULTRA 2) w/Device KIT Use as directed to test blood sugar once daily E11.9 08/24/16   Jearld Fenton, NP  Continuous Blood Gluc Sensor (FREESTYLE LIBRE 14 DAY SENSOR) MISC 1 each by Does not apply route every 14 (fourteen) days. 11/09/17   Jearld Fenton, NP  glucose blood (ONE  TOUCH ULTRA TEST) test strip Use as directed to test blood sugar once daily E11.9 08/24/16   Jearld Fenton, NP  Lancets [facility] ULTRASOFT) lancets Use as directed to test blood sugar once daily E11.9 08/24/16   Jearld Fenton, NP  lisinopril (PRINIVIL,ZESTRIL) 40 MG tablet Take 1 tablet (40 mg total) by mouth daily. 06/06/17   Jearld Fenton, NP  metFORMIN (GLUCOPHAGE) 500 MG tablet Take 1 tablet (500 mg total) by mouth daily with breakfast. 07/10/17   Jearld Fenton, NP  omeprazole (PRILOSEC) 20 MG capsule Take 1 capsule (20 mg total) by mouth daily. Patient taking differently: Take 20 mg by mouth as needed.  03/02/15   Jearld Fenton, NP  phenazopyridine (PYRIDIUM) 200 MG tablet Take 1 tablet (200 mg total) by mouth 3 (three) times daily. 01/15/18   Balthazar Dooly C, PA-C  simvastatin (ZOCOR) 40 MG tablet TAKE 1 TABLET (40 MG TOTAL) BY MOUTH DAILY AT 6 PM. 05/15/17   Jearld Fenton, NP    Family History Family History  Problem Relation Age of Onset  . Diabetes Mother   . Hypertension Mother   . Cancer Mother 16       Cervical  . Hypertension Father   . Diabetes Maternal Grandmother   . Hypertension Sister   . Diabetes Sister   . Hypertension Brother   .  Heart disease Neg Hx   . Stroke Neg Hx     Social History Social History   Tobacco Use  . Smoking status: Never Smoker  . Smokeless tobacco: Never Used  Substance Use Topics  . Alcohol use: No  . Drug use: No     Allergies   Codeine   Review of Systems Review of Systems  Constitutional: Negative for fever.  Respiratory: Negative for shortness of breath.   Cardiovascular: Negative for chest pain.  Gastrointestinal: Negative for abdominal pain, diarrhea, nausea and vomiting.  Genitourinary: Positive for dysuria and frequency. Negative for flank pain, genital sores, hematuria, menstrual problem, vaginal bleeding, vaginal discharge and vaginal pain.  Musculoskeletal: Positive for back pain.  Skin: Negative for rash.    Neurological: Negative for dizziness, light-headedness and headaches.     Physical Exam Triage Vital Signs ED Triage Vitals  Enc Vitals Group     BP 01/15/18 1123 (!) 183/61     Pulse Rate 01/15/18 1123 62     Resp 01/15/18 1123 16     Temp 01/15/18 1123 98.6 F (37 C)     Temp Source 01/15/18 1123 Oral     SpO2 01/15/18 1123 100 %     Weight --      Height --      Head Circumference --      Peak Flow --      Pain Score 01/15/18 1127 4     Pain Loc --      Pain Edu? --      Excl. in [place]? --    No data found.  Updated Vital Signs BP (!) 183/61 (BP Location: Left Arm)   Pulse 62   Temp 98.6 F (37 C) (Oral)   Resp 16   SpO2 100%   Visual Acuity Right Eye Distance:   Left Eye Distance:   Bilateral Distance:    Right Eye Near:   Left Eye Near:    Bilateral Near:     Physical Exam  Constitutional: She is oriented to person, place, and time. She appears well-developed and well-nourished.  No acute distress  HENT:  Head: Normocephalic and atraumatic.  Nose: Nose normal.  Eyes: Conjunctivae are normal.  Neck: Neck supple.  Cardiovascular: Normal rate.  Pulmonary/Chest: Effort normal. No respiratory distress.  Abdominal: Soft. She exhibits no distension. There is no tenderness.  Nontender to light and deep palpation throughout abdomen, no CVA tenderness  Genitourinary:  Genitourinary Comments: Deferred  Musculoskeletal: Normal range of motion.  Neurological: She is alert and oriented to person, place, and time.  Skin: Skin is warm and dry.  Psychiatric: She has a normal mood and affect.  Nursing note and vitals reviewed.    UC Treatments / Results  Labs (all labs ordered are listed, but only abnormal results are displayed) Labs Reviewed  POCT URINALYSIS DIP (DEVICE)  CERVICOVAGINAL ANCILLARY ONLY    EKG None  Radiology No results found.  Procedures Procedures (including critical care time)  Medications Ordered in UC Medications - No data to  display  Initial Impression / Assessment and Plan / UC Course  I have reviewed the triage vital signs and the nursing notes.  Pertinent labs & imaging results that were available during my care of the patient were reviewed by me and considered in my medical decision making (see chart for details).     Patient with dysuria, UA completely normal, does not appear dehydrated.  Discussed with patient other causes of dysuria including  yeast/BV or vaginal dryness.  At this time will treat patient for Pyridium.  Swab obtained to check for yeast and BV, also recommended continuing drinking plenty of fluids and may try coconut oil vaginally at bedtime.Discussed strict return precautions. Patient verbalized understanding and is agreeable with plan.  Final Clinical Impressions(s) / UC Diagnoses   Final diagnoses:  Dysuria     Discharge Instructions     Your urine did not show any sign of infection We will send the swab off to check for yeast and bacterial vaginosis as causes of discomfort with urination Please continue to drink plenty of fluids to keep the urine clear May consider using coconut oil vaginally at nighttime once a day to help with any dryness May use Pyridium as needed for burning  Please continue to monitor symptoms, follow-up if not improving, developing worsening symptoms, fever, nausea, vomiting, worsening pain   ED Prescriptions    Medication Sig Dispense Auth. Provider   phenazopyridine (PYRIDIUM) 200 MG tablet Take 1 tablet (200 mg total) by mouth 3 (three) times daily. 6 tablet Keatyn Luck, [place] C, PA-C     Controlled Substance Prescriptions [place] Controlled Substance Registry consulted? Not Applicable   Janith Lima, [place] 01/16/18 1125

## 2018-01-17 ENCOUNTER — Telehealth: Payer: Self-pay | Admitting: Internal Medicine

## 2018-01-17 ENCOUNTER — Encounter: Payer: Self-pay | Admitting: Internal Medicine

## 2018-01-17 ENCOUNTER — Ambulatory Visit (INDEPENDENT_AMBULATORY_CARE_PROVIDER_SITE_OTHER): Payer: Medicare Other | Admitting: Internal Medicine

## 2018-01-17 VITALS — BP 155/92 | HR 60 | Temp 98.0°F | Wt 140.0 lb

## 2018-01-17 DIAGNOSIS — R350 Frequency of micturition: Secondary | ICD-10-CM

## 2018-01-17 DIAGNOSIS — M545 Low back pain, unspecified: Secondary | ICD-10-CM

## 2018-01-17 DIAGNOSIS — G8929 Other chronic pain: Secondary | ICD-10-CM

## 2018-01-17 DIAGNOSIS — R1030 Lower abdominal pain, unspecified: Secondary | ICD-10-CM

## 2018-01-17 DIAGNOSIS — I10 Essential (primary) hypertension: Secondary | ICD-10-CM

## 2018-01-17 LAB — POC URINALSYSI DIPSTICK (AUTOMATED)
BILIRUBIN UA: NEGATIVE
Glucose, UA: NEGATIVE
KETONES UA: NEGATIVE
LEUKOCYTES UA: NEGATIVE
Nitrite, UA: NEGATIVE
PH UA: 6 (ref 5.0–8.0)
PROTEIN UA: POSITIVE — AB
RBC UA: NEGATIVE
Spec Grav, UA: >=1.03 — AB (ref 1.010–1.025)
Urobilinogen, UA: 0.2 E.U./dL

## 2018-01-17 MED ORDER — AMLODIPINE BESYLATE 10 MG PO TABS: 10.0000 mg | ORAL_TABLET | Freq: Every day | ORAL | 0 refills | Status: DC

## 2018-01-17 NOTE — Telephone Encounter (Signed)
I spoke with Nedra Hai at [facility] and the possible interaction between simvastatin and amlodipine could be an increase in the concentration of the  simvastatin. Please advise.

## 2018-01-17 NOTE — Patient Instructions (Signed)
DASH Eating Plan DASH stands for "Dietary Approaches to Stop Hypertension." The DASH eating plan is a healthy eating plan that has been shown to reduce high blood pressure (hypertension). It may also reduce your risk for type 2 diabetes, heart disease, and stroke. The DASH eating plan may also help with weight loss. What are tips for following this plan? General guidelines  Avoid eating more than 2,300 mg (milligrams) of salt (sodium) a day. If you have hypertension, you may need to reduce your sodium intake to 1,500 mg a day.  Limit alcohol intake to no more than 1 drink a day for nonpregnant women and 2 drinks a day for men. One drink equals 12 oz of beer, 5 oz of wine, or 1 oz of hard liquor.  Work with your health care provider to maintain a healthy body weight or to lose weight. Ask what an ideal weight is for you.  Get at least 30 minutes of exercise that causes your heart to beat faster (aerobic exercise) most days of the week. Activities may include walking, swimming, or biking.  Work with your health care provider or diet and nutrition specialist (dietitian) to adjust your eating plan to your individual calorie needs. Reading food labels  Check food labels for the amount of sodium per serving. Choose foods with less than 5 percent of the Daily Value of sodium. Generally, foods with less than 300 mg of sodium per serving fit into this eating plan.  To find whole grains, look for the word "whole" as the first word in the ingredient list. Shopping  Buy products labeled as "low-sodium" or "no salt added."  Buy fresh foods. Avoid canned foods and premade or frozen meals. Cooking  Avoid adding salt when cooking. Use salt-free seasonings or herbs instead of table salt or sea salt. Check with your health care provider or pharmacist before using salt substitutes.  Do not fry foods. Cook foods using healthy methods such as baking, boiling, grilling, and broiling instead.  Cook with  heart-healthy oils, such as olive, canola, soybean, or sunflower oil. Meal planning   Eat a balanced diet that includes: ? 5 or more servings of fruits and vegetables each day. At each meal, try to fill half of your plate with fruits and vegetables. ? Up to 6-8 servings of whole grains each day. ? Less than 6 oz of lean meat, poultry, or fish each day. A 3-oz serving of meat is about the same size as a deck of cards. One egg equals 1 oz. ? 2 servings of low-fat dairy each day. ? A serving of nuts, seeds, or beans 5 times each week. ? Heart-healthy fats. Healthy fats called Omega-3 fatty acids are found in foods such as flaxseeds and coldwater fish, like sardines, salmon, and mackerel.  Limit how much you eat of the following: ? Canned or prepackaged foods. ? Food that is high in trans fat, such as fried foods. ? Food that is high in saturated fat, such as fatty meat. ? Sweets, desserts, sugary drinks, and other foods with added sugar. ? Full-fat dairy products.  Do not salt foods before eating.  Try to eat at least 2 vegetarian meals each week.  Eat more home-cooked food and less restaurant, buffet, and fast food.  When eating at a restaurant, ask that your food be prepared with less salt or no salt, if possible. What foods are recommended? The items listed may not be a complete list. Talk with your dietitian about what   dietary choices are best for you. Grains Whole-grain or whole-wheat bread. Whole-grain or whole-wheat pasta. Brown rice. Oatmeal. Quinoa. Bulgur. Whole-grain and low-sodium cereals. Pita bread. Low-fat, low-sodium crackers. Whole-wheat flour tortillas. Vegetables Fresh or frozen vegetables (raw, steamed, roasted, or grilled). Low-sodium or reduced-sodium tomato and vegetable juice. Low-sodium or reduced-sodium tomato sauce and tomato paste. Low-sodium or reduced-sodium canned vegetables. Fruits All fresh, dried, or frozen fruit. Canned fruit in natural juice (without  added sugar). Meat and other protein foods Skinless chicken or turkey. Ground chicken or turkey. Pork with fat trimmed off. Fish and seafood. Egg whites. Dried beans, peas, or lentils. Unsalted nuts, nut butters, and seeds. Unsalted canned beans. Lean cuts of beef with fat trimmed off. Low-sodium, lean deli meat. Dairy Low-fat (1%) or fat-free (skim) milk. Fat-free, low-fat, or reduced-fat cheeses. Nonfat, low-sodium ricotta or cottage cheese. Low-fat or nonfat yogurt. Low-fat, low-sodium cheese. Fats and oils Soft margarine without trans fats. Vegetable oil. Low-fat, reduced-fat, or light mayonnaise and salad dressings (reduced-sodium). Canola, safflower, olive, soybean, and sunflower oils. Avocado. Seasoning and other foods Herbs. Spices. Seasoning mixes without salt. Unsalted popcorn and pretzels. Fat-free sweets. What foods are not recommended? The items listed may not be a complete list. Talk with your dietitian about what dietary choices are best for you. Grains Baked goods made with fat, such as croissants, muffins, or some breads. Dry pasta or rice meal packs. Vegetables Creamed or fried vegetables. Vegetables in a cheese sauce. Regular canned vegetables (not low-sodium or reduced-sodium). Regular canned tomato sauce and paste (not low-sodium or reduced-sodium). Regular tomato and vegetable juice (not low-sodium or reduced-sodium). Pickles. Olives. Fruits Canned fruit in a light or heavy syrup. Fried fruit. Fruit in cream or butter sauce. Meat and other protein foods Fatty cuts of meat. Ribs. Fried meat. Bacon. Sausage. Bologna and other processed lunch meats. Salami. Fatback. Hotdogs. Bratwurst. Salted nuts and seeds. Canned beans with added salt. Canned or smoked fish. Whole eggs or egg yolks. Chicken or turkey with skin. Dairy Whole or 2% milk, cream, and half-and-half. Whole or full-fat cream cheese. Whole-fat or sweetened yogurt. Full-fat cheese. Nondairy creamers. Whipped toppings.  Processed cheese and cheese spreads. Fats and oils Butter. Stick margarine. Lard. Shortening. Ghee. Bacon fat. Tropical oils, such as coconut, palm kernel, or palm oil. Seasoning and other foods Salted popcorn and pretzels. Onion salt, garlic salt, seasoned salt, table salt, and sea salt. Worcestershire sauce. Tartar sauce. Barbecue sauce. Teriyaki sauce. Soy sauce, including reduced-sodium. Steak sauce. Canned and packaged gravies. Fish sauce. Oyster sauce. Cocktail sauce. Horseradish that you find on the shelf. Ketchup. Mustard. Meat flavorings and tenderizers. Bouillon cubes. Hot sauce and Tabasco sauce. Premade or packaged marinades. Premade or packaged taco seasonings. Relishes. Regular salad dressings. Where to find more information:  National Heart, Lung, and Blood Institute: www.nhlbi.nih.gov  American Heart Association: www.heart.org Summary  The DASH eating plan is a healthy eating plan that has been shown to reduce high blood pressure (hypertension). It may also reduce your risk for type 2 diabetes, heart disease, and stroke.  With the DASH eating plan, you should limit salt (sodium) intake to 2,300 mg a day. If you have hypertension, you may need to reduce your sodium intake to 1,500 mg a day.  When on the DASH eating plan, aim to eat more fresh fruits and vegetables, whole grains, lean proteins, low-fat dairy, and heart-healthy fats.  Work with your health care provider or diet and nutrition specialist (dietitian) to adjust your eating plan to your individual   calorie needs. This information is not intended to replace advice given to you by your health care provider. Make sure you discuss any questions you have with your health care provider. Document Released: 02/23/2011 Document Revised: 02/28/2016 Document Reviewed: 02/28/2016 Elsevier Interactive Patient Education  2018 Elsevier Inc.  

## 2018-01-17 NOTE — Telephone Encounter (Signed)
Copied from CRM [id number]. Topic: General - Other >> Jan 17, 2018 12:00 PM Tamela Oddi wrote: Reason for CRM: [facility] with [facility] called to inform doctor of a possible interaction between the patient's two drugs simvastatin (ZOCOR) 40 MG tablet and amLODipine (NORVASC) 10 MG tablet.  Please advise and let pharmacy know if there is an alternative medication.  CB# [phone]

## 2018-01-17 NOTE — Addendum Note (Signed)
Addended by: Roena Malady on: 01/17/2018 03:20 PM   Modules accepted: Orders

## 2018-01-17 NOTE — Progress Notes (Signed)
Subjective:    Patient ID: Vicki Perry, female    DOB: 09-09-1950, 67 y.o.   MRN: [id number]  HPI  Pt presents to the clinic today for UC Follow Up. She went to the UC for urinary frequency, dysuria, vaginal pain and low back pain. This started 5 days ago.  Urinalysis and wet prep were normal. She was treated with Pyridium and advised to follow up with her PCP. She reports persistent lower abdominal pain and low back pain, but reports it is better. She reports she has a bulging disc in her back, has been seeing a Restaurant manager, fast food. She has been taking Tylenol and using heat with some relief.   Of note, she reports elevated blood pressure. At [facility], it was 183/61. She is taking Lisinopril as prescribed. Her BP today is 155/92. ECG from 02/2014 reviewed.  Review of Systems  Past Medical History:  Diagnosis Date  . Anemia   . Diabetes mellitus   . High cholesterol   . Hypertension     Current Outpatient Medications  Medication Sig Dispense Refill  . Blood Glucose Monitoring Suppl (ONE TOUCH ULTRA 2) w/Device KIT Use as directed to test blood sugar once daily E11.9 1 each 0  . Continuous Blood Gluc Sensor (FREESTYLE LIBRE 14 DAY SENSOR) MISC 1 each by Does not apply route every 14 (fourteen) days. 3 each 1  . glucose blood (ONE TOUCH ULTRA TEST) test strip Use as directed to test blood sugar once daily E11.9 100 each 12  . Lancets (ONETOUCH ULTRASOFT) lancets Use as directed to test blood sugar once daily E11.9 100 each 12  . lisinopril (PRINIVIL,ZESTRIL) 40 MG tablet Take 1 tablet (40 mg total) by mouth daily. 90 tablet 2  . metFORMIN (GLUCOPHAGE) 500 MG tablet Take 1 tablet (500 mg total) by mouth daily with breakfast. 90 tablet 2  . omeprazole (PRILOSEC) 20 MG capsule Take 1 capsule (20 mg total) by mouth daily. (Patient taking differently: Take 20 mg by mouth as needed. ) 90 capsule 1  . phenazopyridine (PYRIDIUM) 200 MG tablet Take 1 tablet (200 mg total) by mouth 3 (three) times daily. 6  tablet 0  . simvastatin (ZOCOR) 40 MG tablet TAKE 1 TABLET (40 MG TOTAL) BY MOUTH DAILY AT 6 PM. 90 tablet 3   No current facility-administered medications for this visit.     Allergies  Allergen Reactions  . Codeine Nausea Only    Family History  Problem Relation Age of Onset  . Diabetes Mother   . Hypertension Mother   . Cancer Mother 55       Cervical  . Hypertension Father   . Diabetes Maternal Grandmother   . Hypertension Sister   . Diabetes Sister   . Hypertension Brother   . Heart disease Neg Hx   . Stroke Neg Hx     Social History   Socioeconomic History  . Marital status: Single    Spouse name: Not on file  . Number of children: Not on file  . Years of education: Not on file  . Highest education level: Not on file  Occupational History  . Not on file  Social Needs  . Financial resource strain: Not on file  . Food insecurity:    Worry: Not on file    Inability: Not on file  . Transportation needs:    Medical: Not on file    Non-medical: Not on file  Tobacco Use  . Smoking status: Never Smoker  . Smokeless  tobacco: Never Used  Substance and Sexual Activity  . Alcohol use: No  . Drug use: No  . Sexual activity: Not on file  Lifestyle  . Physical activity:    Days per week: Not on file    Minutes per session: Not on file  . Stress: Not on file  Relationships  . Social connections:    Talks on phone: Not on file    Gets together: Not on file    Attends religious service: Not on file    Active member of club or organization: Not on file    Attends meetings of clubs or organizations: Not on file    Relationship status: Not on file  . Intimate partner violence:    Fear of current or ex partner: Not on file    Emotionally abused: Not on file    Physically abused: Not on file    Forced sexual activity: Not on file  Other Topics Concern  . Not on file  Social History Narrative  . Not on file     Constitutional: Pt reports intermittent  headaches. Denies fever, malaise, fatigue, or abrupt weight changes.  Respiratory: Denies difficulty breathing, shortness of breath, cough or sputum production.   Cardiovascular: Denies chest pain, chest tightness, palpitations or swelling in the hands or feet.  Gastrointestinal: Pt reports lower abdominal pain.  Denies bloating, constipation, diarrhea or blood in the stool.  GU: Denies urgency, frequency, pain with urination, burning sensation, blood in urine, odor or discharge. Musculoskeletal: Pt reports low back pain. Denies decrease in range of motion, difficulty with gait,  or joint pain and swelling.  Skin: Denies redness, rashes, lesions or ulcercations.    No other specific complaints in a complete review of systems (except as listed in HPI above).     Objective:   Physical Exam  BP (!) 155/92   Pulse 60   Temp 98 F (36.7 C) (Oral)   Wt 140 lb (63.5 kg)   SpO2 98%   BMI 26.67 kg/m  Wt Readings from Last 3 Encounters:  01/17/18 140 lb (63.5 kg)  08/16/17 142 lb (64.4 kg)  05/03/17 138 lb (62.6 kg)    General: Appears her stated age, in NAD. Skin: Warm, dry and intact. No rashes  noted. Cardiovascular: Normal rate and rhythm. S1,S2 noted.  Occasional ectopic beat noted. No murmur, rubs or gallops noted. No JVD or BLE edema.  Pulmonary/Chest: Normal effort and positive vesicular breath sounds. No respiratory distress. No wheezes, rales or ronchi noted.  Abdomen: Soft and mildly tender over the bladder. Normal bowel sounds. No distention or masses noted. No CVA tenderness noted. Musculoskeletal: Pain with palpation over the lumbar spine. No difficulty with gait.  Neurological: Alert and oriented.   BMET    Component Value Date/Time   NA 140 05/03/2017 1004   K 4.4 05/03/2017 1004   CL 106 05/03/2017 1004   CO2 29 05/03/2017 1004   GLUCOSE 103 (H) 05/03/2017 1004   BUN 15 05/03/2017 1004   CREATININE 1.00 05/03/2017 1004   CALCIUM 9.3 05/03/2017 1004    Lipid  Panel     Component Value Date/Time   CHOL 127 05/03/2017 1004   TRIG 78.0 05/03/2017 1004   HDL 47.10 05/03/2017 1004   CHOLHDL 3 05/03/2017 1004   VLDL 15.6 05/03/2017 1004   LDLCALC 64 05/03/2017 1004    CBC    Component Value Date/Time   WBC 7.0 05/03/2017 1004   RBC 4.40 05/03/2017 1004  HGB 12.2 05/03/2017 1004   HCT 36.9 05/03/2017 1004   PLT 282.0 05/03/2017 1004   MCV 83.9 05/03/2017 1004   MCHC 33.1 05/03/2017 1004   RDW 12.8 05/03/2017 1004    Hgb A1C Lab Results  Component Value Date   HGBA1C 6.3 05/03/2017            Assessment & Plan:   UC Follow Up for Urinary Freguency, Lower Abdominal Pain, Low Back Pain:  Will repeat urinalysis Will send urine culture Could be MSK releated Encouraged heat, stretching She will continue to follow with chiropractor  HTN:  Elevated Discussed stroke risk Continue Lisinopril 40 mg daily Add Amlodipine 10 mg daily Reinforced DASH diet and exercise for weight loss  RTC in 2 weeks for HTN follow up Webb Silversmith, NP

## 2018-01-18 LAB — URINE CULTURE
MICRO NUMBER:: 91312129
Result:: NO GROWTH
SPECIMEN QUALITY:: ADEQUATE

## 2018-01-18 NOTE — Telephone Encounter (Signed)
Ok, lets do this. Have her not fill the Amlodpine, stop the Lisinopril and try Losartan 100 mg. If she is agreeable, send in Losartan 100 mg #30, 0 refills. Should still follow up with me in 2 weeks.

## 2018-01-21 ENCOUNTER — Telehealth: Payer: Self-pay

## 2018-01-21 MED ORDER — LOSARTAN POTASSIUM 100 MG PO TABS: 100.0000 mg | ORAL_TABLET | Freq: Every day | ORAL | 0 refills | Status: DC

## 2018-01-21 NOTE — Telephone Encounter (Signed)
Pt left v/m requesting cb about new rx not being filled.

## 2018-01-21 NOTE — Addendum Note (Signed)
Addended by: Roena Malady on: 01/21/2018 04:58 PM   Modules accepted: Orders

## 2018-01-21 NOTE — Telephone Encounter (Signed)
error 

## 2018-01-21 NOTE — Telephone Encounter (Signed)
Pt is aware as instructed and expressed understanding ? ?Rx sent through e-scribe ? ?

## 2018-01-31 ENCOUNTER — Ambulatory Visit: Payer: Medicare Other | Admitting: Internal Medicine

## 2018-02-06 ENCOUNTER — Emergency Department (HOSPITAL_COMMUNITY)
Admission: EM | Admit: 2018-02-06 | Discharge: 2018-02-06 | Disposition: A | Discharge status: Home / Self Care | Payer: Medicare Other | Attending: Emergency Medicine | Admitting: Emergency Medicine

## 2018-02-06 ENCOUNTER — Other Ambulatory Visit: Payer: Self-pay

## 2018-02-06 DIAGNOSIS — Z7984 Long term (current) use of oral hypoglycemic drugs: Secondary | ICD-10-CM | POA: Diagnosis not present

## 2018-02-06 DIAGNOSIS — Z7982 Long term (current) use of aspirin: Secondary | ICD-10-CM | POA: Insufficient documentation

## 2018-02-06 DIAGNOSIS — I10 Essential (primary) hypertension: Secondary | ICD-10-CM | POA: Diagnosis present

## 2018-02-06 DIAGNOSIS — Z79899 Other long term (current) drug therapy: Secondary | ICD-10-CM | POA: Insufficient documentation

## 2018-02-06 DIAGNOSIS — E119 Type 2 diabetes mellitus without complications: Secondary | ICD-10-CM | POA: Insufficient documentation

## 2018-02-06 NOTE — ED Provider Notes (Signed)
[facility] EMERGENCY DEPARTMENT Provider Note   CSN: [id number] Arrival date & time: 02/06/18  2211     History   Chief Complaint Chief Complaint  Patient presents with  . Hypertension    HPI Vicki Perry is a 67 y.o. female.  Patient to the ED with concern for elevated blood pressure today. She reports she checks her blood pressure daily and today it has been high, with a maximum reading of 209/76. No chest pain, SOB, LE edema, nausea, diaphoresis, headache. She reports that her blood pressure medications were changed on 01/17/18 from Lisinopril to Losartan. She has not missed any doses.   The history is provided by the patient. No language interpreter was used.    Past Medical History:  Diagnosis Date  . Anemia   . Diabetes mellitus   . High cholesterol   . Hypertension     Patient Active Problem List   Diagnosis Date Noted  . GERD (gastroesophageal reflux disease) 03/02/2015  . DM type 2 (diabetes mellitus, type 2) ([facility]) 02/10/2013  . Anemia 10/18/2010  . Hypertension 10/18/2010  . Hypercholesteremia 10/18/2010    Past Surgical History:  Procedure Laterality Date  . CESAREAN SECTION    . FOOT SURGERY Right    Bone Spur removal  . TONSILLECTOMY    . WISDOM TOOTH EXTRACTION       OB History   None      Home Medications    Prior to Admission medications   Medication Sig Start Date End Date Taking? Authorizing Provider  aspirin EC 81 MG tablet Take 81 mg by mouth daily.   Yes [provider]  losartan (COZAAR) 100 MG tablet Take 1 tablet (100 mg total) by mouth daily. 01/21/18  Yes Jearld Fenton, NP  metFORMIN (GLUCOPHAGE) 500 MG tablet Take 1 tablet (500 mg total) by mouth daily with breakfast. 07/10/17  Yes Baity, Coralie Keens, NP  omeprazole (PRILOSEC) 20 MG capsule Take 1 capsule (20 mg total) by mouth daily. Patient taking differently: Take 20 mg by mouth daily as needed (for heartburn symptoms).  03/02/15  Yes Baity,  Coralie Keens, NP  simvastatin (ZOCOR) 40 MG tablet TAKE 1 TABLET (40 MG TOTAL) BY MOUTH DAILY AT 6 PM. Patient taking differently: Take 40 mg by mouth daily.  05/15/17  Yes Jearld Fenton, NP  Blood Glucose Monitoring Suppl (ONE TOUCH ULTRA 2) w/Device KIT Use as directed to test blood sugar once daily E11.9 08/24/16   Jearld Fenton, NP  glucose blood (ONE TOUCH ULTRA TEST) test strip Use as directed to test blood sugar once daily E11.9 08/24/16   Jearld Fenton, NP  Lancets [employer] ULTRASOFT) lancets Use as directed to test blood sugar once daily E11.9 08/24/16   Jearld Fenton, NP  phenazopyridine (PYRIDIUM) 200 MG tablet Take 1 tablet (200 mg total) by mouth 3 (three) times daily. Patient not taking: Reported on 02/06/2018 01/15/18   Janith Lima, PA-C    Family History Family History  Problem Relation Age of Onset  . Diabetes Mother   . Hypertension Mother   . Cancer Mother 75       Cervical  . Hypertension Father   . Diabetes Maternal Grandmother   . Hypertension Sister   . Diabetes Sister   . Hypertension Brother   . Heart disease Neg Hx   . Stroke Neg Hx     Social History Social History   Tobacco Use  . Smoking status:  Never Smoker  . Smokeless tobacco: Never Used  Substance Use Topics  . Alcohol use: No  . Drug use: No     Allergies   Codeine   Review of Systems Review of Systems  Constitutional: Negative for chills and fever.  HENT: Negative.   Respiratory: Negative.   Cardiovascular: Negative.   Gastrointestinal: Negative.   Musculoskeletal: Negative.   Skin: Negative.   Neurological: Negative.      Physical Exam Updated Vital Signs BP (!) 193/63   Pulse 72   Temp 97.9 F (36.6 C) (Oral)   Resp 18   Ht 5' 1" (1.549 m)   Wt 64.4 kg   SpO2 100%   BMI 26.83 kg/m   Physical Exam  Constitutional: She is oriented to person, place, and time. She appears well-developed and well-nourished.  HENT:  Head: Normocephalic.  Neck: Normal range of  motion. Neck supple.  No carotid bruit.  Cardiovascular: Normal rate and regular rhythm.  No murmur heard. Pulmonary/Chest: Effort normal and breath sounds normal. She has no wheezes. She has no rales. She exhibits no tenderness.  Abdominal: Soft. Bowel sounds are normal. There is no tenderness. There is no rebound and no guarding.  Musculoskeletal: Normal range of motion. She exhibits no edema.  Neurological: She is alert and oriented to person, place, and time. No cranial nerve deficit.  Skin: Skin is warm and dry. No rash noted.  Psychiatric: She has a normal mood and affect.     ED Treatments / Results  Labs (all labs ordered are listed, but only abnormal results are displayed) Labs Reviewed - No data to display  EKG None  Radiology No results found.  Procedures Procedures (including critical care time)  Medications Ordered in ED Medications - No data to display   Initial Impression / Assessment and Plan / ED Course  I have reviewed the triage vital signs and the nursing notes.  Pertinent labs & imaging results that were available during my care of the patient were reviewed by me and considered in my medical decision making (see chart for details).     Patient to ED with concern for elevated blood pressure. No symptoms.   Discussed the need for possible blood pressure medication dose adjustments given she started a new medication. Discussed the importance of follow up with PCP and to continue a regular journal of blood pressure readings to assist her PCP in decision making. Discussed return precautions. Patient reassured and felt appropriate for discharge home.  Final Clinical Impressions(s) / ED Diagnoses   Final diagnoses:  None   1. Elevated blood pressure  ED Discharge Orders    None       Charlann Lange, PA-C 02/07/18 1914    Hayden Rasmussen, MD 02/07/18 [id number]

## 2018-02-06 NOTE — ED Notes (Signed)
Patient verbalizes understanding of discharge instructions. Opportunity for questioning and answers were provided. Armband removed by staff, pt discharged from ED ambulatory.   

## 2018-02-06 NOTE — Discharge Instructions (Addendum)
Call your doctor tomorrow to schedule an appointment for recheck of how your new blood pressure medication is doing. Continue to keep a journal of your blood pressure readings, morning, afternoon and evening and take that with you to the appointment. Return to the emergency department with any new symptoms or concerns anytime.

## 2018-02-06 NOTE — ED Triage Notes (Signed)
Patient states BP has been elevated for the las 12 hours and she is unable to "get it down".

## 2018-02-07 NOTE — Telephone Encounter (Signed)
Just to clarify, is she taking Amlodipine and Losartan or just the Amlodipine?

## 2018-02-07 NOTE — Telephone Encounter (Signed)
Pt called to set up ED follow up- scheduled for Mon 11/25. She said she will take the amlodipien once daily until then. She asked if this was not correct please call to advise.

## 2018-02-08 NOTE — Telephone Encounter (Signed)
Continue Losartan and Amlodipine. Will recheck BP at hospital follow up.

## 2018-02-08 NOTE — Telephone Encounter (Signed)
Pt states that she never picked up her Rx from the 01/17/2018 OV and has restarted.... Pt did give me some readings but she is checking multiple times per day... Pt advised to check BP at least 2 hours after taking meds and later in the afternoon/evening as long as she is not experiencing high BP Sx, headache etc.... Pt reports average systolic is 150s since picking up Amlodipine

## 2018-02-11 ENCOUNTER — Ambulatory Visit: Payer: Medicare Other | Admitting: Internal Medicine

## 2018-02-11 ENCOUNTER — Encounter: Payer: Self-pay | Admitting: Internal Medicine

## 2018-02-11 VITALS — BP 126/64 | HR 71 | Temp 97.8°F | Wt 141.0 lb

## 2018-02-11 DIAGNOSIS — R0981 Nasal congestion: Secondary | ICD-10-CM

## 2018-02-11 DIAGNOSIS — I10 Essential (primary) hypertension: Secondary | ICD-10-CM

## 2018-02-11 DIAGNOSIS — R0600 Dyspnea, unspecified: Secondary | ICD-10-CM | POA: Diagnosis not present

## 2018-02-11 MED ORDER — AMLODIPINE BESYLATE 10 MG PO TABS: 10.0000 mg | ORAL_TABLET | Freq: Every day | ORAL | 0 refills | Status: DC

## 2018-02-11 MED ORDER — LOSARTAN POTASSIUM 100 MG PO TABS: 100.0000 mg | ORAL_TABLET | Freq: Every day | ORAL | 0 refills | Status: DC

## 2018-02-11 NOTE — Assessment & Plan Note (Signed)
Well controlled on Amlodipine and Losartan Reinforced DASH diet and aerobic exercise Discussed risk of myositis/rhabdomyalasis with Amlodipine and Simvastatin but because BP well controlled, she will monitor for myalgias and let me know

## 2018-02-11 NOTE — Patient Instructions (Signed)

## 2018-02-11 NOTE — Progress Notes (Signed)
Subjective:    Patient ID: Vicki Perry, female    DOB: 10/29/1950, 67 y.o.   MRN: [id number]  HPI  Pt presents to the clinic today for ER follow up. She went to the ER 11/20 with c/o elevated blood pressure. Her BP at the ER was 193/63. 3 weeks prior, she was started on Amlodipine in addition to her Lisinopril. The pharmacy called and advised she shouldn't take Amlodipine due to interaction with Simvastatin. She was advised to not pick up Amlodipine, stop Lisinopril and she was started on Losartan 100 mg daily. She never returned in 2 weeks for her BP follow up, instead presented to the ER. In ER, they did not do any labs or ECG. She was discharged home and advised to follow up with her PCP. Since discharge, she has been taking Losartan and Amlodipine, which she picked up and started taking on her own. She denies adverse effects. Her BP today is 126/64. ECG from 02/2014 reviewed.  She also reports nasal congestion. This started 1 week ago. She is not able to blow anything out of her nose. She denies headache, runny nose, ear pain, sore throat or cough. She has tried Coricidin with minimal relief.  Review of Systems  Past Medical History:  Diagnosis Date  . Anemia   . Diabetes mellitus   . High cholesterol   . Hypertension     Current Outpatient Medications  Medication Sig Dispense Refill  . aspirin EC 81 MG tablet Take 81 mg by mouth daily.    . Blood Glucose Monitoring Suppl (ONE TOUCH ULTRA 2) w/Device KIT Use as directed to test blood sugar once daily E11.9 1 each 0  . glucose blood (ONE TOUCH ULTRA TEST) test strip Use as directed to test blood sugar once daily E11.9 100 each 12  . Lancets (ONET[employer] ULTRASOFT) lancets Use as directed to test blood sugar once daily E11.9 100 each 12  . losartan (COZAAR) 100 MG tablet Take 1 tablet (100 mg total) by mouth daily. 30 tablet 0  . metFORMIN (GLUCOPHAGE) 500 MG tablet Take 1 tablet (500 mg total) by mouth daily with breakfast. 90 tablet  2  . omeprazole (PRILOSEC) 20 MG capsule Take 1 capsule (20 mg total) by mouth daily. (Patient taking differently: Take 20 mg by mouth daily as needed (for heartburn symptoms). ) 90 capsule 1  . phenazopyridine (PYRIDIUM) 200 MG tablet Take 1 tablet (200 mg total) by mouth 3 (three) times daily. (Patient not taking: Reported on 02/06/2018) 6 tablet 0  . simvastatin (ZOCOR) 40 MG tablet TAKE 1 TABLET (40 MG TOTAL) BY MOUTH DAILY AT 6 PM. (Patient taking differently: Take 40 mg by mouth daily. ) 90 tablet 3   No current facility-administered medications for this visit.     Allergies  Allergen Reactions  . Codeine Nausea Only    Family History  Problem Relation Age of Onset  . Diabetes Mother   . Hypertension Mother   . Cancer Mother 32       Cervical  . Hypertension Father   . Diabetes Maternal Grandmother   . Hypertension Sister   . Diabetes Sister   . Hypertension Brother   . Heart disease Neg Hx   . Stroke Neg Hx     Social History   Socioeconomic History  . Marital status: Single    Spouse name: Not on file  . Number of children: Not on file  . Years of education: Not on file  .  Highest education level: Not on file  Occupational History  . Not on file  Social Needs  . Financial resource strain: Not on file  . Food insecurity:    Worry: Not on file    Inability: Not on file  . Transportation needs:    Medical: Not on file    Non-medical: Not on file  Tobacco Use  . Smoking status: Never Smoker  . Smokeless tobacco: Never Used  Substance and Sexual Activity  . Alcohol use: No  . Drug use: No  . Sexual activity: Not on file  Lifestyle  . Physical activity:    Days per week: Not on file    Minutes per session: Not on file  . Stress: Not on file  Relationships  . Social connections:    Talks on phone: Not on file    Gets together: Not on file    Attends religious service: Not on file    Active member of club or organization: Not on file    Attends meetings  of clubs or organizations: Not on file    Relationship status: Not on file  . Intimate partner violence:    Fear of current or ex partner: Not on file    Emotionally abused: Not on file    Physically abused: Not on file    Forced sexual activity: Not on file  Other Topics Concern  . Not on file  Social History Narrative  . Not on file     Constitutional: Denies fever, malaise, fatigue, headache or abrupt weight changes.  HEENT: Pt reports nasal congestion. Denies facial pain, ear pain, runny nose or sore throat. Respiratory: Denies difficulty breathing, shortness of breath, cough or sputum production.   Cardiovascular: Denies chest pain, chest tightness, palpitations or swelling in the hands or feet.  Neurological: Denies dizziness, difficulty with memory, difficulty with speech or problems with balance and coordination.    No other specific complaints in a complete review of systems (except as listed in HPI above).     Objective:   Physical Exam   BP 126/64   Pulse 71   Temp 97.8 F (36.6 C) (Oral)   Wt 141 lb (64 kg)   SpO2 98%   BMI 26.64 kg/m  Wt Readings from Last 3 Encounters:  02/11/18 141 lb (64 kg)  02/06/18 142 lb (64.4 kg)  01/17/18 140 lb (63.5 kg)    General: Appears her stated age, well developed, well nourished in NAD. HEENT: No sinus tenderness noted. Ears with bilateral impacted cerumen. Nose pink and dry, septum midline. Throat erythematous with PND. No tonsillar swelling, exudate or mass noted. Neck: No lymphadenopathy noted. Cardiovascular: Normal rate and rhythm. S1,S2 noted.  No murmur, rubs or gallops noted.  Pulmonary/Chest: Normal effort and positive vesicular breath sounds. No respiratory distress. No wheezes, rales or ronchi noted.  Neurological: Alert and oriented.    BMET    Component Value Date/Time   NA 140 05/03/2017 1004   K 4.4 05/03/2017 1004   CL 106 05/03/2017 1004   CO2 29 05/03/2017 1004   GLUCOSE 103 (H) 05/03/2017 1004     BUN 15 05/03/2017 1004   CREATININE 1.00 05/03/2017 1004   CALCIUM 9.3 05/03/2017 1004    Lipid Panel     Component Value Date/Time   CHOL 127 05/03/2017 1004   TRIG 78.0 05/03/2017 1004   HDL 47.10 05/03/2017 1004   CHOLHDL 3 05/03/2017 1004   VLDL 15.6 05/03/2017 1004   LDLCALC 64  05/03/2017 1004    CBC    Component Value Date/Time   WBC 7.0 05/03/2017 1004   RBC 4.40 05/03/2017 1004   HGB 12.2 05/03/2017 1004   HCT 36.9 05/03/2017 1004   PLT 282.0 05/03/2017 1004   MCV 83.9 05/03/2017 1004   MCHC 33.1 05/03/2017 1004   RDW 12.8 05/03/2017 1004    Hgb A1C Lab Results  Component Value Date   HGBA1C 6.3 05/03/2017           Assessment & Plan:   ER Follow Up for HTN:  At goal with Amlodipine and Losartan Advised her to let me know if she develops persistent myalgias Reinforced DASH diet and aerobic exercise  Nasal Congestion, PND:  Start Flonase and Claritin OTC  RTC in 3 moths for your annual exam Webb Silversmith, NP

## 2018-02-11 NOTE — Telephone Encounter (Signed)
Pt signed a med rec release today for [facility] to release most recent eye exam

## 2018-03-01 ENCOUNTER — Other Ambulatory Visit: Payer: Self-pay | Admitting: Internal Medicine

## 2018-03-01 DIAGNOSIS — Z1231 Encounter for screening mammogram for malignant neoplasm of breast: Secondary | ICD-10-CM

## 2018-03-05 ENCOUNTER — Telehealth: Payer: Self-pay

## 2018-03-05 ENCOUNTER — Other Ambulatory Visit: Payer: Self-pay | Admitting: Internal Medicine

## 2018-03-05 NOTE — Telephone Encounter (Signed)
Pt is going to have cataract surgery on 03/21/2018.pt was told by surgeons office nothing after midnight the night before cataract surgery. Pt usually takes Amlodipine 10 mg and Losartan 100 mg in AM. Pt wants to know if can take the 2 BP meds the night before the cataract surgery around 11 PM. pts BP usually 140's /68. Now BP is 163/63 P 92 but pt has been rushing. Pt request cb. Pt last seen 02/11/18.

## 2018-03-05 NOTE — Telephone Encounter (Signed)
I would recommend she take the med with a sip of water. But she needs to verify this with the eye doctor.

## 2018-03-08 NOTE — Telephone Encounter (Signed)
Pt left v/m requesting Melanie CMA to give pt cb.

## 2018-03-08 NOTE — Telephone Encounter (Signed)
Pt is aware as instructed 

## 2018-03-22 HISTORY — PX: CATARACT EXTRACTION, BILATERAL: SHX1313

## 2018-04-02 ENCOUNTER — Other Ambulatory Visit: Payer: Self-pay | Admitting: Internal Medicine

## 2018-04-08 ENCOUNTER — Ambulatory Visit
Admission: RE | Admit: 2018-04-08 | Discharge: 2018-04-08 | Disposition: A | Discharge status: Home / Self Care | Payer: Medicare Other | Source: Ambulatory Visit | Attending: Internal Medicine | Admitting: Internal Medicine

## 2018-04-08 DIAGNOSIS — Z1231 Encounter for screening mammogram for malignant neoplasm of breast: Secondary | ICD-10-CM

## 2018-05-06 ENCOUNTER — Encounter: Payer: Medicare Other | Admitting: Internal Medicine

## 2018-05-15 ENCOUNTER — Other Ambulatory Visit: Payer: Self-pay | Admitting: Internal Medicine

## 2018-06-18 ENCOUNTER — Encounter

## 2018-06-18 ENCOUNTER — Encounter: Payer: Medicare Other | Admitting: Internal Medicine

## 2018-06-24 ENCOUNTER — Telehealth: Payer: Self-pay

## 2018-06-24 MED ORDER — AMLODIPINE BESYLATE 10 MG PO TABS: 10.0000 mg | ORAL_TABLET | Freq: Every day | ORAL | 0 refills | Status: DC

## 2018-06-24 NOTE — Telephone Encounter (Signed)
I never received refill for amlodipine, just Losartan... Rx sent through e-scribe  Pt is aware

## 2018-06-24 NOTE — Telephone Encounter (Signed)
Pt left v/m requesting cb about refill for amlodipine. Pharmacy advised pt has faxed several request for amlodipine refill.Please advise.

## 2018-07-09 ENCOUNTER — Other Ambulatory Visit: Payer: Self-pay | Admitting: Internal Medicine

## 2018-07-12 ENCOUNTER — Other Ambulatory Visit: Payer: Self-pay | Admitting: Internal Medicine

## 2018-08-07 ENCOUNTER — Other Ambulatory Visit: Payer: Self-pay | Admitting: Podiatry

## 2018-08-07 ENCOUNTER — Ambulatory Visit (INDEPENDENT_AMBULATORY_CARE_PROVIDER_SITE_OTHER): Payer: Medicare Other

## 2018-08-07 ENCOUNTER — Ambulatory Visit: Payer: Medicare Other | Admitting: Podiatry

## 2018-08-07 ENCOUNTER — Encounter: Payer: Self-pay | Admitting: Podiatry

## 2018-08-07 ENCOUNTER — Other Ambulatory Visit: Payer: Self-pay

## 2018-08-07 VITALS — Temp 96.3°F

## 2018-08-07 DIAGNOSIS — M79672 Pain in left foot: Secondary | ICD-10-CM

## 2018-08-07 DIAGNOSIS — M722 Plantar fascial fibromatosis: Secondary | ICD-10-CM

## 2018-08-07 DIAGNOSIS — M79671 Pain in right foot: Secondary | ICD-10-CM

## 2018-08-07 DIAGNOSIS — S9001XA Contusion of right ankle, initial encounter: Secondary | ICD-10-CM | POA: Diagnosis not present

## 2018-08-07 MED ORDER — TRIAMCINOLONE ACETONIDE 10 MG/ML IJ SUSP
10.0000 mg | Freq: Once | INTRAMUSCULAR | Status: AC
  Administered 2018-08-07: 10 mg

## 2018-08-08 NOTE — Progress Notes (Signed)
Subjective:   Patient ID: Vicki Perry, female   DOB: 68 y.o.   MRN: [id number]   HPI Patient presents with 2 problems stating she twisted her ankle several weeks ago and she has swelling that she was concerned about and she has developed a lot of pain in the arch of both feet over the last few weeks stating that she was doing real well for about 7 8 months   ROS      Objective:  Physical Exam  Neurovascular status intact with patient found to have quite a bit of inflammation in the mid arch area bilateral with fluid buildup and is noted to have swelling in the right ankle with diminished range of motion but no ligamentous instability with inversion eversion     Assessment:  Acute mid arch fasciitis condition bilateral along with ankle pain right secondary to injury with mild swelling mechanism     Plan:  H&P condition reviewed and recommended compression therapy elevation and ice for the ankle along with supportive shoe gear and did sterile prep and injected the mid arch area bilateral 3 mg Kenalog 5 mg Xylocaine applied sterile dressings and will be seen back as symptoms indicate  X-rays indicated there is small spurs but no indications of fracture or diastases injury of the right ankle

## 2018-08-25 ENCOUNTER — Other Ambulatory Visit: Payer: Self-pay | Admitting: Internal Medicine

## 2018-09-03 ENCOUNTER — Other Ambulatory Visit: Payer: Self-pay

## 2018-09-03 ENCOUNTER — Encounter: Payer: Self-pay | Admitting: Internal Medicine

## 2018-09-03 ENCOUNTER — Ambulatory Visit (INDEPENDENT_AMBULATORY_CARE_PROVIDER_SITE_OTHER): Payer: Medicare Other | Admitting: Internal Medicine

## 2018-09-03 ENCOUNTER — Telehealth: Payer: Self-pay | Admitting: Internal Medicine

## 2018-09-03 VITALS — BP 128/78 | HR 68 | Temp 97.9°F | Ht 60.5 in | Wt 146.0 lb

## 2018-09-03 DIAGNOSIS — K219 Gastro-esophageal reflux disease without esophagitis: Secondary | ICD-10-CM | POA: Diagnosis not present

## 2018-09-03 DIAGNOSIS — D509 Iron deficiency anemia, unspecified: Secondary | ICD-10-CM | POA: Diagnosis not present

## 2018-09-03 DIAGNOSIS — E7800 Pure hypercholesterolemia, unspecified: Secondary | ICD-10-CM | POA: Diagnosis not present

## 2018-09-03 DIAGNOSIS — I10 Essential (primary) hypertension: Secondary | ICD-10-CM | POA: Diagnosis not present

## 2018-09-03 DIAGNOSIS — Z0000 Encounter for general adult medical examination without abnormal findings: Secondary | ICD-10-CM | POA: Diagnosis not present

## 2018-09-03 DIAGNOSIS — E119 Type 2 diabetes mellitus without complications: Secondary | ICD-10-CM

## 2018-09-03 LAB — COMPREHENSIVE METABOLIC PANEL
ALT: 13 U/L (ref 0–35)
AST: 16 U/L (ref 0–37)
Albumin: 4.2 g/dL (ref 3.5–5.2)
Alkaline Phosphatase: 84 U/L (ref 39–117)
BUN: 13 mg/dL (ref 6–23)
CO2: 28 mEq/L (ref 19–32)
Calcium: 9.2 mg/dL (ref 8.4–10.5)
Chloride: 104 mEq/L (ref 96–112)
Creatinine, Ser: 1.01 mg/dL (ref 0.40–1.20)
GFR: 65.51 mL/min (ref >60.00)
Glucose, Bld: 110 mg/dL — ABNORMAL HIGH (ref 70–99)
Potassium: 4.8 mEq/L (ref 3.5–5.1)
Sodium: 139 mEq/L (ref 135–145)
Total Bilirubin: 0.6 mg/dL (ref 0.2–1.2)
Total Protein: 6.6 g/dL (ref 6.0–8.3)

## 2018-09-03 LAB — T4, FREE: Free T4: 0.8 ng/dL (ref 0.60–1.60)

## 2018-09-03 LAB — LIPID PANEL
Cholesterol: 137 mg/dL (ref 0–200)
HDL: 49.3 mg/dL (ref >39.00)
LDL Cholesterol: 68 mg/dL (ref 0–99)
NonHDL: 87.63
Total CHOL/HDL Ratio: 3
Triglycerides: 99 mg/dL (ref 0.0–149.0)
VLDL: 19.8 mg/dL (ref 0.0–40.0)

## 2018-09-03 LAB — CBC
HCT: 37 % (ref 36.0–46.0)
Hemoglobin: 12.2 g/dL (ref 12.0–15.0)
MCHC: 32.9 g/dL (ref 30.0–36.0)
MCV: 84.4 fl (ref 78.0–100.0)
Platelets: 283 10*3/uL (ref 150.0–400.0)
RBC: 4.39 Mil/uL (ref 3.87–5.11)
RDW: 12.7 % (ref 11.5–15.5)
WBC: 7.4 10*3/uL (ref 4.0–10.5)

## 2018-09-03 LAB — HEMOGLOBIN A1C: Hgb A1c MFr Bld: 6.7 % — ABNORMAL HIGH (ref 4.6–6.5)

## 2018-09-03 LAB — TSH: TSH: 1.63 u[IU]/mL (ref 0.35–4.50)

## 2018-09-03 LAB — VITAMIN D 25 HYDROXY (VIT D DEFICIENCY, FRACTURES): VITD: 29.91 ng/mL — ABNORMAL LOW (ref 30.00–100.00)

## 2018-09-03 NOTE — Patient Instructions (Signed)
Health Maintenance After Age 70 After age 70, you are at a higher risk for certain long-term diseases and infections as well as injuries from falls. Falls are a major cause of broken bones and head injuries in people who are older than age 70. Getting regular preventive care can help to keep you healthy and well. Preventive care includes getting regular testing and making lifestyle changes as recommended by your health care provider. Talk with your health care provider about:  Which screenings and tests you should have. A screening is a test that checks for a disease when you have no symptoms.  A diet and exercise plan that is right for you. What should I know about screenings and tests to prevent falls? Screening and testing are the best ways to find a health problem early. Early diagnosis and treatment give you the best chance of managing medical conditions that are common after age 70. Certain conditions and lifestyle choices may make you more likely to have a fall. Your health care provider may recommend:  Regular vision checks. Poor vision and conditions such as cataracts can make you more likely to have a fall. If you wear glasses, make sure to get your prescription updated if your vision changes.  Medicine review. Work with your health care provider to regularly review all of the medicines you are taking, including over-the-counter medicines. Ask your health care provider about any side effects that may make you more likely to have a fall. Tell your health care provider if any medicines that you take make you feel dizzy or sleepy.  Osteoporosis screening. Osteoporosis is a condition that causes the bones to get weaker. This can make the bones weak and cause them to break more easily.  Blood pressure screening. Blood pressure changes and medicines to control blood pressure can make you feel dizzy.  Strength and balance checks. Your health care provider may recommend certain tests to check your  strength and balance while standing, walking, or changing positions.  Foot health exam. Foot pain and numbness, as well as not wearing proper footwear, can make you more likely to have a fall.  Depression screening. You may be more likely to have a fall if you have a fear of falling, feel emotionally low, or feel unable to do activities that you used to do.  Alcohol use screening. Using too much alcohol can affect your balance and may make you more likely to have a fall. What actions can I take to lower my risk of falls? General instructions  Talk with your health care provider about your risks for falling. Tell your health care provider if: ? You fall. Be sure to tell your health care provider about all falls, even ones that seem minor. ? You feel dizzy, sleepy, or off-balance.  Take over-the-counter and prescription medicines only as told by your health care provider. These include any supplements.  Eat a healthy diet and maintain a healthy weight. A healthy diet includes low-fat dairy products, low-fat (lean) meats, and fiber from whole grains, beans, and lots of fruits and vegetables. Home safety  Remove any tripping hazards, such as rugs, cords, and clutter.  Install safety equipment such as grab bars in bathrooms and safety rails on stairs.  Keep rooms and walkways well-lit. Activity   Follow a regular exercise program to stay fit. This will help you maintain your balance. Ask your health care provider what types of exercise are appropriate for you.  If you need a cane or   walker, use it as recommended by your health care provider.  Wear supportive shoes that have nonskid soles. Lifestyle  Do not drink alcohol if your health care provider tells you not to drink.  If you drink alcohol, limit how much you have: ? 0-1 drink a day for women. ? 0-2 drinks a day for men.  Be aware of how much alcohol is in your drink. In the [place] one drink equals one typical bottle of beer (12  oz), one-half glass of wine (5 oz), or one shot of hard liquor (1 oz).  Do not use any products that contain nicotine or tobacco, such as cigarettes and e-cigarettes. If you need help quitting, ask your health care provider. Summary  Having a healthy lifestyle and getting preventive care can help to protect your health and wellness after age 70.  Screening and testing are the best way to find a health problem early and help you avoid having a fall. Early diagnosis and treatment give you the best chance for managing medical conditions that are more common for people who are older than age 70.  Falls are a major cause of broken bones and head injuries in people who are older than age 70. Take precautions to prevent a fall at home.  Work with your health care provider to learn what changes you can make to improve your health and wellness and to prevent falls. This information is not intended to replace advice given to you by your health care provider. Make sure you discuss any questions you have with your health care provider. Document Released: 01/17/2017 Document Revised: 01/17/2017 Document Reviewed: 01/17/2017 Elsevier Interactive Patient Education  2019 [employer]  

## 2018-09-03 NOTE — Assessment & Plan Note (Signed)
A1C today No microalbumin secondary to ARB therapy Encouraged her to consume a low carb diet Continue Metformin Foot exam today Encouraged yearly eye exam Flu and pneumovax UTD

## 2018-09-03 NOTE — Assessment & Plan Note (Signed)
CBC today Will monitor 

## 2018-09-03 NOTE — Progress Notes (Signed)
HPI:  Patient presents to the clinic today for her subsequent annual medicare wellness exam.  She is also due to follow up on her chronic medical conditions.  GERD: Triggered by meat. She takes Omeprazole 2 x week with good relief.  There is no Upper GI on file.  HTN: BP today is 128/78.  She is taking Amlodipine and Losartan as prescribed.  ECG from 02/2014 reviewed.  HLD: Her last LDL was 64, 04/2017.  She denies myalgias on Simvastatin.  She tries to consume a low fat diet.  Anemia: Her last H/H was 12.2/36.9, 04/2017.  She is not currently taking any iron supplement.  She denies fatigue, cold intolerance, shortness of breath.  DM 2: Her last A1C was 6.3, 04/2017.  She is currently taking Metformin as prescribed.  She does not check sugars. She routinely checks her feet.  Her last eye exam was  05/2018.  Past Medical History:  Diagnosis Date  . Anemia   . Diabetes mellitus   . High cholesterol   . Hypertension     Current Outpatient Medications  Medication Sig Dispense Refill  . amLODipine (NORVASC) 10 MG tablet Take 1 tablet (10 mg total) by mouth daily. 90 tablet 0  . aspirin EC 81 MG tablet Take 81 mg by mouth daily.    . Blood Glucose Monitoring Suppl (ONE TOUCH ULTRA 2) w/Device KIT Use as directed to test blood sugar once daily E11.9 1 each 0  . glucose blood (ONE TOUCH ULTRA TEST) test strip Use as directed to test blood sugar once daily E11.9 100 each 12  . Lancets (ONETOUCH ULTRASOFT) lancets Use as directed to test blood sugar once daily E11.9 100 each 12  . losartan (COZAAR) 100 MG tablet TAKE 1 TABLET BY MOUTH EVERY DAY 90 tablet 0  . metFORMIN (GLUCOPHAGE) 500 MG tablet TAKE 1 TABLET BY MOUTH EVERY DAY WITH BREAKFAST 90 tablet 0  . omeprazole (PRILOSEC) 20 MG capsule Take 1 capsule (20 mg total) by mouth daily. (Patient taking differently: Take 20 mg by mouth daily as needed (for heartburn symptoms). ) 90 capsule 1  . simvastatin (ZOCOR) 40 MG tablet TAKE 1 TABLET (40 MG  TOTAL) BY MOUTH DAILY AT 6 PM. 90 tablet 0   No current facility-administered medications for this visit.     Allergies  Allergen Reactions  . Codeine Nausea Only    Family History  Problem Relation Age of Onset  . Diabetes Mother   . Hypertension Mother   . Cancer Mother 62       Cervical  . Hypertension Father   . Diabetes Maternal Grandmother   . Hypertension Sister   . Diabetes Sister   . Hypertension Brother   . Heart disease Neg Hx   . Stroke Neg Hx     Social History   Socioeconomic History  . Marital status: Single    Spouse name: Not on file  . Number of children: Not on file  . Years of education: Not on file  . Highest education level: Not on file  Occupational History  . Not on file  Social Needs  . Financial resource strain: Not on file  . Food insecurity    Worry: Not on file    Inability: Not on file  . Transportation needs    Medical: Not on file    Non-medical: Not on file  Tobacco Use  . Smoking status: Never Smoker  . Smokeless tobacco: Never Used  Substance and Sexual Activity  .  Alcohol use: No  . Drug use: No  . Sexual activity: Not on file  Lifestyle  . Physical activity    Days per week: Not on file    Minutes per session: Not on file  . Stress: Not on file  Relationships  . Social Herbalist on phone: Not on file    Gets together: Not on file    Attends religious service: Not on file    Active member of club or organization: Not on file    Attends meetings of clubs or organizations: Not on file    Relationship status: Not on file  . Intimate partner violence    Fear of current or ex partner: Not on file    Emotionally abused: Not on file    Physically abused: Not on file    Forced sexual activity: Not on file  Other Topics Concern  . Not on file  Social History Narrative  . Not on file     Hospitiliaztions: None  Health Maintenance:    Flu: 04/2017  Tetanus: 08/2013  Pneumovax: 04/2017  Prevnar:  08/2014  Zostavax: never  Shingrix: never  Mammogram:03/2018  Pap Smear: 09/2010  Bone Density: 03/2016  Colon Screening: 05/2006  Eye Doctor: annually  Dental Exam: as needed   Providers:   PCP: Webb Silversmith, NP-C  Podiatry: Dr. Paulla Dolly    I have personally reviewed and have noted:  1. The patient's medical and social history 2. Their use of alcohol, tobacco or illicit drugs 3. Their current medications and supplements 4. The patient's functional ability including ADL's, fall risks, home safety risks and hearing or visual impairment. 5. Diet and physical activities 6. Evidence for depression or mood disorder  Subjective:   Review of Systems:   Constitutional: Denies fever, malaise, fatigue, headache or abrupt weight changes.  HEENT: Denies eye pain, eye redness, ear pain, ringing in the ears, wax buildup, runny nose, nasal congestion, bloody nose, or sore throat. Respiratory: Denies difficulty breathing, shortness of breath, cough or sputum production.   Cardiovascular: Denies chest pain, chest tightness, palpitations or swelling in the hands or feet.  Gastrointestinal: Denies abdominal pain, bloating, constipation, diarrhea or blood in the stool.  GU: Denies urgency, frequency, pain with urination, burning sensation, blood in urine, odor or discharge. Musculoskeletal: Denies decrease in range of motion, difficulty with gait, muscle pain or joint pain and swelling.  Skin: Denies redness, rashes, lesions or ulcercations.  Neurological: Pt reports burning and tingling in her feet. Denies dizziness, difficulty with memory, difficulty with speech or problems with balance and coordination.  Psych: Denies anxiety, depression, SI/HI.  No other specific complaints in a complete review of systems (except as listed in HPI above).  Objective:  PE:   BP 128/78   Pulse 68   Temp 97.9 F (36.6 C) (Oral)   Ht 5' 0.5" (1.537 m)   Wt 146 lb (66.2 kg)   SpO2 98%   BMI 28.04 kg/m   Wt  Readings from Last 3 Encounters:  02/11/18 141 lb (64 kg)  02/06/18 142 lb (64.4 kg)  01/17/18 140 lb (63.5 kg)    General: Appears her stated age, well developed, well nourished in NAD. Skin: Warm, dry and intact. No ulcerations noted. HEENT: Head: normal shape and size; Eyes: sclera white, no icterus, conjunctiva pink, PERRLA and EOMs intact; Ears: Tm's gray and intact, normal light reflex;  Neck: Neck supple, trachea midline. No masses, lumps or thyromegaly present.  Cardiovascular: Normal  rate and rhythm. S1,S2 noted.  No murmur, rubs or gallops noted. No JVD or BLE edema. No carotid bruits noted. Pulmonary/Chest: Normal effort and positive vesicular breath sounds. No respiratory distress. No wheezes, rales or ronchi noted.  Abdomen: Soft and nontender. Normal bowel sounds. No distention or masses noted. Liver, spleen and kidneys non palpable. Musculoskeletal:  Strength 5/5 BUE/BLE. No signs of joint swelling.  Neurological: Alert and oriented. Cranial nerves II-XII grossly intact. Coordination normal.  Psychiatric: Mood and affect normal. Behavior is normal. Judgment and thought content normal.     BMET    Component Value Date/Time   NA 140 05/03/2017 1004   K 4.4 05/03/2017 1004   CL 106 05/03/2017 1004   CO2 29 05/03/2017 1004   GLUCOSE 103 (H) 05/03/2017 1004   BUN 15 05/03/2017 1004   CREATININE 1.00 05/03/2017 1004   CALCIUM 9.3 05/03/2017 1004    Lipid Panel     Component Value Date/Time   CHOL 127 05/03/2017 1004   TRIG 78.0 05/03/2017 1004   HDL 47.10 05/03/2017 1004   CHOLHDL 3 05/03/2017 1004   VLDL 15.6 05/03/2017 1004   LDLCALC 64 05/03/2017 1004    CBC    Component Value Date/Time   WBC 7.0 05/03/2017 1004   RBC 4.40 05/03/2017 1004   HGB 12.2 05/03/2017 1004   HCT 36.9 05/03/2017 1004   PLT 282.0 05/03/2017 1004   MCV 83.9 05/03/2017 1004   MCHC 33.1 05/03/2017 1004   RDW 12.8 05/03/2017 1004    Hgb A1C Lab Results  Component Value Date    HGBA1C 6.3 05/03/2017      Assessment and Plan:   Medicare Annual Wellness Visit:  Diet: She eats very little meat. She consumes fruits and veggies daily. She rarely eats fried foods. She drinks mostly coffee, water Physical activity: Walking 3 x week Depression/mood screen: Negative, PHQ 9 score of 0 Hearing: Intact to whispered voice Visual acuity: Grossly normal, performs annual eye exam  ADLs: Capable Fall risk: None Home safety: Good Cognitive evaluation: Intact to orientation, naming, recall and repetition EOL planning: No adv directives, full code/ I agree  Preventative Medicine: Encouraged her to get a flu shot in the fall. Tetanus, pneumovax, prevnar UTD. She declines zostovax or shingrix. Mammogram and bone density UTD. She no longer wants cervical cancer screening. She has the Cologuard kit at home, advised her to get this done and send it back in. Encouraged her to consume a balanced diet and exercise regimen. Advised her to see an eye doctor and dentist annually. Will check CBC, CMET, TSH, Free T4, Lipid, A1C and Vit D.   Next appointment: 6 months, follow up chronic conditions   Webb Silversmith, NP

## 2018-09-03 NOTE — Assessment & Plan Note (Signed)
Continue Amlodipine and Losartan CMET today Reinforced DASH diet

## 2018-09-03 NOTE — Telephone Encounter (Signed)
Best number [phone] Pt called [facility] they will approve   One touch or accu chek  [facility] [place]

## 2018-09-03 NOTE — Assessment & Plan Note (Signed)
Discussed avoiding foods that trigger reflux Continue Omeprazole for now CBC, CMET

## 2018-09-03 NOTE — Assessment & Plan Note (Signed)
Encouraged her to consume a low fat diet CMET and Lipid profile today Continue Simvastatin for now

## 2018-09-04 MED ORDER — ONETOUCH VERIO W/DEVICE KIT: 1.0000 | PACK | Freq: Once | 0 refills | Status: AC

## 2018-09-04 MED ORDER — ONETOUCH VERIO VI STRP: 1.0000 | ORAL_STRIP | Freq: Two times a day (BID) | 2 refills | Status: DC

## 2018-09-04 NOTE — Telephone Encounter (Signed)
Meter and strips sent to [facility]

## 2018-09-12 ENCOUNTER — Telehealth: Payer: Self-pay

## 2018-09-12 MED ORDER — ONETOUCH VERIO W/DEVICE KIT: 1.0000 | PACK | Freq: Once | 0 refills | Status: AC

## 2018-09-12 NOTE — Addendum Note (Signed)
Addended by: Lurlean Nanny on: 09/12/2018 05:14 PM   Modules accepted: Orders

## 2018-09-12 NOTE — Telephone Encounter (Signed)
[place] Night - Client Nonclinical Telephone Record AccessNurse Client [place] Night - Client Client Site [place] Physician Webb Silversmith - NP Contact Type Call Who Is Calling Patient / Member / Family / Caregiver Caller Name [place] Phone Number [phone] Call Type Message Only Information Provided Reason for Call Returning a Call from the Office Initial Comment Caller reports she is returning a call to [facility]. Additional Comment Provided Office Hours. Call Closed By: Erich Montane Transaction Date/Time: 09/11/2018 5:06:11 PM (ET)

## 2018-09-15 ENCOUNTER — Other Ambulatory Visit: Payer: Self-pay | Admitting: Internal Medicine

## 2018-09-25 ENCOUNTER — Telehealth: Payer: Self-pay | Admitting: Internal Medicine

## 2018-09-25 NOTE — Telephone Encounter (Signed)
Patient called and said she needs a referral Dr.Ajay Kumar for her diabetes.  Patient said she has seen him before, but it's been over 3 years and Dr.Kumar's office told patient she needs a referral.  Patient can go to appointment anytime.

## 2018-09-26 NOTE — Telephone Encounter (Signed)
Her diabetes is not uncontrolled, this is something I am able to manage. Does she just prefer to see an endocrinologist for management of her diabetes?

## 2018-10-01 ENCOUNTER — Ambulatory Visit: Payer: Medicare Other | Admitting: Internal Medicine

## 2018-10-01 ENCOUNTER — Encounter: Payer: Self-pay | Admitting: Internal Medicine

## 2018-10-01 ENCOUNTER — Other Ambulatory Visit: Payer: Self-pay

## 2018-10-01 VITALS — BP 140/60 | HR 88 | Temp 98.2°F | Wt 149.0 lb

## 2018-10-01 DIAGNOSIS — M7989 Other specified soft tissue disorders: Secondary | ICD-10-CM

## 2018-10-01 DIAGNOSIS — I10 Essential (primary) hypertension: Secondary | ICD-10-CM | POA: Diagnosis not present

## 2018-10-01 MED ORDER — HYDROCHLOROTHIAZIDE 25 MG PO TABS: 25.0000 mg | ORAL_TABLET | Freq: Every day | ORAL | 0 refills | Status: DC

## 2018-10-01 NOTE — Patient Instructions (Signed)

## 2018-10-01 NOTE — Progress Notes (Signed)
Subjective:    Patient ID: Vicki Perry, female    DOB: Dec 07, 1950, 68 y.o.   MRN: [id number]  HPI  Patient presents to the clinic today with c/o swelling in her right ankles and foot. This started 1-2 weeks ago. It started swelling every few days but it has progressed to every day and seems to be worsening.  She denies pain but says her right foot feels really tight.  She denies chest pain, chest tightness, shortness of breath, or cough.  She denies redness or warmth of the right lower extremity. Her recent BPs at home range 120-130s/70s-80s.  Her BP today is 140/60.  Denies any personal or family history of blood clots.  She would also like a referral to Dr. Dwyane Dee for management of her diabetes. Her diabetes is not uncontrolled but she would like to see Dr. Dwyane Dee as she has seen him in the past.  Review of Systems      Past Medical History:  Diagnosis Date  . Anemia   . Diabetes mellitus   . High cholesterol   . Hypertension     Current Outpatient Medications  Medication Sig Dispense Refill  . amLODipine (NORVASC) 10 MG tablet TAKE 1 TABLET BY MOUTH EVERY DAY 90 tablet 1  . aspirin EC 81 MG tablet Take 81 mg by mouth daily.    . calcium gluconate 500 MG tablet Take 1 tablet by mouth daily.    . cholecalciferol (VITAMIN D3) 25 MCG (1000 UT) tablet Take 1,000 Units by mouth daily.    Marland Kitchen glucose blood (ONETOUCH VERIO) test strip 1 each by Other route 2 (two) times a day. 200 each 2  . Lancets (ONETOUCH ULTRASOFT) lancets Use as directed to test blood sugar once daily E11.9 100 each 12  . losartan (COZAAR) 100 MG tablet TAKE 1 TABLET BY MOUTH EVERY DAY 90 tablet 0  . metFORMIN (GLUCOPHAGE) 500 MG tablet TAKE 1 TABLET BY MOUTH EVERY DAY WITH BREAKFAST 90 tablet 0  . omeprazole (PRILOSEC) 20 MG capsule Take 1 capsule (20 mg total) by mouth daily. (Patient taking differently: Take 20 mg by mouth daily as needed (for heartburn symptoms). ) 90 capsule 1  . simvastatin (ZOCOR) 40 MG  tablet TAKE 1 TABLET (40 MG TOTAL) BY MOUTH DAILY AT 6 PM. 90 tablet 0  . hydrochlorothiazide (HYDRODIURIL) 25 MG tablet Take 1 tablet (25 mg total) by mouth daily. 30 tablet 0   No current facility-administered medications for this visit.     Allergies  Allergen Reactions  . Codeine Nausea Only    Family History  Problem Relation Age of Onset  . Diabetes Mother   . Hypertension Mother   . Cancer Mother 37       Cervical  . Hypertension Father   . Diabetes Maternal Grandmother   . Hypertension Sister   . Diabetes Sister   . Hypertension Brother   . Heart disease Neg Hx   . Stroke Neg Hx     Social History   Socioeconomic History  . Marital status: Single    Spouse name: Not on file  . Number of children: Not on file  . Years of education: Not on file  . Highest education level: Not on file  Occupational History  . Not on file  Social Needs  . Financial resource strain: Not on file  . Food insecurity    Worry: Not on file    Inability: Not on file  . Transportation needs  Medical: Not on file    Non-medical: Not on file  Tobacco Use  . Smoking status: Never Smoker  . Smokeless tobacco: Never Used  Substance and Sexual Activity  . Alcohol use: No  . Drug use: No  . Sexual activity: Not on file  Lifestyle  . Physical activity    Days per week: Not on file    Minutes per session: Not on file  . Stress: Not on file  Relationships  . Social Musicianconnections    Talks on phone: Not on file    Gets together: Not on file    Attends religious service: Not on file    Active member of club or organization: Not on file    Attends meetings of clubs or organizations: Not on file    Relationship status: Not on file  . Intimate partner violence    Fear of current or ex partner: Not on file    Emotionally abused: Not on file    Physically abused: Not on file    Forced sexual activity: Not on file  Other Topics Concern  . Not on file  Social History Narrative  . Not  on file     Constitutional: Denies fever, malaise, fatigue, headache or abrupt weight changes.  Respiratory: Denies difficulty breathing, shortness of breath, cough or sputum production.   Cardiovascular: Pt reports RLE swelling.  Denies chest pain, chest tightness, palpitations.  Skin: Denies redness, rashes, lesions or ulcercations.   No other specific complaints in a complete review of systems (except as listed in HPI above).  Objective:   Physical Exam  BP 140/60   Pulse 88   Temp 98.2 F (36.8 C) (Skin)   Wt 149 lb (67.6 kg)   SpO2 98%   BMI 28.62 kg/m  Wt Readings from Last 3 Encounters:  10/01/18 149 lb (67.6 kg)  09/03/18 146 lb (66.2 kg)  02/11/18 141 lb (64 kg)    General: Appears her stated age, well developed, well nourished in NAD. Skin: Warm, dry and intact. No redness or warmth noted. Cardiovascular: Normal rate and rhythm. S1,S2 noted.  Slight murmur noted.  No rubs or gallops noted. No JVD noted.  1+ pitting RLE edema. Pedal pulses 2+ bilaterally. Pulmonary/Chest: Normal effort and positive vesicular breath sounds. No respiratory distress. No wheezes, rales or ronchi noted.     BMET    Component Value Date/Time   NA 139 09/03/2018 0851   K 4.8 09/03/2018 0851   CL 104 09/03/2018 0851   CO2 28 09/03/2018 0851   GLUCOSE 110 (H) 09/03/2018 0851   BUN 13 09/03/2018 0851   CREATININE 1.01 09/03/2018 0851   CALCIUM 9.2 09/03/2018 0851    Lipid Panel     Component Value Date/Time   CHOL 137 09/03/2018 0851   TRIG 99.0 09/03/2018 0851   HDL 49.30 09/03/2018 0851   CHOLHDL 3 09/03/2018 0851   VLDL 19.8 09/03/2018 0851   LDLCALC 68 09/03/2018 0851    CBC    Component Value Date/Time   WBC 7.4 09/03/2018 0851   RBC 4.39 09/03/2018 0851   HGB 12.2 09/03/2018 0851   HCT 37.0 09/03/2018 0851   PLT 283.0 09/03/2018 0851   MCV 84.4 09/03/2018 0851   MCHC 32.9 09/03/2018 0851   RDW 12.7 09/03/2018 0851    Hgb A1C Lab Results  Component Value  Date   HGBA1C 6.7 (H) 09/03/2018            Assessment & Plan:  Right Lower Extremity Edema, HTN:  Takes Amlodipine for HTN- possible source? Encouraged elevation and low fat diet. BP elevated today (rechecked 140/68) but usually well controlled on Losartan and Amlodipine. Will add HCTZ 25 mg PO daily.  DM 2:  Referral placed to endocrinology per pt preference  Encouraged patient to return in 2 weeks for BP recheck and assess swelling.  Nicki Reaperegina Jacqulene Huntley, NP

## 2018-10-01 NOTE — Telephone Encounter (Signed)
Called patient to advise and phone kept ringing. I see now that patient had a visit and discussed this with Rollene Fare already. Disregard my call

## 2018-10-02 ENCOUNTER — Telehealth: Payer: Self-pay

## 2018-10-02 ENCOUNTER — Telehealth: Payer: Self-pay | Admitting: Internal Medicine

## 2018-10-02 NOTE — Telephone Encounter (Signed)
[name] - Client Nonclinical Telephone Record AccessNurse Client [facility] - Client Client Site [place] Physician Webb Silversmith - NP Contact Type Call Who Is Calling Patient / Member / Family / Caregiver Caller Name [place] Phone Number [phone] Call Type Message Only Information Provided Reason for Call Returning a Call from the Office Initial [name] states is returning the call to the office. Please call the caller. Additional Comment Call Closed By: Delton Coombes Transaction Date/Time: 10/01/2018 5:18:15 PM (ET)

## 2018-10-02 NOTE — Telephone Encounter (Signed)
Please advise 

## 2018-10-02 NOTE — Telephone Encounter (Signed)
Best number [phone]  The med you prescribed yesterday caused swelling  In her legs and ankles.  Over night they did come down and they are swelling again today  Please advise what you want pt to do

## 2018-10-02 NOTE — Telephone Encounter (Signed)
Pt aware via detailed message to disregard all calls  October 01, 2018 Tiffany Kocher V   4:52 PM Note Called patient to advise and phone kept ringing. I see now that patient had a visit and discussed this with Rollene Fare already. Disregard my call

## 2018-10-03 ENCOUNTER — Telehealth: Payer: Self-pay

## 2018-10-03 NOTE — Telephone Encounter (Signed)
Pt returned call back. Informed her of the instructions from [facility] NP. Pt verbally repeated it and made 2 Wk F/U appt.

## 2018-10-03 NOTE — Telephone Encounter (Signed)
Pt returned call back. Repeated the directions that [facility] NP instructed to inform Pt. Pt has already made 2 wk F/U appt. Made a telephone encounter

## 2018-10-03 NOTE — Telephone Encounter (Signed)
HCTZ should not cause swelling. It should reduce the swelling. At this point would recommend she hold the Amlodipine x 2 week. Continue HCTZ and Losartan. Follow up with me in 2 weeks.

## 2018-10-03 NOTE — Telephone Encounter (Signed)
Called Pt to advise. LVM to return call back .

## 2018-10-10 ENCOUNTER — Other Ambulatory Visit: Payer: Self-pay | Admitting: Internal Medicine

## 2018-10-11 ENCOUNTER — Other Ambulatory Visit: Payer: Self-pay | Admitting: Internal Medicine

## 2018-10-11 DIAGNOSIS — E119 Type 2 diabetes mellitus without complications: Secondary | ICD-10-CM

## 2018-10-14 ENCOUNTER — Telehealth: Payer: Self-pay | Admitting: Internal Medicine

## 2018-10-14 NOTE — Telephone Encounter (Signed)
Patient called to cancel her 2 week follow up scheduled for tomorrow.  Patient said her blood pressure is in the normal range and her feet aren't swollen.

## 2018-10-14 NOTE — Telephone Encounter (Signed)
Noted.  Can we get some actual BP readings so we can verify, Please?

## 2018-10-15 ENCOUNTER — Ambulatory Visit: Payer: Medicare Other | Admitting: Internal Medicine

## 2018-10-23 ENCOUNTER — Other Ambulatory Visit: Payer: Self-pay | Admitting: Internal Medicine

## 2018-10-25 ENCOUNTER — Other Ambulatory Visit: Payer: Self-pay

## 2018-10-25 ENCOUNTER — Ambulatory Visit: Payer: Medicare Other

## 2018-10-25 NOTE — Telephone Encounter (Signed)
Patient called back with another blood pressure reading  127/63 - 8/7 @ 3:00pm

## 2018-10-25 NOTE — Telephone Encounter (Signed)
Pt will call office with blood pressure readings

## 2018-10-25 NOTE — Telephone Encounter (Signed)
Mel, Anyway to document this? I opened up the phone encounter but did not see a place for vitals.

## 2018-10-25 NOTE — Telephone Encounter (Signed)
PT called back with readings  132/64- 8/7@ noon  Pt said she will cb with more, she thought you just needed 1 reading.

## 2018-10-25 NOTE — Telephone Encounter (Signed)
Pt is calling back later this morning at least 2 hours after taking BP meds with BP reading as she has a machine at home.Marland KitchenMarland Kitchen

## 2018-10-28 NOTE — Telephone Encounter (Signed)
Yes should continue HCTZ

## 2018-10-28 NOTE — Telephone Encounter (Signed)
See phone note  Is pt going to continue medication?... please advise

## 2018-10-30 ENCOUNTER — Other Ambulatory Visit: Payer: Self-pay

## 2018-10-30 ENCOUNTER — Ambulatory Visit: Payer: Medicare Other

## 2018-10-30 VITALS — BP 127/63

## 2018-10-30 DIAGNOSIS — I10 Essential (primary) hypertension: Secondary | ICD-10-CM

## 2018-10-30 NOTE — Progress Notes (Signed)
Pt called in with BP reading of 127/63 per your request

## 2018-11-04 ENCOUNTER — Ambulatory Visit: Payer: Medicare Other | Admitting: Internal Medicine

## 2018-11-04 ENCOUNTER — Telehealth: Payer: Self-pay

## 2018-11-04 DIAGNOSIS — Z0289 Encounter for other administrative examinations: Secondary | ICD-10-CM

## 2018-11-04 MED ORDER — HYDRALAZINE HCL 10 MG PO TABS: 10.0000 mg | ORAL_TABLET | Freq: Two times a day (BID) | ORAL | 0 refills | Status: DC

## 2018-11-04 NOTE — Addendum Note (Signed)
Addended by: Jearld Fenton on: 11/04/2018 10:19 AM   Modules accepted: Orders

## 2018-11-04 NOTE — Telephone Encounter (Signed)
Pt reports BP was 123/71 this morning, but she is experiencing LE edema...  I will schedule a 2 week f/u appt to recheck BP

## 2018-11-04 NOTE — Telephone Encounter (Signed)
Hydralazine sent to pharmacy in place of Amlodipine. Will follow up in 2 weeks.

## 2018-11-06 ENCOUNTER — Telehealth: Payer: Self-pay | Admitting: Internal Medicine

## 2018-11-06 NOTE — Telephone Encounter (Signed)
Pt is aware as instructed to take Hydralazine, HCTZ and Losartan, D/C Amlodipine

## 2018-11-06 NOTE — Telephone Encounter (Signed)
Best number [phone]  Pt would like melanie to call her back she stated she is confused about her new meds

## 2018-11-20 ENCOUNTER — Other Ambulatory Visit: Payer: Self-pay | Admitting: Internal Medicine

## 2018-11-21 ENCOUNTER — Other Ambulatory Visit: Payer: Self-pay | Admitting: Internal Medicine

## 2018-11-28 ENCOUNTER — Other Ambulatory Visit: Payer: Self-pay | Admitting: Internal Medicine

## 2018-12-14 ENCOUNTER — Other Ambulatory Visit: Payer: Self-pay | Admitting: Internal Medicine

## 2018-12-24 ENCOUNTER — Other Ambulatory Visit: Payer: Self-pay | Admitting: Internal Medicine

## 2019-02-18 ENCOUNTER — Ambulatory Visit: Payer: Medicare Other | Admitting: Internal Medicine

## 2019-03-05 ENCOUNTER — Other Ambulatory Visit: Payer: Self-pay | Admitting: Internal Medicine

## 2019-03-09 ENCOUNTER — Other Ambulatory Visit: Payer: Self-pay | Admitting: Internal Medicine

## 2019-03-10 ENCOUNTER — Other Ambulatory Visit: Payer: Self-pay | Admitting: Internal Medicine

## 2019-03-10 DIAGNOSIS — Z1231 Encounter for screening mammogram for malignant neoplasm of breast: Secondary | ICD-10-CM

## 2019-03-11 ENCOUNTER — Other Ambulatory Visit: Payer: Self-pay | Admitting: Internal Medicine

## 2019-03-17 ENCOUNTER — Other Ambulatory Visit: Payer: Self-pay

## 2019-03-17 ENCOUNTER — Ambulatory Visit (INDEPENDENT_AMBULATORY_CARE_PROVIDER_SITE_OTHER): Payer: Medicare Other | Admitting: Internal Medicine

## 2019-03-17 ENCOUNTER — Encounter: Payer: Self-pay | Admitting: Internal Medicine

## 2019-03-17 VITALS — BP 134/76 | HR 73 | Temp 97.4°F | Wt 147.0 lb

## 2019-03-17 DIAGNOSIS — K219 Gastro-esophageal reflux disease without esophagitis: Secondary | ICD-10-CM

## 2019-03-17 DIAGNOSIS — E7800 Pure hypercholesterolemia, unspecified: Secondary | ICD-10-CM

## 2019-03-17 DIAGNOSIS — D509 Iron deficiency anemia, unspecified: Secondary | ICD-10-CM

## 2019-03-17 DIAGNOSIS — E119 Type 2 diabetes mellitus without complications: Secondary | ICD-10-CM | POA: Diagnosis not present

## 2019-03-17 DIAGNOSIS — I10 Essential (primary) hypertension: Secondary | ICD-10-CM | POA: Diagnosis not present

## 2019-03-17 DIAGNOSIS — Z23 Encounter for immunization: Secondary | ICD-10-CM

## 2019-03-17 LAB — POCT GLYCOSYLATED HEMOGLOBIN (HGB A1C): Hemoglobin A1C: 6.5 % — AB (ref 4.0–5.6)

## 2019-03-17 NOTE — Assessment & Plan Note (Signed)
CBC reviewed No intervention at this time 

## 2019-03-17 NOTE — Assessment & Plan Note (Signed)
Continue Omeprazole Try to avoid foods that trigger your reflux

## 2019-03-17 NOTE — Assessment & Plan Note (Signed)
POCT A1C 6.5% Encouraged low carb diet, exercise for weight loss Continue Metformin  Foot exam today Will obtain copy of eye exam

## 2019-03-17 NOTE — Progress Notes (Signed)
Subjective:    Patient ID: Vicki Perry, female    DOB: 1951/02/15, 68 y.o.   MRN: [id number]  HPI  Pt presents to the clinic today for 6 month follow up of chronic conditions.  GERD: Triggered by meat.  She takes Omeprazole 2 times a week without breakthrough.  There is no upper GI on file.  HTN: Her BP today is 134/76.  She is taking Hydralazine, HCTZ, Losartan as prescribed.  ECG from 02/2014 reviewed.  HLD: Her last LDL was 68, 08/2018.  She denies myalgias on Simvastatin.  She tries to consume a low-fat diet.  Anemia: Her last H/H was 12.2/37, 08/2018.  She is not taking an iron supplement OTC.  She does not follow with hematology.  DM2: Her last A1c was 6.7, 08/2018.  She is taking Metformin as prescribed. She does not check her sugars daily but about 3 times per week. She checks her feet routinely. Her last eye exam was within the last month.  Review of Systems      Past Medical History:  Diagnosis Date  . Anemia   . Diabetes mellitus   . High cholesterol   . Hypertension     Current Outpatient Medications  Medication Sig Dispense Refill  . aspirin EC 81 MG tablet Take 81 mg by mouth daily.    . calcium gluconate 500 MG tablet Take 1 tablet by mouth daily.    . cholecalciferol (VITAMIN D3) 25 MCG (1000 UT) tablet Take 1,000 Units by mouth daily.    Marland Kitchen glucose blood (ONETOUCH VERIO) test strip 1 each by Other route 2 (two) times a day. 200 each 2  . hydrALAZINE (APRESOLINE) 10 MG tablet TAKE 1 TABLET BY MOUTH TWICE A DAY 60 tablet 2  . hydrochlorothiazide (HYDRODIURIL) 25 MG tablet TAKE 1 TABLET BY MOUTH EVERY DAY 90 tablet 1  . Lancets (ONETOUCH ULTRASOFT) lancets Use as directed to test blood sugar once daily E11.9 100 each 12  . losartan (COZAAR) 100 MG tablet TAKE 1 TABLET BY MOUTH EVERY DAY 90 tablet 1  . metFORMIN (GLUCOPHAGE) 500 MG tablet TAKE 1 TABLET BY MOUTH EVERY DAY WITH BREAKFAST 90 tablet 0  . omeprazole (PRILOSEC) 20 MG capsule Take 1 capsule (20 mg  total) by mouth daily. (Patient taking differently: Take 20 mg by mouth daily as needed (for heartburn symptoms). ) 90 capsule 1  . simvastatin (ZOCOR) 40 MG tablet TAKE 1 TABLET (40 MG TOTAL) BY MOUTH DAILY AT 6 PM. 90 tablet 0   No current facility-administered medications for this visit.    Allergies  Allergen Reactions  . Codeine Nausea Only    Family History  Problem Relation Age of Onset  . Diabetes Mother   . Hypertension Mother   . Cancer Mother 42       Cervical  . Hypertension Father   . Diabetes Maternal Grandmother   . Hypertension Sister   . Diabetes Sister   . Hypertension Brother   . Heart disease Neg Hx   . Stroke Neg Hx     Social History   Socioeconomic History  . Marital status: Single    Spouse name: Not on file  . Number of children: Not on file  . Years of education: Not on file  . Highest education level: Not on file  Occupational History  . Not on file  Tobacco Use  . Smoking status: Never Smoker  . Smokeless tobacco: Never Used  Substance and Sexual Activity  .  Alcohol use: No  . Drug use: No  . Sexual activity: Not on file  Other Topics Concern  . Not on file  Social History Narrative  . Not on file   Social Determinants of Health   Financial Resource Strain:   . Difficulty of Paying Living Expenses: Not on file  Food Insecurity:   . Worried About Programme researcher, broadcasting/film/video in the Last Year: Not on file  . Ran Out of Food in the Last Year: Not on file  Transportation Needs:   . Lack of Transportation (Medical): Not on file  . Lack of Transportation (Non-Medical): Not on file  Physical Activity:   . Days of Exercise per Week: Not on file  . Minutes of Exercise per Session: Not on file  Stress:   . Feeling of Stress : Not on file  Social Connections:   . Frequency of Communication with Friends and Family: Not on file  . Frequency of Social Gatherings with Friends and Family: Not on file  . Attends Religious Services: Not on file  .  Active Member of Clubs or Organizations: Not on file  . Attends Banker Meetings: Not on file  . Marital Status: Not on file  Intimate Partner Violence:   . Fear of Current or Ex-Partner: Not on file  . Emotionally Abused: Not on file  . Physically Abused: Not on file  . Sexually Abused: Not on file     Constitutional: Denies fever, malaise, fatigue, headache or abrupt weight changes.  HEENT: Denies eye pain, eye redness, ear pain, ringing in the ears, wax buildup, runny nose, nasal congestion, bloody nose, or sore throat. Respiratory: Denies difficulty breathing, shortness of breath, cough or sputum production.   Cardiovascular: Denies chest pain, chest tightness, palpitations or swelling in the hands or feet.  Gastrointestinal: Denies abdominal pain, bloating, constipation, diarrhea or blood in the stool.  GU: Denies urgency, frequency, pain with urination, burning sensation, blood in urine, odor or discharge. Musculoskeletal: Denies decrease in range of motion, difficulty with gait, muscle pain or joint pain and swelling.  Skin: Denies redness, rashes, lesions or ulcercations.  Neurological: Denies dizziness, difficulty with memory, difficulty with speech or problems with balance and coordination.  Psych: Denies anxiety, depression, SI/HI.  No other specific complaints in a complete review of systems (except as listed in HPI above).  Objective:   Physical Exam  BP 134/76   Pulse 73   Temp (!) 97.4 F (36.3 C) (Temporal)   Wt 147 lb (66.7 kg)   SpO2 98%   BMI 28.24 kg/m   Wt Readings from Last 3 Encounters:  10/01/18 149 lb (67.6 kg)  09/03/18 146 lb (66.2 kg)  02/11/18 141 lb (64 kg)    General: Appears her stated age, well developed, well nourished in NAD. Skin: Warm, dry and intact. No ulcerations reported. Cardiovascular: Normal rate and rhythm. S1,S2 noted.  No murmur, rubs or gallops noted. No JVD or BLE edema. No carotid bruits  noted. Pulmonary/Chest: Normal effort and positive vesicular breath sounds. No respiratory distress. No wheezes, rales or ronchi noted.  Abdomen: Soft. Normal bowel sounds.  Musculoskeletal: No difficulty with gait.  Neurological: Alert and oriented.  Psychiatric: Mood and affect normal. Behavior is normal. Judgment and thought content normal.     BMET    Component Value Date/Time   NA 139 09/03/2018 0851   K 4.8 09/03/2018 0851   CL 104 09/03/2018 0851   CO2 28 09/03/2018 0851  GLUCOSE 110 (H) 09/03/2018 0851   BUN 13 09/03/2018 0851   CREATININE 1.01 09/03/2018 0851   CALCIUM 9.2 09/03/2018 0851    Lipid Panel     Component Value Date/Time   CHOL 137 09/03/2018 0851   TRIG 99.0 09/03/2018 0851   HDL 49.30 09/03/2018 0851   CHOLHDL 3 09/03/2018 0851   VLDL 19.8 09/03/2018 0851   LDLCALC 68 09/03/2018 0851    CBC    Component Value Date/Time   WBC 7.4 09/03/2018 0851   RBC 4.39 09/03/2018 0851   HGB 12.2 09/03/2018 0851   HCT 37.0 09/03/2018 0851   PLT 283.0 09/03/2018 0851   MCV 84.4 09/03/2018 0851   MCHC 32.9 09/03/2018 0851   RDW 12.7 09/03/2018 0851    Hgb A1C Lab Results  Component Value Date   HGBA1C 6.7 (H) 09/03/2018           Assessment & Plan:

## 2019-03-17 NOTE — Assessment & Plan Note (Signed)
CMET and Lipid profile reviewed Encouraged her to consume a low fat diet Continue Simvastatin for now

## 2019-03-17 NOTE — Patient Instructions (Signed)
Fat and Cholesterol Restricted Eating Plan Getting too much fat and cholesterol in your diet may cause health problems. Choosing the right foods helps keep your fat and cholesterol at normal levels. This can keep you from getting certain diseases. Your doctor may recommend an eating plan that includes:  Total fat: ______% or less of total calories a day.  Saturated fat: ______% or less of total calories a day.  Cholesterol: less than _________mg a day.  Fiber: ______g a day. What are tips for following this plan? Meal planning  At meals, divide your plate into four equal parts: ? Fill one-half of your plate with vegetables and green salads. ? Fill one-fourth of your plate with whole grains. ? Fill one-fourth of your plate with low-fat (lean) protein foods.  Eat fish that is high in omega-3 fats at least two times a week. This includes mackerel, tuna, sardines, and salmon.  Eat foods that are high in fiber, such as whole grains, beans, apples, broccoli, carrots, peas, and barley. General tips   Work with your doctor to lose weight if you need to.  Avoid: ? Foods with added sugar. ? Fried foods. ? Foods with partially hydrogenated oils.  Limit alcohol intake to no more than 1 drink a day for nonpregnant women and 2 drinks a day for men. One drink equals 12 oz of beer, 5 oz of wine, or 1 oz of hard liquor. Reading food labels  Check food labels for: ? Trans fats. ? Partially hydrogenated oils. ? Saturated fat (g) in each serving. ? Cholesterol (mg) in each serving. ? Fiber (g) in each serving.  Choose foods with healthy fats, such as: ? Monounsaturated fats. ? Polyunsaturated fats. ? Omega-3 fats.  Choose grain products that have whole grains. Look for the word "whole" as the first word in the ingredient list. Cooking  Cook foods using low-fat methods. These include baking, boiling, grilling, and broiling.  Eat more home-cooked foods. Eat at restaurants and buffets  less often.  Avoid cooking using saturated fats, such as butter, cream, palm oil, palm kernel oil, and coconut oil. Recommended foods  Fruits  All fresh, canned (in natural juice), or frozen fruits. Vegetables  Fresh or frozen vegetables (raw, steamed, roasted, or grilled). Green salads. Grains  Whole grains, such as whole wheat or whole grain breads, crackers, cereals, and pasta. Unsweetened oatmeal, bulgur, barley, quinoa, or brown rice. Corn or whole wheat flour tortillas. Meats and other protein foods  Ground beef (85% or leaner), grass-fed beef, or beef trimmed of fat. Skinless chicken or turkey. Ground chicken or turkey. Pork trimmed of fat. All fish and seafood. Egg whites. Dried beans, peas, or lentils. Unsalted nuts or seeds. Unsalted canned beans. Nut butters without added sugar or oil. Dairy  Low-fat or nonfat dairy products, such as skim or 1% milk, 2% or reduced-fat cheeses, low-fat and fat-free ricotta or cottage cheese, or plain low-fat and nonfat yogurt. Fats and oils  Tub margarine without trans fats. Light or reduced-fat mayonnaise and salad dressings. Avocado. Olive, canola, sesame, or safflower oils. The items listed above may not be a complete list of foods and beverages you can eat. Contact a dietitian for more information. Foods to avoid Fruits  Canned fruit in heavy syrup. Fruit in cream or butter sauce. Fried fruit. Vegetables  Vegetables cooked in cheese, cream, or butter sauce. Fried vegetables. Grains  White bread. White pasta. White rice. Cornbread. Bagels, pastries, and croissants. Crackers and snack foods that contain trans fat   and hydrogenated oils. Meats and other protein foods  Fatty cuts of meat. Ribs, chicken wings, bacon, sausage, bologna, salami, chitterlings, fatback, hot dogs, bratwurst, and packaged lunch meats. Liver and organ meats. Whole eggs and egg yolks. Chicken and turkey with skin. Fried meat. Dairy  Whole or 2% milk, cream,  half-and-half, and cream cheese. Whole milk cheeses. Whole-fat or sweetened yogurt. Full-fat cheeses. Nondairy creamers and whipped toppings. Processed cheese, cheese spreads, and cheese curds. Beverages  Alcohol. Sugar-sweetened drinks such as sodas, lemonade, and fruit drinks. Fats and oils  Butter, stick margarine, lard, shortening, ghee, or bacon fat. Coconut, palm kernel, and palm oils. Sweets and desserts  Corn syrup, sugars, honey, and molasses. Candy. Jam and jelly. Syrup. Sweetened cereals. Cookies, pies, cakes, donuts, muffins, and ice cream. The items listed above may not be a complete list of foods and beverages you should avoid. Contact a dietitian for more information. Summary  Choosing the right foods helps keep your fat and cholesterol at normal levels. This can keep you from getting certain diseases.  At meals, fill one-half of your plate with vegetables and green salads.  Eat high-fiber foods, like whole grains, beans, apples, carrots, peas, and barley.  Limit added sugar, saturated fats, alcohol, and fried foods. This information is not intended to replace advice given to you by your health care provider. Make sure you discuss any questions you have with your health care provider. Document Released: 09/05/2011 Document Revised: 11/07/2017 Document Reviewed: 11/21/2016 Elsevier Patient Education  2020 [employer]  

## 2019-03-17 NOTE — Assessment & Plan Note (Signed)
Controlled on Losartan, HCTZ and Hydralazine Reinforced DASH diet Kidney function reviewed- normal

## 2019-04-10 ENCOUNTER — Other Ambulatory Visit: Payer: Self-pay

## 2019-04-10 ENCOUNTER — Ambulatory Visit
Admission: RE | Admit: 2019-04-10 | Discharge: 2019-04-10 | Disposition: A | Discharge status: Home / Self Care | Payer: Medicare Other | Source: Ambulatory Visit | Attending: Internal Medicine | Admitting: Internal Medicine

## 2019-04-10 DIAGNOSIS — Z1231 Encounter for screening mammogram for malignant neoplasm of breast: Secondary | ICD-10-CM

## 2019-04-18 ENCOUNTER — Other Ambulatory Visit: Payer: Self-pay | Admitting: Internal Medicine

## 2019-04-18 NOTE — Telephone Encounter (Signed)
Last filled 12/25/2018 #60 with 2 refills... please advise if okay to refill for 90 day supply

## 2019-05-27 ENCOUNTER — Telehealth: Payer: Self-pay | Admitting: *Deleted

## 2019-05-27 NOTE — Telephone Encounter (Signed)
Patient called stating that she is concerned because her blood pressure has been fluctuating.Patient stated that her blood pressure readings were  Saturday was 176/77 HR 78   Sunday 144/69 HR 64 and 134/64,  Monday 159/67 HR 67, 167/63 HR 60 Today 141/69 HR 83 and she had some vinegar and water and one hour later 131/64HR 74 Patient stated that she has not missed any of her medications. Patient stated that she has had some leg cramps, a little light headed this morning and some pain in her left arm for just a few minutes today.  Patient stated that she has not had a good nights sleep since Friday night. After speaking to Nicki Reaper NP patient was scheduled for an appointment tomorrow at 1:30. Patient was given ER precautions and she verbalized understanding.

## 2019-05-28 ENCOUNTER — Other Ambulatory Visit: Payer: Self-pay

## 2019-05-28 ENCOUNTER — Ambulatory Visit (INDEPENDENT_AMBULATORY_CARE_PROVIDER_SITE_OTHER): Payer: Medicare PPO | Admitting: Internal Medicine

## 2019-05-28 ENCOUNTER — Encounter: Payer: Self-pay | Admitting: Internal Medicine

## 2019-05-28 VITALS — BP 132/70 | HR 71 | Temp 98.0°F | Wt 150.0 lb

## 2019-05-28 DIAGNOSIS — I10 Essential (primary) hypertension: Secondary | ICD-10-CM

## 2019-05-28 DIAGNOSIS — R42 Dizziness and giddiness: Secondary | ICD-10-CM

## 2019-05-28 DIAGNOSIS — M79602 Pain in left arm: Secondary | ICD-10-CM

## 2019-05-28 DIAGNOSIS — R5383 Other fatigue: Secondary | ICD-10-CM

## 2019-05-28 DIAGNOSIS — R519 Headache, unspecified: Secondary | ICD-10-CM | POA: Diagnosis not present

## 2019-05-28 NOTE — Patient Instructions (Signed)

## 2019-05-28 NOTE — Telephone Encounter (Signed)
Will discuss at upcoming appt.

## 2019-05-28 NOTE — Progress Notes (Signed)
Subjective:    Patient ID: Vicki Perry, female    DOB: May 31, 1950, 69 y.o.   MRN: [id number]  HPI  Pt presents to the clinic today with c/o elevated blood pressure, headaches, fatigue, dizziness, left arm pain and tingling, muscle cramps in legs. She reports this started 2 weeks ago. The headache is located in her sinuses, she describes the pain as pressure. She describes the dizziness is more a sense of imbalance, not that the room is spinning. She denies visual changes, sensitivity to light or sound. She reports an intermittent cough (chronic due to BP meds). She denies runny nose, nasal congestion, ear pain, sore throat, loss of taste/smell or SOB. She reports intermittent indigestion but denies chest pain or chest tightness.  She reports her readings on Monday were 159/67, 167/63, 141/69.  Her BP today is 132/70. She is taking Hydralazine, HCTZ, and Losartan as prescribed. She denies increase in stress or anxiety.   Review of Systems      Past Medical History:  Diagnosis Date  . Anemia   . Diabetes mellitus   . High cholesterol   . Hypertension     Current Outpatient Medications  Medication Sig Dispense Refill  . aspirin EC 81 MG tablet Take 81 mg by mouth daily.    . calcium gluconate 500 MG tablet Take 1 tablet by mouth daily.    . cholecalciferol (VITAMIN D3) 25 MCG (1000 UT) tablet Take 1,000 Units by mouth daily.    Marland Kitchen glucose blood (ONETOUCH VERIO) test strip 1 each by Other route 2 (two) times a day. 200 each 2  . hydrALAZINE (APRESOLINE) 10 MG tablet TAKE 1 TABLET BY MOUTH TWICE A DAY 180 tablet 2  . hydrochlorothiazide (HYDRODIURIL) 25 MG tablet TAKE 1 TABLET BY MOUTH EVERY DAY 90 tablet 1  . Lancets (ONETOUCH ULTRASOFT) lancets Use as directed to test blood sugar once daily E11.9 100 each 12  . losartan (COZAAR) 100 MG tablet TAKE 1 TABLET BY MOUTH EVERY DAY 90 tablet 1  . metFORMIN (GLUCOPHAGE) 500 MG tablet TAKE 1 TABLET BY MOUTH EVERY DAY WITH BREAKFAST 90 tablet 0   . omeprazole (PRILOSEC) 20 MG capsule Take 1 capsule (20 mg total) by mouth daily. (Patient taking differently: Take 20 mg by mouth daily as needed (for heartburn symptoms). ) 90 capsule 1  . simvastatin (ZOCOR) 40 MG tablet TAKE 1 TABLET (40 MG TOTAL) BY MOUTH DAILY AT 6 PM. 90 tablet 0   No current facility-administered medications for this visit.    Allergies  Allergen Reactions  . Codeine Nausea Only    Family History  Problem Relation Age of Onset  . Diabetes Mother   . Hypertension Mother   . Cancer Mother 39       Cervical  . Hypertension Father   . Diabetes Maternal Grandmother   . Hypertension Sister   . Diabetes Sister   . Hypertension Brother   . Heart disease Neg Hx   . Stroke Neg Hx     Social History   Socioeconomic History  . Marital status: Single    Spouse name: Not on file  . Number of children: Not on file  . Years of education: Not on file  . Highest education level: Not on file  Occupational History  . Not on file  Tobacco Use  . Smoking status: Never Smoker  . Smokeless tobacco: Never Used  Substance and Sexual Activity  . Alcohol use: No  . Drug use: No  .  Sexual activity: Not on file  Other Topics Concern  . Not on file  Social History Narrative  . Not on file   Social Determinants of Health   Financial Resource Strain:   . Difficulty of Paying Living Expenses: Not on file  Food Insecurity:   . Worried About Charity fundraiser in the Last Year: Not on file  . Ran Out of Food in the Last Year: Not on file  Transportation Needs:   . Lack of Transportation (Medical): Not on file  . Lack of Transportation (Non-Medical): Not on file  Physical Activity:   . Days of Exercise per Week: Not on file  . Minutes of Exercise per Session: Not on file  Stress:   . Feeling of Stress : Not on file  Social Connections:   . Frequency of Communication with Friends and Family: Not on file  . Frequency of Social Gatherings with Friends and Family:  Not on file  . Attends Religious Services: Not on file  . Active Member of Clubs or Organizations: Not on file  . Attends Archivist Meetings: Not on file  . Marital Status: Not on file  Intimate Partner Violence:   . Fear of Current or Ex-Partner: Not on file  . Emotionally Abused: Not on file  . Physically Abused: Not on file  . Sexually Abused: Not on file     Constitutional: Pt reports fatigue, headaches. Denies fever, malaise, or abrupt weight changes.  HEENT: Denies eye pain, eye redness, ear pain, ringing in the ears, wax buildup, runny nose, nasal congestion, bloody nose, or sore throat. Respiratory: Pt reports cough.  Denies difficulty breathing, shortness of breath, cough or sputum production.   Cardiovascular: Denies chest pain, chest tightness, palpitations or swelling in the hands or feet.  Gastrointestinal: Pt reports intermittent reflux. Denies abdominal pain, bloating, constipation, diarrhea or blood in the stool.  GU: Denies urgency, frequency, pain with urination, burning sensation, blood in urine, odor or discharge. Musculoskeletal: Pt reports intermittent pain in left arm. Denies decrease in range of motion, difficulty with gait, muscle pain or joint pain and swelling.  Skin: Denies redness, rashes, lesions or ulcercations.  Neurological: Pt reports tingling in left arm, dizziness. Denies difficulty with memory, difficulty with speech, numbness or problems with balance and coordination.  Psych: Pt denies anxiety, depression.  No other specific complaints in a complete review of systems (except as listed in HPI above).  Objective:   Physical Exam  BP 132/70   Pulse 71   Temp 98 F (36.7 C) (Temporal)   Wt 150 lb (68 kg)   SpO2 98%   BMI 28.81 kg/m   Wt Readings from Last 3 Encounters:  03/17/19 147 lb (66.7 kg)  10/01/18 149 lb (67.6 kg)  09/03/18 146 lb (66.2 kg)    General: Appears her stated age, well developed, well nourished in NAD. Skin:  Warm, dry and intact.  HEENT: Head: normal shape and size; Eyes: sclera white, no icterus, conjunctiva pink, PERRLA and EOMs intact;  Cardiovascular: Normal rate and rhythm. S1,S2 noted.  Murmur noted. No JVD or BLE edema. No carotid bruits noted. Pulmonary/Chest: Normal effort and positive vesicular breath sounds. No respiratory distress. No wheezes, rales or ronchi noted.  Abdomen: Soft and nontender. Normal bowel sounds. No distention or masses noted.  Musculoskeletal: Normal internal and external rotation of the left shoulder. Normal flexion, extension and rotation of the left elbow. No joint pain with palpaiton or joint swelling noted. Strength  5/5 BUE/BLE. No difficulty with gait.  Neurological: Alert and oriented. Cranial nerves II-XII grossly intact. Coordination normal.  Psychiatric: Mood and affect normal. Behavior is normal. Judgment and thought content normal.    BMET    Component Value Date/Time   NA 139 09/03/2018 0851   K 4.8 09/03/2018 0851   CL 104 09/03/2018 0851   CO2 28 09/03/2018 0851   GLUCOSE 110 (H) 09/03/2018 0851   BUN 13 09/03/2018 0851   CREATININE 1.01 09/03/2018 0851   CALCIUM 9.2 09/03/2018 0851    Lipid Panel     Component Value Date/Time   CHOL 137 09/03/2018 0851   TRIG 99.0 09/03/2018 0851   HDL 49.30 09/03/2018 0851   CHOLHDL 3 09/03/2018 0851   VLDL 19.8 09/03/2018 0851   LDLCALC 68 09/03/2018 0851    CBC    Component Value Date/Time   WBC 7.4 09/03/2018 0851   RBC 4.39 09/03/2018 0851   HGB 12.2 09/03/2018 0851   HCT 37.0 09/03/2018 0851   PLT 283.0 09/03/2018 0851   MCV 84.4 09/03/2018 0851   MCHC 32.9 09/03/2018 0851   RDW 12.7 09/03/2018 0851    Hgb A1C Lab Results  Component Value Date   HGBA1C 6.5 (A) 03/17/2019            Assessment & Plan:  Fatigue, Headaches, Lightheadedness, Left Arm Pain, Left Arm Tingling, HTN:  Could be r/t anxiety Exam benign Indication for ECG: HTN, Left Arm Pain Interpretation of  ECG: normal rate and rhythm, no acute findings Comparison of ECG: 02/2014- unchanged Labs from 08/2018 reviewed, no indication for additional labs Continue current meds Reinforced DASH diet, increase in aerobic exercise She would like referral to cardiology to discus stress test given her family history- referral placed  Return/ER precautions discussed Nicki Reaper, NP This visit occurred during the SARS-CoV-2 public health emergency.  Safety protocols were in place, including screening questions prior to the visit, additional usage of staff PPE, and extensive cleaning of exam room while observing appropriate contact time as indicated for disinfecting solutions.

## 2019-06-02 ENCOUNTER — Other Ambulatory Visit: Payer: Self-pay | Admitting: Internal Medicine

## 2019-06-09 NOTE — Progress Notes (Signed)
Cardiology Office Note:    Date:  06/19/2019   ID:  Vicki Perry, DOB 1950/06/12, MRN [id number]  PCP:  Lorre Munroe, NP  Cardiologist:  No primary care provider on file.  Electrophysiologist:  None   Referring MD: Lorre Munroe, NP   Chief Complaint  Patient presents with  . Chest Pain    History of Present Illness:    Vicki Perry is a 69 y.o. female with a hx of hypertension, hyperlipidemia, diabetes who is referred by Nicki Reaper, NP for evaluation of hypertension and chest pain.  Reports has been having left-sided chest pain.  Describes as dull aching pain.  Has occurred 3-4 times in the last few weeks.  Can occur with exertion and last for up to an hour.  Was walking 3 miles per day, but has not been walking since November.  Denies any chest pain when she was walking in November.  States that she has gained 10 pounds since she stopped working.  Also periodically has left arm pain.  Checks BP at home, has been up to 170s 2 weeks ago but recently has been in the 130s.  No smoking history.  Brother had CVA.   Past Medical History:  Diagnosis Date  . Anemia   . Diabetes mellitus   . High cholesterol   . Hypertension     Past Surgical History:  Procedure Laterality Date  . CATARACT EXTRACTION, BILATERAL Bilateral 03/22/2018  . CESAREAN SECTION    . FOOT SURGERY Right    Bone Spur removal  . TONSILLECTOMY    . WISDOM TOOTH EXTRACTION      Current Medications: Current Meds  Medication Sig  . aspirin EC 81 MG tablet Take 81 mg by mouth daily.  . calcium gluconate 500 MG tablet Take 1 tablet by mouth daily.  . cholecalciferol (VITAMIN D3) 25 MCG (1000 UT) tablet Take 1,000 Units by mouth daily.  Marland Kitchen glucose blood (ONETOUCH VERIO) test strip 1 each by Other route 2 (two) times a day.  . hydrALAZINE (APRESOLINE) 10 MG tablet TAKE 1 TABLET BY MOUTH TWICE A DAY  . hydrochlorothiazide (HYDRODIURIL) 25 MG tablet TAKE 1 TABLET BY MOUTH EVERY DAY  . Lancets (ONETOUCH  ULTRASOFT) lancets Use as directed to test blood sugar once daily E11.9  . losartan (COZAAR) 100 MG tablet TAKE 1 TABLET BY MOUTH EVERY DAY  . metFORMIN (GLUCOPHAGE) 500 MG tablet TAKE 1 TABLET BY MOUTH EVERY DAY WITH BREAKFAST  . omeprazole (PRILOSEC) 20 MG capsule Take 1 capsule (20 mg total) by mouth daily. (Patient taking differently: Take 20 mg by mouth daily as needed (for heartburn symptoms). )  . simvastatin (ZOCOR) 40 MG tablet TAKE 1 TABLET BY MOUTH DAILY AT 6 PM.     Allergies:   Codeine   Social History   Socioeconomic History  . Marital status: Single    Spouse name: Not on file  . Number of children: Not on file  . Years of education: Not on file  . Highest education level: Not on file  Occupational History  . Not on file  Tobacco Use  . Smoking status: Never Smoker  . Smokeless tobacco: Never Used  Substance and Sexual Activity  . Alcohol use: No  . Drug use: No  . Sexual activity: Not on file  Other Topics Concern  . Not on file  Social History Narrative  . Not on file   Social Determinants of Health   Financial Resource Strain:   .  Difficulty of Paying Living Expenses:   Food Insecurity:   . Worried About Charity fundraiser in the Last Year:   . Arboriculturist in the Last Year:   Transportation Needs:   . Film/video editor (Medical):   Marland Kitchen Lack of Transportation (Non-Medical):   Physical Activity:   . Days of Exercise per Week:   . Minutes of Exercise per Session:   Stress:   . Feeling of Stress :   Social Connections:   . Frequency of Communication with Friends and Family:   . Frequency of Social Gatherings with Friends and Family:   . Attends Religious Services:   . Active Member of Clubs or Organizations:   . Attends Archivist Meetings:   Marland Kitchen Marital Status:      Family History: The patient's family history includes Cancer (age of onset: 43) in her mother; Diabetes in her maternal grandmother, mother, and sister; Hypertension  in her brother, father, mother, and sister. There is no history of Heart disease or Stroke.  ROS:   Please see the history of present illness.     All other systems reviewed and are negative.  EKGs/Labs/Other Studies Reviewed:    The following studies were reviewed today:   EKG:  EKG is  ordered today.  The ekg ordered today demonstrates normal sinus rhythm, rate 65, no ST/T abnormalities  Recent Labs: 09/03/2018: ALT 13; Hemoglobin 12.2; Platelets 283.0; TSH 1.63 06/13/2019: BUN 14; Creatinine, Ser 1.07; Potassium 4.8; Sodium 131  Recent Lipid Panel    Component Value Date/Time   CHOL 137 09/03/2018 0851   TRIG 99.0 09/03/2018 0851   HDL 49.30 09/03/2018 0851   CHOLHDL 3 09/03/2018 0851   VLDL 19.8 09/03/2018 0851   LDLCALC 68 09/03/2018 0851    Physical Exam:    VS:  BP 122/80   Pulse 65   Temp (!) 97.2 F (36.2 C)   Ht 5\' 1"  (1.549 m)   Wt 150 lb (68 kg)   SpO2 99%   BMI 28.34 kg/m     Wt Readings from Last 3 Encounters:  06/13/19 150 lb (68 kg)  05/28/19 150 lb (68 kg)  03/17/19 147 lb (66.7 kg)     GEN:  Well nourished, well developed in no acute distress HEENT: Normal NECK: No JVD CARDIAC: RRR, no murmurs, rubs, gallops RESPIRATORY:  Clear to auscultation without rales, wheezing or rhonchi  ABDOMEN: Soft, non-tender, non-distended MUSCULOSKELETAL:  No edema; No deformity  SKIN: Warm and dry NEUROLOGIC:  Alert and oriented x 3 PSYCHIATRIC:  Normal affect   ASSESSMENT:    1. Chest pain, unspecified type   2. Essential hypertension   3. Hyperlipidemia, unspecified hyperlipidemia type   4. Pre-procedure lab exam    PLAN:    In order of problems listed above:  Chest pain: Atypical in description, though can occur with exertion and patient does have multiple CAD risk factors (age, hypertension, hyperlipidemia, diabetes).  Overall would classify as intermediate risk of obstructive coronary disease -Coronary CTA -TTE  Hypertension: On  hydrochlorothiazide 25 mg daily, hydralazine 10 mg twice daily, losartan 100 mg daily  Hyperlipidemia: On simvastatin 40 mg daily.  LDL 68 on 09/03/2018  Type 2 diabetes: A1c 6.5 on 03/17/2019.  On Metformin  RTC in 3 months  Medication Adjustments/Labs and Tests Ordered: Current medicines are reviewed at length with the patient today.  Concerns regarding medicines are outlined above.  Orders Placed This Encounter  Procedures  . CT  CORONARY MORPH W/CTA COR W/SCORE W/CA W/CM &/OR WO/CM  . CT CORONARY FRACTIONAL FLOW RESERVE DATA PREP  . CT CORONARY FRACTIONAL FLOW RESERVE FLUID ANALYSIS  . Basic metabolic panel  . EKG 12-Lead  . ECHOCARDIOGRAM COMPLETE   Meds ordered this encounter  Medications  . metoprolol tartrate (LOPRESSOR) 50 MG tablet    Sig: Take 50 mg (1 tablet) TWO hours prior to CT    Dispense:  1 tablet    Refill:  0    Patient Instructions  Medication Instructions:  Your physician recommends that you continue on your current medications as directed. Please refer to the Current Medication list given to you today.  *If you need a refill on your cardiac medications before your next appointment, please call your pharmacy*   Lab Work: BMET today  If you have labs (blood work) drawn today and your tests are completely normal, you will receive your results only by: Marland Kitchen MyChart Message (if you have MyChart) OR . A paper copy in the mail If you have any lab test that is abnormal or we need to change your treatment, we will call you to review the results.   Testing/Procedures: Your physician has requested that you have cardiac CT. Cardiac computed tomography (CT) is a painless test that uses an x-ray machine to take clear, detailed pictures of your heart. For further information please visit [email or web address] Please follow instruction sheet as given.  Your physician has requested that you have an echocardiogram. Echocardiography is a painless test that uses sound waves  to create images of your heart. It provides your doctor with information about the size and shape of your heart and how well your heart's chambers and valves are working. This procedure takes approximately one hour. There are no restrictions for this procedure. This will be done at our [facility] location:  [address]   Follow-Up: At [facility], you and your health needs are our priority.  As part of our continuing mission to provide you with exceptional heart care, we have created designated Provider Care Teams.  These Care Teams include your primary Cardiologist (physician) and Advanced Practice Providers (APPs -  Physician Assistants and Nurse Practitioners) who all work together to provide you with the care you need, when you need it.  We recommend signing up for the patient portal called "MyChart".  Sign up information is provided on this After Visit Summary.  MyChart is used to connect with patients for Virtual Visits (Telemedicine).  Patients are able to view lab/test results, encounter notes, upcoming appointments, etc.  Non-urgent messages can be sent to your provider as well.   To learn more about what you can do with MyChart, go to [email or web address].    Your next appointment:   3 month(s)  The format for your next appointment:   In Person  Provider:   Epifanio Lesches, MD       Signed, [name], MD  06/19/2019 10:19 PM    [facility]

## 2019-06-11 IMAGING — CT CT MAXILLOFACIAL W/O CM
4 of 6 series · 17 of 47 positions shown, 19 images · non-contrast
Comparison: Head CT scan 03/06/2008.

CLINICAL DATA: The patient suffered a trip and fall with a blow to
the face 8 days ago. Headaches and vision changes since the
incident. Initial encounter.

EXAM:
CT HEAD WITHOUT CONTRAST
CT MAXILLOFACIAL WITHOUT CONTRAST
TECHNIQUE: Multidetector CT imaging of the head and maxillofacial structures
were performed using the standard protocol without intravenous
contrast. Multiplanar CT image reconstructions of the maxillofacial
structures were also generated.

[Series 2: head wo · axial · 0.47mm/px · z∈[-109,-9]mm · 6 of 29 slices shown, 8 images]
[im 5/29  brain]
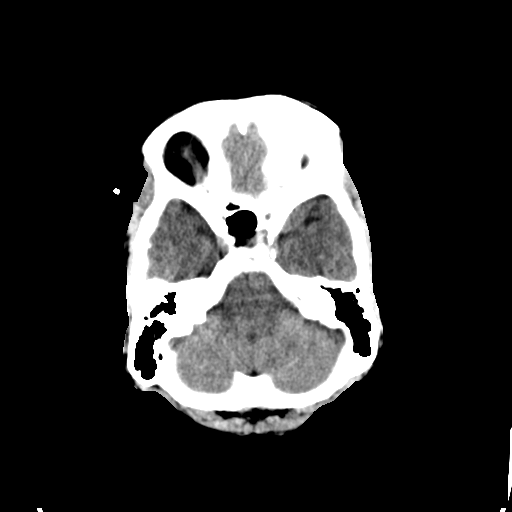
[im 5/29  bone]
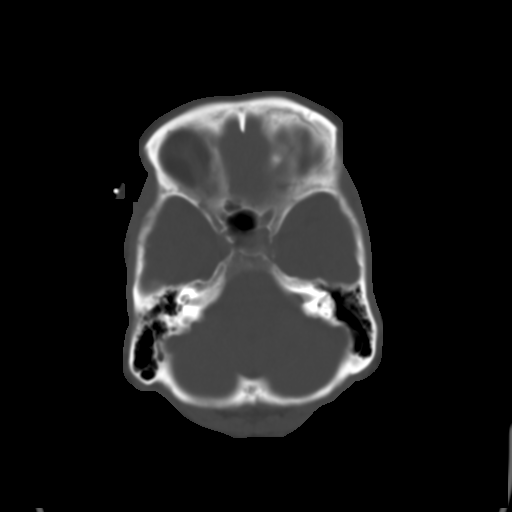
[im 9/29  bone]
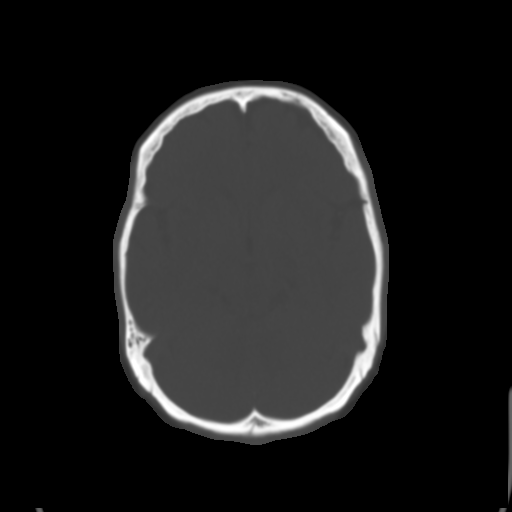
[im 13/29  bone]
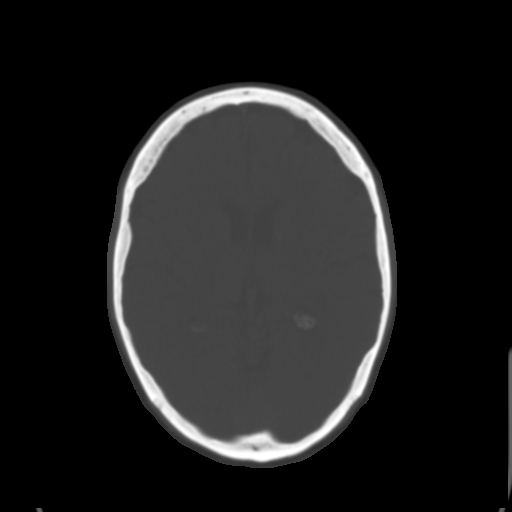
[im 17/29  bone]
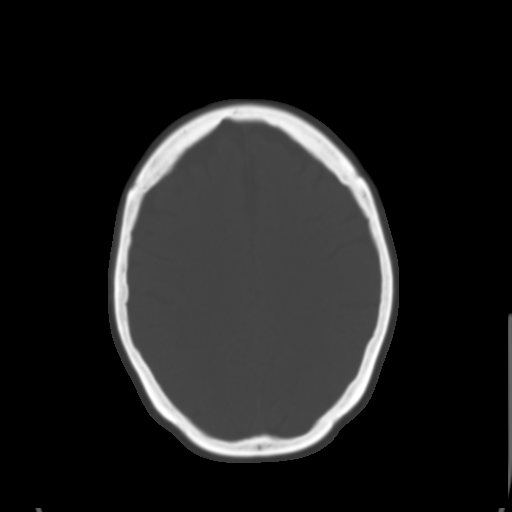
[im 21/29  brain]
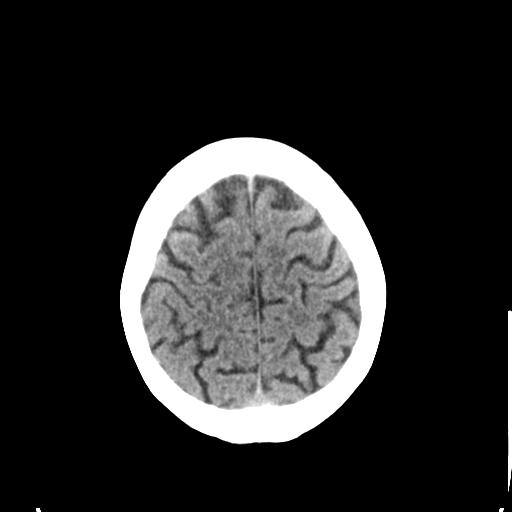
[im 21/29  bone]
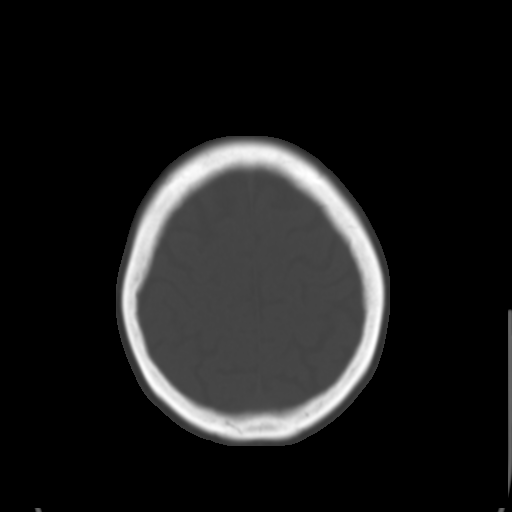
[im 25/29  bone]
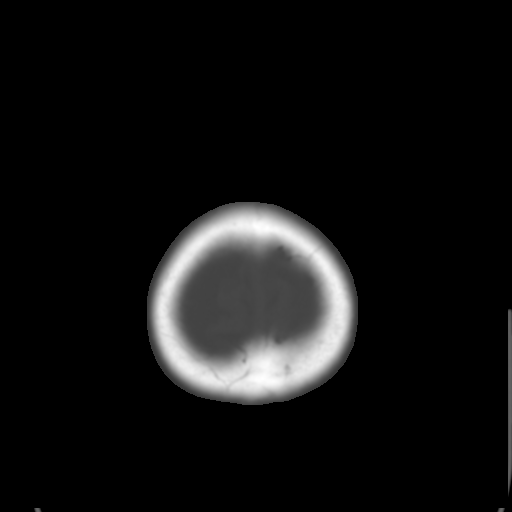

[Series 6: max soft · axial · 0.33mm/px · z∈[-232,-140]mm · 6 of 81 slices shown]
[im 8/81  brain]
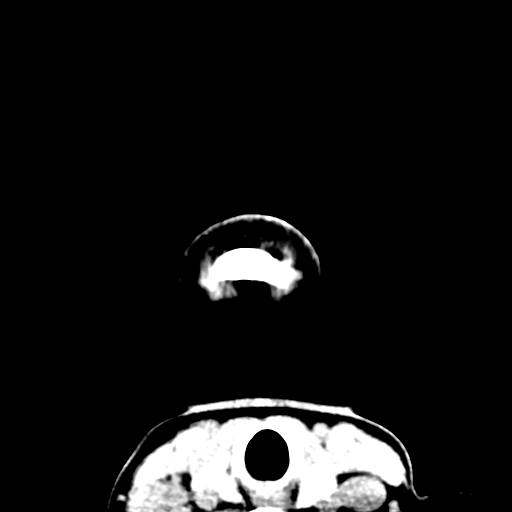
[im 16/81  brain]
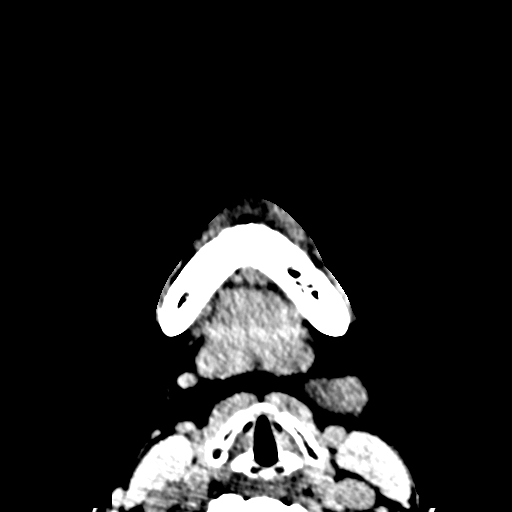
[im 27/81  brain]
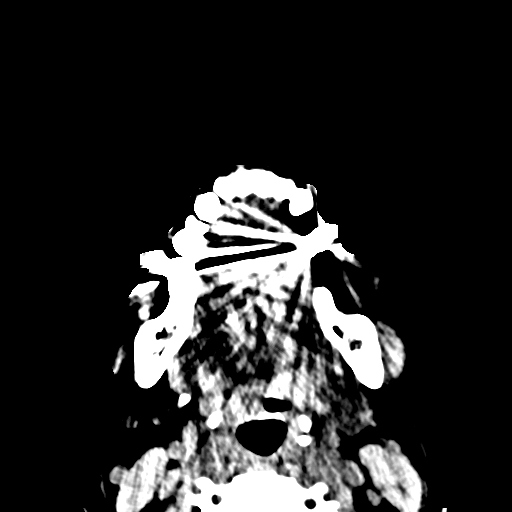
[im 35/81  brain]
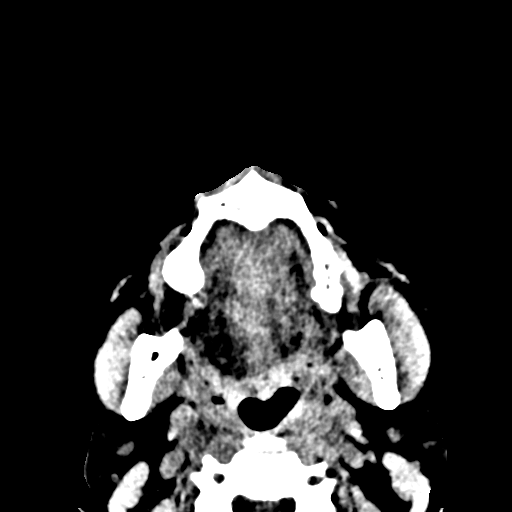
[im 46/81  brain]
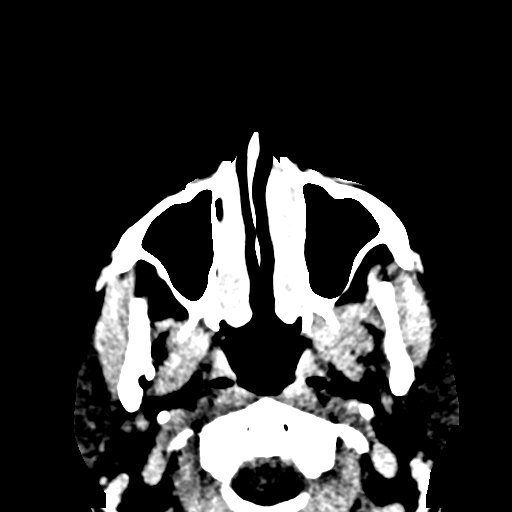
[im 54/81  brain]
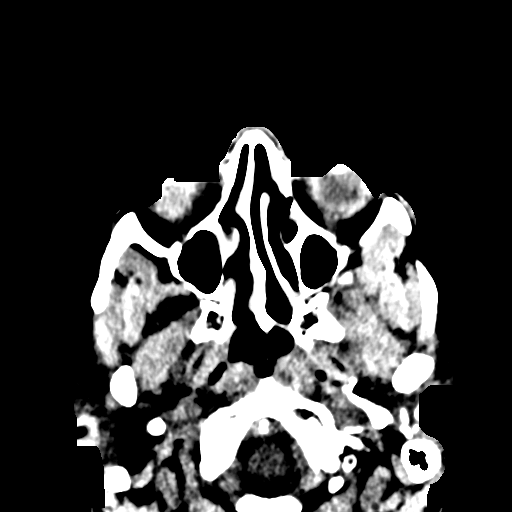

[Series 10: coronal soft · coronal · 0.31mm/px · 3 of 72 slices shown]
[im 34/72  bone]
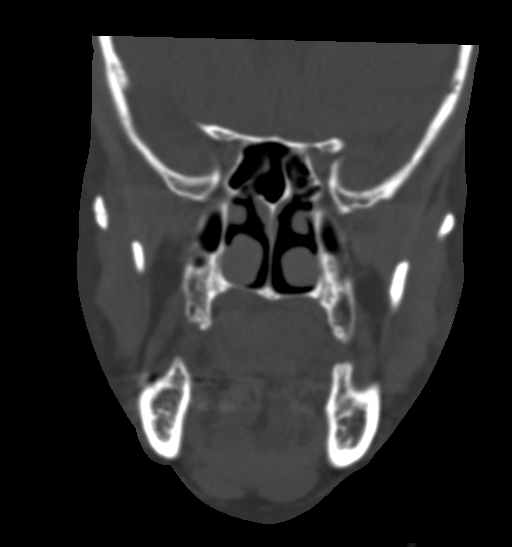
[im 43/72  bone]
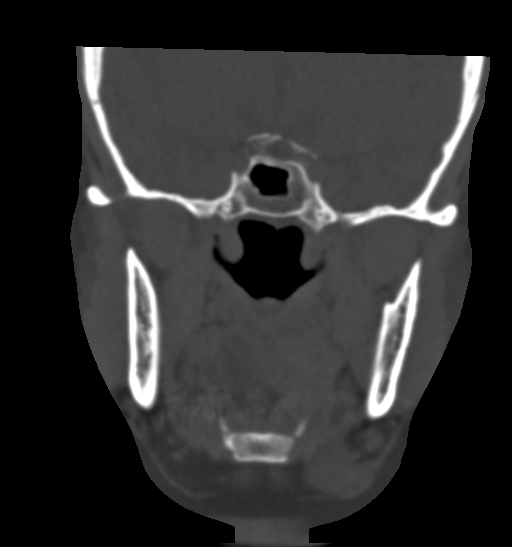
[im 53/72  bone]
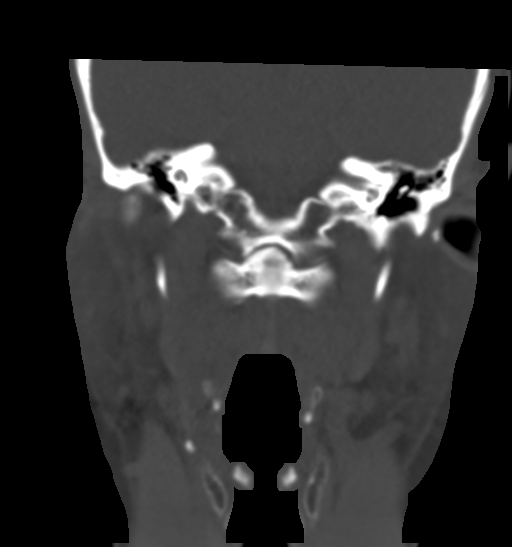

[Series 11: sagittal soft · sagittal · 0.32mm/px · 2 of 68 slices shown]
[im 23/68  bone]
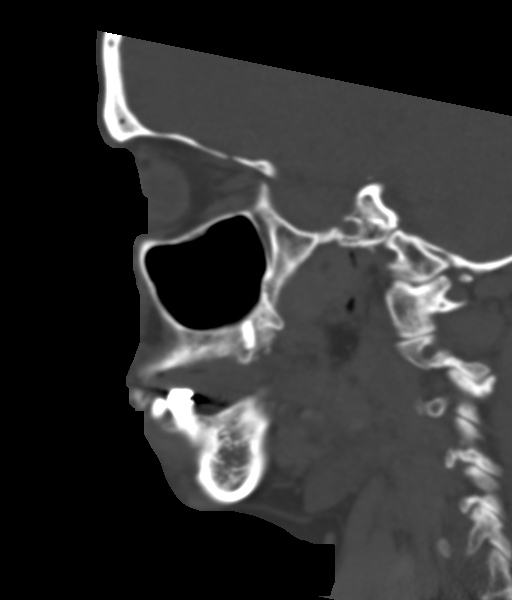
[im 45/68  bone]
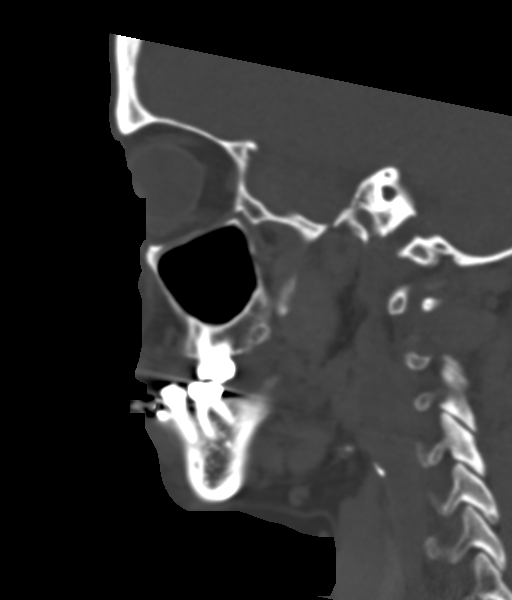

[17 of 47 positions shown; findings below may reference images not displayed]

FINDINGS: CT HEAD FINDINGS

Brain: No evidence of acute infarction, hemorrhage, hydrocephalus,
extra-axial collection or mass lesion/mass effect.

Vascular: No hyperdense vessel or unexpected calcification.

Skull: Intact.  No focal lesion.

Other: None.

CT MAXILLOFACIAL FINDINGS

Osseous: No fracture or mandibular dislocation. No destructive
process. Degenerative disc disease C5-6 incidentally noted.

Orbits: Negative. No traumatic or inflammatory finding.

Sinuses: Clear.

Soft tissues: Negative.
IMPRESSION: No acute abnormality head or face.

Degenerative disc disease C5-6.

## 2019-06-13 ENCOUNTER — Other Ambulatory Visit: Payer: Self-pay

## 2019-06-13 ENCOUNTER — Ambulatory Visit: Payer: Medicare PPO | Admitting: Cardiology

## 2019-06-13 ENCOUNTER — Encounter: Payer: Self-pay | Admitting: Cardiology

## 2019-06-13 VITALS — BP 122/80 | HR 65 | Temp 97.2°F | Ht 61.0 in | Wt 150.0 lb

## 2019-06-13 DIAGNOSIS — I10 Essential (primary) hypertension: Secondary | ICD-10-CM

## 2019-06-13 DIAGNOSIS — E785 Hyperlipidemia, unspecified: Secondary | ICD-10-CM | POA: Diagnosis not present

## 2019-06-13 DIAGNOSIS — Z01812 Encounter for preprocedural laboratory examination: Secondary | ICD-10-CM | POA: Diagnosis not present

## 2019-06-13 DIAGNOSIS — R079 Chest pain, unspecified: Secondary | ICD-10-CM | POA: Diagnosis not present

## 2019-06-13 LAB — BASIC METABOLIC PANEL
BUN/Creatinine Ratio: 13 (ref 12–28)
BUN: 14 mg/dL (ref 8–27)
CO2: 24 mmol/L (ref 20–29)
Calcium: 9.7 mg/dL (ref 8.7–10.3)
Chloride: 93 mmol/L — ABNORMAL LOW (ref 96–106)
Creatinine, Ser: 1.07 mg/dL — ABNORMAL HIGH (ref 0.57–1.00)
GFR calc Af Amer: 60 mL/min/{1.73_m2} (ref >59)
GFR calc non Af Amer: 52 mL/min/{1.73_m2} — ABNORMAL LOW (ref >59)
Glucose: 90 mg/dL (ref 65–99)
Potassium: 4.8 mmol/L (ref 3.5–5.2)
Sodium: 131 mmol/L — ABNORMAL LOW (ref 134–144)

## 2019-06-13 MED ORDER — METOPROLOL TARTRATE 50 MG PO TABS: ORAL_TABLET | ORAL | 0 refills | Status: DC

## 2019-06-13 NOTE — Patient Instructions (Signed)
Medication Instructions:  Your physician recommends that you continue on your current medications as directed. Please refer to the Current Medication list given to you today.  *If you need a refill on your cardiac medications before your next appointment, please call your pharmacy*   Lab Work: BMET today  If you have labs (blood work) drawn today and your tests are completely normal, you will receive your results only by: Marland Kitchen MyChart Message (if you have MyChart) OR . A paper copy in the mail If you have any lab test that is abnormal or we need to change your treatment, we will call you to review the results.   Testing/Procedures: Your physician has requested that you have cardiac CT. Cardiac computed tomography (CT) is a painless test that uses an x-ray machine to take clear, detailed pictures of your heart. For further information please visit [email or web address] Please follow instruction sheet as given.  Your physician has requested that you have an echocardiogram. Echocardiography is a painless test that uses sound waves to create images of your heart. It provides your doctor with information about the size and shape of your heart and how well your heart's chambers and valves are working. This procedure takes approximately one hour. There are no restrictions for this procedure. This will be done at our [facility] location:  [address]   Follow-Up: At [facility], you and your health needs are our priority.  As part of our continuing mission to provide you with exceptional heart care, we have created designated Provider Care Teams.  These Care Teams include your primary Cardiologist (physician) and Advanced Practice Providers (APPs -  Physician Assistants and Nurse Practitioners) who all work together to provide you with the care you need, when you need it.  We recommend signing up for the patient portal called "MyChart".  Sign up information is provided on this  After Visit Summary.  MyChart is used to connect with patients for Virtual Visits (Telemedicine).  Patients are able to view lab/test results, encounter notes, upcoming appointments, etc.  Non-urgent messages can be sent to your provider as well.   To learn more about what you can do with MyChart, go to [email or web address].    Your next appointment:   3 month(s)  The format for your next appointment:   In Person  Provider:   Epifanio Lesches, MD

## 2019-06-18 ENCOUNTER — Telehealth: Payer: Self-pay | Admitting: Cardiology

## 2019-06-18 ENCOUNTER — Other Ambulatory Visit: Payer: Self-pay

## 2019-06-18 DIAGNOSIS — R079 Chest pain, unspecified: Secondary | ICD-10-CM

## 2019-06-18 NOTE — Telephone Encounter (Signed)
Called patient- advised of lab work. Ordered repeat BMET in one week, patient verbalized understanding.

## 2019-06-18 NOTE — Telephone Encounter (Signed)
New Message  Pt returned call regarding her results  Please call back

## 2019-06-24 ENCOUNTER — Other Ambulatory Visit: Payer: Self-pay | Admitting: *Deleted

## 2019-06-24 DIAGNOSIS — I10 Essential (primary) hypertension: Secondary | ICD-10-CM

## 2019-06-24 DIAGNOSIS — Z79899 Other long term (current) drug therapy: Secondary | ICD-10-CM

## 2019-06-25 ENCOUNTER — Telehealth: Payer: Self-pay | Admitting: Cardiology

## 2019-06-25 NOTE — Telephone Encounter (Signed)
The patient has been called and verified her appointments for the echo and lab work on 4/13.

## 2019-06-25 NOTE — Telephone Encounter (Signed)
Patient states she is returning a missed call from the office. She states she is aware that she has BMET scheduled for 06/21/19, on the same day as her echocardiogram.

## 2019-07-01 ENCOUNTER — Ambulatory Visit (HOSPITAL_COMMUNITY): Payer: Medicare PPO | Attending: Internal Medicine

## 2019-07-01 ENCOUNTER — Other Ambulatory Visit: Payer: Medicare PPO

## 2019-07-01 ENCOUNTER — Other Ambulatory Visit: Payer: Self-pay

## 2019-07-01 DIAGNOSIS — Z79899 Other long term (current) drug therapy: Secondary | ICD-10-CM

## 2019-07-01 DIAGNOSIS — R079 Chest pain, unspecified: Secondary | ICD-10-CM | POA: Insufficient documentation

## 2019-07-01 DIAGNOSIS — I10 Essential (primary) hypertension: Secondary | ICD-10-CM

## 2019-07-01 LAB — BASIC METABOLIC PANEL
BUN/Creatinine Ratio: 11 — ABNORMAL LOW (ref 12–28)
BUN: 12 mg/dL (ref 8–27)
CO2: 21 mmol/L (ref 20–29)
Calcium: 9.1 mg/dL (ref 8.7–10.3)
Chloride: 89 mmol/L — ABNORMAL LOW (ref 96–106)
Creatinine, Ser: 1.1 mg/dL — ABNORMAL HIGH (ref 0.57–1.00)
GFR calc Af Amer: 58 mL/min/{1.73_m2} — ABNORMAL LOW (ref >59)
GFR calc non Af Amer: 51 mL/min/{1.73_m2} — ABNORMAL LOW (ref >59)
Glucose: 95 mg/dL (ref 65–99)
Potassium: 4.4 mmol/L (ref 3.5–5.2)
Sodium: 125 mmol/L — ABNORMAL LOW (ref 134–144)

## 2019-07-02 ENCOUNTER — Other Ambulatory Visit: Payer: Self-pay | Admitting: *Deleted

## 2019-07-02 DIAGNOSIS — Z79899 Other long term (current) drug therapy: Secondary | ICD-10-CM

## 2019-07-02 DIAGNOSIS — I10 Essential (primary) hypertension: Secondary | ICD-10-CM

## 2019-07-02 DIAGNOSIS — E871 Hypo-osmolality and hyponatremia: Secondary | ICD-10-CM

## 2019-07-09 ENCOUNTER — Other Ambulatory Visit: Payer: Self-pay | Admitting: Cardiology

## 2019-07-09 ENCOUNTER — Other Ambulatory Visit: Payer: Medicare PPO | Admitting: *Deleted

## 2019-07-09 ENCOUNTER — Other Ambulatory Visit: Payer: Self-pay

## 2019-07-09 LAB — BASIC METABOLIC PANEL
BUN/Creatinine Ratio: 7 — ABNORMAL LOW (ref 12–28)
BUN: 8 mg/dL (ref 8–27)
CO2: 23 mmol/L (ref 20–29)
Calcium: 9.4 mg/dL (ref 8.7–10.3)
Chloride: 96 mmol/L (ref 96–106)
Creatinine, Ser: 1.11 mg/dL — ABNORMAL HIGH (ref 0.57–1.00)
GFR calc Af Amer: 58 mL/min/{1.73_m2} — ABNORMAL LOW (ref >59)
GFR calc non Af Amer: 50 mL/min/{1.73_m2} — ABNORMAL LOW (ref >59)
Glucose: 90 mg/dL (ref 65–99)
Potassium: 4.6 mmol/L (ref 3.5–5.2)
Sodium: 134 mmol/L (ref 134–144)

## 2019-07-21 ENCOUNTER — Telehealth (HOSPITAL_COMMUNITY): Payer: Self-pay | Admitting: *Deleted

## 2019-07-21 NOTE — Telephone Encounter (Signed)
Attempted to call patient regarding upcoming cardiac CT appointment. Left message on voicemail with name and callback number Neesha Langton Tai RN Navigator Cardiac Imaging [facility] [phone] Office [phone] Cell 

## 2019-07-22 ENCOUNTER — Ambulatory Visit (HOSPITAL_COMMUNITY)
Admission: RE | Admit: 2019-07-22 | Discharge: 2019-07-22 | Disposition: A | Discharge status: Home / Self Care | Payer: Medicare PPO | Source: Ambulatory Visit | Attending: Cardiology | Admitting: Cardiology

## 2019-07-22 ENCOUNTER — Telehealth (HOSPITAL_COMMUNITY): Payer: Self-pay | Admitting: Emergency Medicine

## 2019-07-22 ENCOUNTER — Encounter (HOSPITAL_COMMUNITY): Payer: Self-pay

## 2019-07-22 ENCOUNTER — Other Ambulatory Visit: Payer: Self-pay

## 2019-07-22 DIAGNOSIS — R079 Chest pain, unspecified: Secondary | ICD-10-CM | POA: Diagnosis present

## 2019-07-22 DIAGNOSIS — I2510 Atherosclerotic heart disease of native coronary artery without angina pectoris: Secondary | ICD-10-CM | POA: Insufficient documentation

## 2019-07-22 DIAGNOSIS — Z79899 Other long term (current) drug therapy: Secondary | ICD-10-CM

## 2019-07-22 DIAGNOSIS — E119 Type 2 diabetes mellitus without complications: Secondary | ICD-10-CM

## 2019-07-22 MED ORDER — IOHEXOL 350 MG/ML SOLN
100.0000 mL | Freq: Once | INTRAVENOUS | Status: AC | PRN
  Administered 2019-07-22: 100 mL via INTRAVENOUS

## 2019-07-22 MED ORDER — NITROGLYCERIN 0.4 MG SL SUBL
0.8000 mg | SUBLINGUAL_TABLET | Freq: Once | SUBLINGUAL | Status: AC
  Administered 2019-07-22: 0.8 mg via SUBLINGUAL

## 2019-07-22 MED ORDER — NITROGLYCERIN 0.4 MG SL SUBL
SUBLINGUAL_TABLET | SUBLINGUAL | Status: AC
  Filled 2019-07-22: qty 2

## 2019-07-22 NOTE — Telephone Encounter (Signed)
Left detailed message (ok per DPR)-advised to come to office to get labs on Thursday or Friday.       Order placed.  Will re attempt to confirm message is received.

## 2019-07-22 NOTE — Telephone Encounter (Signed)
Pt returning phone call regarding upcoming cardiac imaging study; pt verbalizes understanding of appt date/time, parking situation and where to check in, pre-test NPO status and medications ordered, and verified current allergies; name and call back number provided for further questions should they arise Vicki Alexandria RN Navigator Cardiac Imaging [facility] [phone] office [phone] cell   Pt states she already took her metformin this morning at 7am. Requesting Vicki Perry have her get follow up labs to check kidney function after contrast injection.

## 2019-07-22 NOTE — Telephone Encounter (Signed)
Vicki Perry, could we check a BMET on Thursday or Friday of this week?

## 2019-07-23 NOTE — Telephone Encounter (Signed)
Patient aware of lab work needed.

## 2019-07-25 ENCOUNTER — Other Ambulatory Visit: Payer: Self-pay

## 2019-07-25 DIAGNOSIS — E871 Hypo-osmolality and hyponatremia: Secondary | ICD-10-CM

## 2019-07-25 DIAGNOSIS — I10 Essential (primary) hypertension: Secondary | ICD-10-CM

## 2019-07-25 DIAGNOSIS — Z79899 Other long term (current) drug therapy: Secondary | ICD-10-CM

## 2019-07-25 DIAGNOSIS — E119 Type 2 diabetes mellitus without complications: Secondary | ICD-10-CM

## 2019-07-26 LAB — BASIC METABOLIC PANEL
BUN/Creatinine Ratio: 10 — ABNORMAL LOW (ref 12–28)
BUN: 9 mg/dL (ref 8–27)
CO2: 19 mmol/L — ABNORMAL LOW (ref 20–29)
Calcium: 8.9 mg/dL (ref 8.7–10.3)
Chloride: 101 mmol/L (ref 96–106)
Creatinine, Ser: 0.9 mg/dL (ref 0.57–1.00)
GFR calc Af Amer: 74 mL/min/{1.73_m2} (ref >59)
GFR calc non Af Amer: 65 mL/min/{1.73_m2} (ref >59)
Glucose: 110 mg/dL — ABNORMAL HIGH (ref 65–99)
Potassium: 5 mmol/L (ref 3.5–5.2)
Sodium: 135 mmol/L (ref 134–144)

## 2019-08-28 ENCOUNTER — Other Ambulatory Visit: Payer: Self-pay | Admitting: Internal Medicine

## 2019-09-05 ENCOUNTER — Other Ambulatory Visit: Payer: Self-pay

## 2019-09-05 ENCOUNTER — Encounter: Payer: Self-pay | Admitting: Cardiology

## 2019-09-05 ENCOUNTER — Ambulatory Visit (INDEPENDENT_AMBULATORY_CARE_PROVIDER_SITE_OTHER): Payer: Medicare PPO | Admitting: Cardiology

## 2019-09-05 ENCOUNTER — Other Ambulatory Visit: Payer: Self-pay | Admitting: Internal Medicine

## 2019-09-05 ENCOUNTER — Telehealth: Payer: Self-pay

## 2019-09-05 VITALS — BP 150/60 | HR 70 | Temp 97.0°F | Ht 61.0 in | Wt 152.6 lb

## 2019-09-05 DIAGNOSIS — I2510 Atherosclerotic heart disease of native coronary artery without angina pectoris: Secondary | ICD-10-CM

## 2019-09-05 DIAGNOSIS — Z1211 Encounter for screening for malignant neoplasm of colon: Secondary | ICD-10-CM

## 2019-09-05 DIAGNOSIS — I10 Essential (primary) hypertension: Secondary | ICD-10-CM | POA: Diagnosis not present

## 2019-09-05 DIAGNOSIS — E785 Hyperlipidemia, unspecified: Secondary | ICD-10-CM

## 2019-09-05 MED ORDER — HYDRALAZINE HCL 25 MG PO TABS: 25.0000 mg | ORAL_TABLET | Freq: Three times a day (TID) | ORAL | 3 refills | Status: DC

## 2019-09-05 NOTE — Patient Instructions (Signed)
Medication Instructions:  INCREASE hydralazine to 25 mg three times daily   *If you need a refill on your cardiac medications before your next appointment, please call your pharmacy*   Lab Work: Lipid panel today  If you have labs (blood work) drawn today and your tests are completely normal, you will receive your results only by: Marland Kitchen MyChart Message (if you have MyChart) OR . A paper copy in the mail If you have any lab test that is abnormal or we need to change your treatment, we will call you to review the results.   Follow-Up: At [facility], you and your health needs are our priority.  As part of our continuing mission to provide you with exceptional heart care, we have created designated Provider Care Teams.  These Care Teams include your primary Cardiologist (physician) and Advanced Practice Providers (APPs -  Physician Assistants and Nurse Practitioners) who all work together to provide you with the care you need, when you need it.  We recommend signing up for the patient portal called "MyChart".  Sign up information is provided on this After Visit Summary.  MyChart is used to connect with patients for Virtual Visits (Telemedicine).  Patients are able to view lab/test results, encounter notes, upcoming appointments, etc.  Non-urgent messages can be sent to your provider as well.   To learn more about what you can do with MyChart, go to [email or web address].    Your next appointment:   3 month(s)  The format for your next appointment:   In Person  Provider:   Epifanio Lesches, MD   Other Instructions Please check your blood pressure at home daily, write it down.  Call the office or send message via Mychart with the readings in 2 weeks for Dr. Bjorn Pippin to review.

## 2019-09-05 NOTE — Progress Notes (Signed)
Cardiology Office Note:    Date:  09/05/2019   ID:  Vicki Perry, DOB 12-04-1950, MRN [id number]  PCP:  Lorre Munroe, NP  Cardiologist:  No primary care provider on file.  Electrophysiologist:  None   Referring MD: Lorre Munroe, NP   Chief Complaint  Patient presents with  . Coronary Artery Disease    History of Present Illness:    Vicki Perry is a 69 y.o. female with a hx of hypertension, hyperlipidemia, diabetes who is referred by Nicki Reaper, NP for evaluation of hypertension and chest pain.  Reports has been having left-sided chest pain.  Describes as dull aching pain.  Has occurred 3-4 times in the last few weeks.  Can occur with exertion and last for up to an hour.  Was walking 3 miles per day, but has not been walking since November.  Denies any chest pain when she was walking in November.  States that she has gained 10 pounds since she stopped working.  Also periodically has left arm pain.  Checks BP at home, has been up to 170s 2 weeks ago but recently has been in the 130s.  No smoking history.  Brother had CVA.  TTE 07/01/2019 (study has been read but not transferring to Epic): normal biventricular function, Grade 2 diastolic dysfunction, no significant valvular disease.  Coronary CTA on 07/22/2019 showed calcium score 71 (72nd percentile), minimal nonobstructive CAD.  Since last clinic visit, she reports that she has been doing well.  Her chest pain has resolved.  Continues to have some issues with indigestion however.  States that she is walking about 3 times a week for 20 minutes.  She denies any exertional chest pain or dyspnea.  Denies any lightheadedness or syncope.  States that her BP has been elevated, has been up to the 180s when she checked a few weeks ago.  States typically is in the 120s to 130s.    Past Medical History:  Diagnosis Date  . Anemia   . Diabetes mellitus   . High cholesterol   . Hypertension     Past Surgical History:  Procedure  Laterality Date  . CATARACT EXTRACTION, BILATERAL Bilateral 03/22/2018  . CESAREAN SECTION    . FOOT SURGERY Right    Bone Spur removal  . TONSILLECTOMY    . WISDOM TOOTH EXTRACTION      Current Medications: Current Meds  Medication Sig  . aspirin EC 81 MG tablet Take 81 mg by mouth daily.  . calcium gluconate 500 MG tablet Take 1 tablet by mouth daily.  . cholecalciferol (VITAMIN D3) 25 MCG (1000 UT) tablet Take 1,000 Units by mouth daily.  Marland Kitchen glucose blood (ONETOUCH VERIO) test strip 1 each by Other route 2 (two) times a day.  . hydrALAZINE (APRESOLINE) 25 MG tablet Take 1 tablet (25 mg total) by mouth 3 (three) times daily.  . Lancets (ONETOUCH ULTRASOFT) lancets Use as directed to test blood sugar once daily E11.9  . losartan (COZAAR) 100 MG tablet Take 1 tablet (100 mg total) by mouth daily. MUST SCHEDULE PHYSICAL  . metFORMIN (GLUCOPHAGE) 500 MG tablet TAKE 1 TABLET BY MOUTH EVERY DAY WITH BREAKFAST  . metoprolol tartrate (LOPRESSOR) 50 MG tablet Take 50 mg (1 tablet) TWO hours prior to CT  . omeprazole (PRILOSEC) 20 MG capsule Take 1 capsule (20 mg total) by mouth daily. (Patient taking differently: Take 20 mg by mouth daily as needed (for heartburn symptoms). )  . simvastatin (ZOCOR) 40  MG tablet TAKE 1 TABLET BY MOUTH DAILY AT 6 PM.  . [DISCONTINUED] hydrALAZINE (APRESOLINE) 10 MG tablet TAKE 1 TABLET BY MOUTH TWICE A DAY     Allergies:   Codeine   Social History   Socioeconomic History  . Marital status: Single    Spouse name: Not on file  . Number of children: Not on file  . Years of education: Not on file  . Highest education level: Not on file  Occupational History  . Not on file  Tobacco Use  . Smoking status: Never Smoker  . Smokeless tobacco: Never Used  Substance and Sexual Activity  . Alcohol use: No  . Drug use: No  . Sexual activity: Not on file  Other Topics Concern  . Not on file  Social History Narrative  . Not on file   Social Determinants of  Health   Financial Resource Strain:   . Difficulty of Paying Living Expenses:   Food Insecurity:   . Worried About Programme researcher, broadcasting/film/video in the Last Year:   . Barista in the Last Year:   Transportation Needs:   . Freight forwarder (Medical):   Marland Kitchen Lack of Transportation (Non-Medical):   Physical Activity:   . Days of Exercise per Week:   . Minutes of Exercise per Session:   Stress:   . Feeling of Stress :   Social Connections:   . Frequency of Communication with Friends and Family:   . Frequency of Social Gatherings with Friends and Family:   . Attends Religious Services:   . Active Member of Clubs or Organizations:   . Attends Banker Meetings:   Marland Kitchen Marital Status:      Family History: The patient's family history includes Cancer (age of onset: 63) in her mother; Diabetes in her maternal grandmother, mother, and sister; Hypertension in her brother, father, mother, and sister. There is no history of Heart disease or Stroke.  ROS:   Please see the history of present illness.     All other systems reviewed and are negative.  EKGs/Labs/Other Studies Reviewed:    The following studies were reviewed today:   EKG:  EKG is not ordered today.  The ekg ordered most recently demonstrates normal sinus rhythm, rate 65, no ST/T abnormalities  Recent Labs: 07/25/2019: BUN 9; Creatinine, Ser 0.90; Potassium 5.0; Sodium 135  Recent Lipid Panel    Component Value Date/Time   CHOL 137 09/03/2018 0851   TRIG 99.0 09/03/2018 0851   HDL 49.30 09/03/2018 0851   CHOLHDL 3 09/03/2018 0851   VLDL 19.8 09/03/2018 0851   LDLCALC 68 09/03/2018 0851    Physical Exam:    VS:  BP (!) 150/60   Pulse 70   Temp (!) 97 F (36.1 C)   Ht 5\' 1"  (1.549 m)   Wt 152 lb 9.6 oz (69.2 kg)   SpO2 98%   BMI 28.83 kg/m     Wt Readings from Last 3 Encounters:  09/05/19 152 lb 9.6 oz (69.2 kg)  06/13/19 150 lb (68 kg)  05/28/19 150 lb (68 kg)     GEN:  Well nourished, well  developed in no acute distress HEENT: Normal NECK: No JVD CARDIAC: RRR, no murmurs, rubs, gallops RESPIRATORY:  Clear to auscultation without rales, wheezing or rhonchi  ABDOMEN: Soft, non-tender, non-distended MUSCULOSKELETAL:  No edema; No deformity  SKIN: Warm and dry NEUROLOGIC:  Alert and oriented x 3 PSYCHIATRIC:  Normal affect  ASSESSMENT:    1. Coronary artery disease involving native coronary artery of native heart without angina pectoris   2. Essential hypertension   3. Hyperlipidemia, unspecified hyperlipidemia type    PLAN:    In order of problems listed above:  CAD: Coronary CTA on 07/22/2019 showed calcium score 71 (72nd percentile), minimal nonobstructive CAD.  Was having atypical chest pain, but reports has resolved.  Echocardiogram 07/01/19 showed normal biventricular function, Grade 2 diastolic dysfunction, no significant valvular disease. -LDL at goal less than 70 (68 on 09/03/2018) on simvastatin 40 mg daily.  Will recheck lipid panel  Hypertension: Did not tolerate hydrochlorothiazide due to hyponatremia or amlodipine due to edema.  Currently on losartan 100 mg daily and hydralazine 10 mg twice daily.  BP is elevated, will increase hydralazine to 25 mg 3 times daily.  Asked patient to monitor BP twice daily for next 2 weeks and call with results.  Hyperlipidemia: On simvastatin 40 mg daily.  LDL 68 on 09/03/2018.  Will recheck lipid panel  Type 2 diabetes: A1c 6.5 on 03/17/2019.  On Metformin  RTC in 3 months    Medication Adjustments/Labs and Tests Ordered: Current medicines are reviewed at length with the patient today.  Concerns regarding medicines are outlined above.  Orders Placed This Encounter  Procedures  . Lipid panel   Meds ordered this encounter  Medications  . hydrALAZINE (APRESOLINE) 25 MG tablet    Sig: Take 1 tablet (25 mg total) by mouth 3 (three) times daily.    Dispense:  270 tablet    Refill:  3    Patient Instructions  Medication  Instructions:  INCREASE hydralazine to 25 mg three times daily   *If you need a refill on your cardiac medications before your next appointment, please call your pharmacy*   Lab Work: Lipid panel today  If you have labs (blood work) drawn today and your tests are completely normal, you will receive your results only by: Marland Kitchen MyChart Message (if you have MyChart) OR . A paper copy in the mail If you have any lab test that is abnormal or we need to change your treatment, we will call you to review the results.   Follow-Up: At [facility], you and your health needs are our priority.  As part of our continuing mission to provide you with exceptional heart care, we have created designated Provider Care Teams.  These Care Teams include your primary Cardiologist (physician) and Advanced Practice Providers (APPs -  Physician Assistants and Nurse Practitioners) who all work together to provide you with the care you need, when you need it.  We recommend signing up for the patient portal called "MyChart".  Sign up information is provided on this After Visit Summary.  MyChart is used to connect with patients for Virtual Visits (Telemedicine).  Patients are able to view lab/test results, encounter notes, upcoming appointments, etc.  Non-urgent messages can be sent to your provider as well.   To learn more about what you can do with MyChart, go to [email or web address].    Your next appointment:   3 month(s)  The format for your next appointment:   In Person  Provider:   Oswaldo Milian, MD   Other Instructions Please check your blood pressure at home daily, write it down.  Call the office or send message via Mychart with the readings in 2 weeks for Dr. Gardiner Rhyme to review.       Signed, Donato Heinz, MD  09/05/2019 5:18 PM  [facility]

## 2019-09-05 NOTE — Telephone Encounter (Signed)
Order as been faxed

## 2019-09-06 LAB — LIPID PANEL
Chol/HDL Ratio: 2.6 ratio (ref 0.0–4.4)
Cholesterol, Total: 114 mg/dL (ref 100–199)
HDL: 44 mg/dL (ref >39)
LDL Chol Calc (NIH): 58 mg/dL (ref 0–99)
Triglycerides: 54 mg/dL (ref 0–149)
VLDL Cholesterol Cal: 12 mg/dL (ref 5–40)

## 2019-09-10 ENCOUNTER — Encounter: Payer: Self-pay | Admitting: *Deleted

## 2019-09-16 LAB — COLOGUARD

## 2019-09-19 ENCOUNTER — Encounter (HOSPITAL_COMMUNITY): Payer: Self-pay | Admitting: Emergency Medicine

## 2019-09-19 ENCOUNTER — Emergency Department (HOSPITAL_COMMUNITY)
Admission: EM | Admit: 2019-09-19 | Discharge: 2019-09-19 | Disposition: A | Discharge status: Home / Self Care | Payer: Medicare PPO | Attending: Emergency Medicine | Admitting: Emergency Medicine

## 2019-09-19 ENCOUNTER — Emergency Department (HOSPITAL_COMMUNITY): Payer: Medicare PPO

## 2019-09-19 ENCOUNTER — Other Ambulatory Visit: Payer: Self-pay

## 2019-09-19 DIAGNOSIS — Z7984 Long term (current) use of oral hypoglycemic drugs: Secondary | ICD-10-CM | POA: Insufficient documentation

## 2019-09-19 DIAGNOSIS — R0789 Other chest pain: Secondary | ICD-10-CM | POA: Diagnosis not present

## 2019-09-19 DIAGNOSIS — Z7982 Long term (current) use of aspirin: Secondary | ICD-10-CM | POA: Insufficient documentation

## 2019-09-19 DIAGNOSIS — Z79899 Other long term (current) drug therapy: Secondary | ICD-10-CM | POA: Insufficient documentation

## 2019-09-19 DIAGNOSIS — E119 Type 2 diabetes mellitus without complications: Secondary | ICD-10-CM | POA: Insufficient documentation

## 2019-09-19 DIAGNOSIS — I10 Essential (primary) hypertension: Secondary | ICD-10-CM | POA: Diagnosis not present

## 2019-09-19 DIAGNOSIS — R519 Headache, unspecified: Secondary | ICD-10-CM | POA: Diagnosis not present

## 2019-09-19 LAB — CBC WITH DIFFERENTIAL/PLATELET
Abs Immature Granulocytes: 0.02 10*3/uL (ref 0.00–0.07)
Basophils Absolute: 0.1 10*3/uL (ref 0.0–0.1)
Basophils Relative: 1 %
Eosinophils Absolute: 0.2 10*3/uL (ref 0.0–0.5)
Eosinophils Relative: 3 %
HCT: 37.8 % (ref 36.0–46.0)
Hemoglobin: 11.7 g/dL — ABNORMAL LOW (ref 12.0–15.0)
Immature Granulocytes: 0 %
Lymphocytes Relative: 21 %
Lymphs Abs: 1.7 10*3/uL (ref 0.7–4.0)
MCH: 26.8 pg (ref 26.0–34.0)
MCHC: 31 g/dL (ref 30.0–36.0)
MCV: 86.5 fL (ref 80.0–100.0)
Monocytes Absolute: 0.4 10*3/uL (ref 0.1–1.0)
Monocytes Relative: 6 %
Neutro Abs: 5.4 10*3/uL (ref 1.7–7.7)
Neutrophils Relative %: 69 %
Platelets: 353 10*3/uL (ref 150–400)
RBC: 4.37 MIL/uL (ref 3.87–5.11)
RDW: 12.6 % (ref 11.5–15.5)
WBC: 7.8 10*3/uL (ref 4.0–10.5)
nRBC: 0 % (ref 0.0–0.2)

## 2019-09-19 LAB — BASIC METABOLIC PANEL
Anion gap: 11 (ref 5–15)
BUN: 8 mg/dL (ref 8–23)
CO2: 23 mmol/L (ref 22–32)
Calcium: 9.9 mg/dL (ref 8.9–10.3)
Chloride: 103 mmol/L (ref 98–111)
Creatinine, Ser: 1.1 mg/dL — ABNORMAL HIGH (ref 0.44–1.00)
GFR calc Af Amer: 58 mL/min — ABNORMAL LOW (ref >60)
GFR calc non Af Amer: 50 mL/min — ABNORMAL LOW (ref >60)
Glucose, Bld: 108 mg/dL — ABNORMAL HIGH (ref 70–99)
Potassium: 4.3 mmol/L (ref 3.5–5.1)
Sodium: 137 mmol/L (ref 135–145)

## 2019-09-19 LAB — COLOGUARD: COLOGUARD: NEGATIVE

## 2019-09-19 LAB — TROPONIN I (HIGH SENSITIVITY)
Troponin I (High Sensitivity): 5 ng/L (ref <18)
Troponin I (High Sensitivity): 5 ng/L (ref <18)

## 2019-09-19 LAB — URINALYSIS, ROUTINE W REFLEX MICROSCOPIC
Bilirubin Urine: NEGATIVE
Glucose, UA: NEGATIVE mg/dL
Ketones, ur: NEGATIVE mg/dL
Nitrite: NEGATIVE
Protein, ur: NEGATIVE mg/dL
Specific Gravity, Urine: 1.005 (ref 1.005–1.030)
pH: 7 (ref 5.0–8.0)

## 2019-09-19 MED ORDER — AMOXICILLIN-POT CLAVULANATE 875-125 MG PO TABS
1.0000 | ORAL_TABLET | Freq: Two times a day (BID) | ORAL | 0 refills | Status: DC
Start: 2019-09-19 — End: 2019-12-10

## 2019-09-19 MED ORDER — ACETAMINOPHEN 500 MG PO TABS
1000.0000 mg | ORAL_TABLET | Freq: Once | ORAL | Status: AC
  Administered 2019-09-19: 1000 mg via ORAL
  Filled 2019-09-19: qty 2

## 2019-09-19 MED ORDER — SODIUM CHLORIDE 0.9 % IV BOLUS
1000.0000 mL | Freq: Once | INTRAVENOUS | Status: AC
  Administered 2019-09-19: 1000 mL via INTRAVENOUS

## 2019-09-19 NOTE — ED Triage Notes (Signed)
Pt. Stated, My BP keeps going up since yesterday.

## 2019-09-19 NOTE — ED Provider Notes (Addendum)
[facility] EMERGENCY DEPARTMENT Provider Note   CSN: [id number] Arrival date & time: 09/19/19  1216     History Chief Complaint  Patient presents with   Hypertension   Headache    Vicki Perry is a 69 y.o. female with a past medical history significant for anemia, type 2 diabetes, hyperlipidemia, hypertension.  Patient is not on anticoagulation.  HPI Patient presents to the emergency department today with chief complaint of hypertension and headache x2 days.  Patient tells me she also had chest pain that started today.  The chest pain is located in the center of her chest.  Onset was after she was cleaning her kitchen.  She denies any radiation of pain or pain being worse with exertion.  She states pain was constant x2 hours and resolved spontaneously. When she had the pain she rates it 6/10 in severity. It felt slightly different than her typical heart burn. She states her headache have progressively worsened since onset.  She states she typically does not get headaches and that concerned her causing her to come into the emergency department today. The headache is located across her forehead and is a throbbing sensation. Headache has been constant. She denies any associated neck pain or stiffness.  She rates the pain 8 out of 10 in severity.  Pain does not radiate. She checks her blood pressure frequently at home and had systolics in the low 200s yesterday. She is prescribed hydralazine 25 mg 3 times daily.  She states for the last 2 days she has not taken her afternoon dose therefor only taking it BID. No other medications for symptoms prior to arrival. Denies fever, chills, diaphoresis, palpitations, syncope, head trauma, photophobia, phonophobia, UL throbbing, N/V, visual changes, rash, or "thunderclap" onset, dizziness, lower extremity edema.      Past Medical History:  Diagnosis Date   Anemia    Diabetes mellitus    High cholesterol    Hypertension      Patient Active Problem List   Diagnosis Date Noted   GERD (gastroesophageal reflux disease) 03/02/2015   DM type 2 (diabetes mellitus, type 2) (HCC) 02/10/2013   Anemia 10/18/2010   Hypertension 10/18/2010   Hypercholesteremia 10/18/2010    Past Surgical History:  Procedure Laterality Date   CATARACT EXTRACTION, BILATERAL Bilateral 03/22/2018   CESAREAN SECTION     FOOT SURGERY Right    Bone Spur removal   TONSILLECTOMY     WISDOM TOOTH EXTRACTION       OB History   No obstetric history on file.     Family History  Problem Relation Age of Onset   Diabetes Mother    Hypertension Mother    Cancer Mother 3       Cervical   Hypertension Father    Diabetes Maternal Grandmother    Hypertension Sister    Diabetes Sister    Hypertension Brother    Heart disease Neg Hx    Stroke Neg Hx     Social History   Tobacco Use   Smoking status: Never Smoker   Smokeless tobacco: Never Used  Substance Use Topics   Alcohol use: No   Drug use: No    Home Medications Prior to Admission medications   Medication Sig Start Date End Date Taking? Authorizing Provider  aspirin EC 81 MG tablet Take 81 mg by mouth daily.   Yes [provider]  calcium gluconate 500 MG tablet Take 1 tablet by mouth daily.   Yes  [provider]  cholecalciferol (VITAMIN D3) 25 MCG (1000 UT) tablet Take 1,000 Units by mouth daily.   Yes [provider]  hydrALAZINE (APRESOLINE) 25 MG tablet Take 1 tablet (25 mg total) by mouth 3 (three) times daily. 09/05/19  Yes [name], MD  losartan (COZAAR) 100 MG tablet Take 1 tablet (100 mg total) by mouth daily. MUST SCHEDULE PHYSICAL 08/28/19  Yes Baity, Salvadore Oxford, NP  metFORMIN (GLUCOPHAGE) 500 MG tablet TAKE 1 TABLET BY MOUTH EVERY DAY WITH BREAKFAST Patient taking differently: Take 500 mg by mouth daily with breakfast.  06/03/19  Yes Baity, Salvadore Oxford, NP  simvastatin (ZOCOR) 40 MG tablet TAKE 1  TABLET BY MOUTH DAILY AT 6 PM. Patient taking differently: Take 40 mg by mouth every evening.  06/03/19  Yes Lorre Munroe, NP  amoxicillin-clavulanate (AUGMENTIN) 875-125 MG tablet Take 1 tablet by mouth every 12 (twelve) hours. 09/19/19   Cachet Mccutchen E, PA-C  glucose blood (ONETOUCH VERIO) test strip 1 each by Other route 2 (two) times a day. 09/04/18   Lorre Munroe, NP  hydrochlorothiazide (HYDRODIURIL) 25 MG tablet Take 25 mg by mouth daily. Patient not taking: Reported on 09/19/2019 07/02/19   [provider]  Lancets Letta Pate ULTRASOFT) lancets Use as directed to test blood sugar once daily E11.9 08/24/16   Lorre Munroe, NP  metoprolol tartrate (LOPRESSOR) 50 MG tablet Take 50 mg (1 tablet) TWO hours prior to CT Patient not taking: Reported on 09/19/2019 06/13/19   [name], MD  omeprazole (PRILOSEC) 20 MG capsule Take 1 capsule (20 mg total) by mouth daily. Patient not taking: Reported on 09/19/2019 03/02/15   Lorre Munroe, NP    Allergies    Codeine  Review of Systems   Review of Systems All other systems are reviewed and are negative for acute change except as noted in the HPI.  Physical Exam Updated Vital Signs BP (!) 175/62 (BP Location: Right Arm)    Pulse 68    Temp 99.1 F (37.3 C) (Oral)    Resp 16    SpO2 99%   Physical Exam Vitals and nursing note reviewed.  Constitutional:      General: She is not in acute distress.    Appearance: She is not ill-appearing.  HENT:     Head: Normocephalic and atraumatic.     Comments: No sinus tenderness.  +Maxillary sinus tenderness       Right Ear: Tympanic membrane and external ear normal.     Left Ear: Tympanic membrane and external ear normal.     Nose: Nose normal.     Mouth/Throat:     Mouth: Mucous membranes are moist.     Pharynx: Oropharynx is clear.  Eyes:     General: No scleral icterus.       Right eye: No discharge.        Left eye: No discharge.     Extraocular Movements:  Extraocular movements intact.     Conjunctiva/sclera: Conjunctivae normal.     Pupils: Pupils are equal, round, and reactive to light.  Neck:     Vascular: No JVD.     Comments: Full ROM intact without spinous process TTP. No bony stepoffs or deformities, no paraspinous muscle TTP or muscle spasms. No rigidity or meningeal signs. No bruising, erythema, or swelling.  Cardiovascular:     Rate and Rhythm: Normal rate and regular rhythm.     Pulses: Normal pulses.  Radial pulses are 2+ on the right side and 2+ on the left side.     Heart sounds: Normal heart sounds.  Pulmonary:     Comments: Lungs clear to auscultation in all fields. Symmetric chest rise. No wheezing, rales, or rhonchi. Abdominal:     Tenderness: There is no right CVA tenderness or left CVA tenderness.     Comments: Abdomen is soft, non-distended, and non-tender in all quadrants. No rigidity, no guarding. No peritoneal signs.  Musculoskeletal:        General: Normal range of motion.     Cervical back: Normal range of motion.     Right lower leg: No edema.     Left lower leg: No edema.  Skin:    General: Skin is warm and dry.     Capillary Refill: Capillary refill takes less than 2 seconds.  Neurological:     Mental Status: She is oriented to person, place, and time.     GCS: GCS eye subscore is 4. GCS verbal subscore is 5. GCS motor subscore is 6.     Comments: Speech is clear and goal oriented, follows commands CN III-XII intact, no facial droop Normal strength in upper and lower extremities bilaterally including dorsiflexion and plantar flexion, strong and equal grip strength Sensation normal to light and sharp touch Moves extremities without ataxia, coordination intact Normal finger to nose and rapid alternating movements Normal gait and balance  Psychiatric:        Behavior: Behavior normal.     ED Results / Procedures / Treatments   Labs (all labs ordered are listed, but only abnormal results are  displayed) Labs Reviewed  BASIC METABOLIC PANEL - Abnormal; Notable for the following components:      Result Value   Glucose, Bld 108 (*)    Creatinine, Ser 1.10 (*)    GFR calc non Af Amer 50 (*)    GFR calc Af Amer 58 (*)    All other components within normal limits  CBC WITH DIFFERENTIAL/PLATELET - Abnormal; Notable for the following components:   Hemoglobin 11.7 (*)    All other components within normal limits  URINALYSIS, ROUTINE W REFLEX MICROSCOPIC - Abnormal; Notable for the following components:   Color, Urine COLORLESS (*)    Hgb urine dipstick SMALL (*)    Leukocytes,Ua MODERATE (*)    Bacteria, UA RARE (*)    All other components within normal limits  URINE CULTURE  TROPONIN I (HIGH SENSITIVITY)  TROPONIN I (HIGH SENSITIVITY)    EKG EKG Interpretation  Date/Time:  Friday September 19 2019 16:12:12 EDT Ventricular Rate:  68 PR Interval:    QRS Duration: 85 QT Interval:  408 QTC Calculation: 434 R Axis:   63 Text Interpretation: Sinus rhythm No STEMI Confirmed by Alvester Chourifan, Matthew [id number]) on 09/19/2019 4:30:17 PM   Radiology DG Chest 2 View  Result Date: 09/19/2019 CLINICAL DATA:  Chest pain. Hypertension. Additional provided: Severely high blood pressure, dizziness and headache for 2 days. EXAM: CHEST - 2 VIEW COMPARISON:  No pertinent prior studies available for comparison. FINDINGS: There is cardiomegaly. There is no appreciable airspace consolidation. No frank pulmonary edema. No evidence of pleural effusion or pneumothorax. No acute bony abnormality identified. IMPRESSION: Cardiomegaly. No appreciable airspace consolidation or pulmonary edema. Electronically Signed   By: Jackey LogeKyle  Golden DO   On: 09/19/2019 17:02   CT Head Wo Contrast  Result Date: 09/19/2019 CLINICAL DATA:  Headache, acute, normal neuro exam. Additional provided: Headache, elevated blood  pressure. EXAM: CT HEAD WITHOUT CONTRAST TECHNIQUE: Contiguous axial images were obtained from the base of the skull  through the vertex without intravenous contrast. COMPARISON:  Head CT 08/16/2017. FINDINGS: Brain: Cerebral volume is normal for age. There is no acute intracranial hemorrhage. No demarcated cortical infarct. No extra-axial fluid collection. No evidence of intracranial mass. No midline shift. Vascular: No hyperdense vessel. Skull: Normal. Negative for fracture or focal lesion. Sinuses/Orbits: Visualized orbits show no acute finding. Minimal ethmoid sinus mucosal thickening. No significant mastoid effusion. IMPRESSION: Unremarkable non-contrast CT appearance of the brain for age. No evidence of acute intracranial abnormality. Minimal ethmoid sinus mucosal thickening. Electronically Signed   By: Jackey Loge DO   On: 09/19/2019 17:01    Procedures Procedures (including critical care time)  Medications Ordered in ED Medications  acetaminophen (TYLENOL) tablet 1,000 mg (1,000 mg Oral Given 09/19/19 1610)  sodium chloride 0.9 % bolus 1,000 mL (0 mLs Intravenous Stopped 09/19/19 1822)    ED Course  I have reviewed the triage vital signs and the nursing notes.  Pertinent labs & imaging results that were available during my care of the patient were reviewed by me and considered in my medical decision making (see chart for details).  Clinical Course as of Sep 18 1921  Fri Sep 19, 2019  1817 69 yo female presenting with hypertension, chest pain, and headache.  Her PCP switched her onto hydralazine 2 weeks ago but she has only been taking it BID instead of TID as prescribed.  She is pain free and asymptomatic on my evaluation, with a benign neuro exam.  Labs look okay.  Trop 5, ecg nonischemic, cr 1.1.  pending 2nd trop and discharge, advised taking home meds as prescribed, f/u with PCP.  She verbalized understanding    [MT]    Clinical Course User Index [MT] Trifan, Kermit Balo, MD   MDM Rules/Calculators/A&P                          History provided by patient with additional history obtained from chart  review.    Patient seen and examined. Patient presents awake, alert, hemodynamically stable, afebrile, non toxic.  Blood pressure in triage was 212/63.  She is afebrile vitals otherwise stable.  On exam BP is 197/77.  Patient has a normal neuro exam, no focal weakness.  Patient given IV fluids and Tylenol. EKG without ischemia. Heart score of 4. EKG without ischemia, no STEMI.  She is low risk Wells.  CBC without leukocytosis, hemoglobin consistent with baseline. BMP without severe electrolyte derangement, creatinine consistent with baseline. Delta troponin flat. UA without proteinuria.  She does have moderate leukocytes and 21-50 WBC with rare bacteria.  She denies any urinary symptoms, will send for urine culture hold off on antibiotics at this time.  It is reassuring that she has no abdominal tenderness, no peritoneal signs, no CVA tenderness.   Chest xray shows cardiomegaly, no signs of acute infectious processes. CT head is negative for signs of bleed or CVA, radiologist did comment on minimal ethmoid sinus mucosal thickening.  Patient does have maxillary sinus tenderness on exam.  Which could be the cause of her headache.  Will give prescription for Augmentin.  Advised patient to monitor symptoms x1 week and hold off on taking antibiotics unless she develops fever or has worsening symptoms of a sinus infection. She is agreeable with this plan.  Headache significantly improved after Tylenol and she continues to be chest  pain-free  The patient appears reasonably screened and/or stabilized for discharge and I doubt any other medical condition or other [facility] requiring further screening, evaluation, or treatment in the ED at this time prior to discharge. The patient is safe for discharge with strict return precautions discussed. Recommend pcp follow up. The patient was discussed with and seen by Dr. Renaye Rakers who agrees with the treatment plan.   Portions of this note were generatd with [employer]. Dictation errors may occur despite best attempts at proofreading.   Final Clinical Impression(s) / ED Diagnoses Final diagnoses:  Nonintractable headache, unspecified chronicity pattern, unspecified headache type  Atypical chest pain    Rx / DC Orders ED Discharge Orders         Ordered    amoxicillin-clavulanate (AUGMENTIN) 875-125 MG tablet  Every 12 hours     Discontinue  Reprint     09/19/19 1833           Sherene Sires, PA-C 09/19/19 1924    Sherene Sires, PA-C 09/19/19 1925    Terald Sleeper, MD 09/20/19 [phone]

## 2019-09-19 NOTE — Discharge Instructions (Addendum)
Prescription printed for Augmentin.  This is for sinus infection.  If you develop a fever or you still have sinus congestion or other symptoms of a sinus infection in 1 week you should take this medicine.  You can start using Flonase today for your sinus congestion.  You can buy this over-the-counter.  Your blood test for your heart were normal today.  Return to the emergency department for any new or worsening symptoms.   Follow-up with your primary care doctor to have your blood pressure rechecked next week.  It is important you take your blood pressure medications in the meantime as they are prescribed.

## 2019-09-19 NOTE — ED Notes (Signed)
Pt discharge instructions reviewed with the patient. The patient verbalized understanding of instructions. Pt discharged. 

## 2019-09-23 ENCOUNTER — Other Ambulatory Visit: Payer: Self-pay

## 2019-09-23 ENCOUNTER — Encounter: Payer: Self-pay | Admitting: Internal Medicine

## 2019-09-23 ENCOUNTER — Ambulatory Visit: Payer: Medicare PPO | Admitting: Internal Medicine

## 2019-09-23 VITALS — BP 144/66 | HR 82 | Temp 97.7°F | Wt 151.0 lb

## 2019-09-23 DIAGNOSIS — J329 Chronic sinusitis, unspecified: Secondary | ICD-10-CM

## 2019-09-23 DIAGNOSIS — I10 Essential (primary) hypertension: Secondary | ICD-10-CM | POA: Diagnosis not present

## 2019-09-23 DIAGNOSIS — R519 Headache, unspecified: Secondary | ICD-10-CM

## 2019-09-23 DIAGNOSIS — B9789 Other viral agents as the cause of diseases classified elsewhere: Secondary | ICD-10-CM

## 2019-09-23 DIAGNOSIS — R0789 Other chest pain: Secondary | ICD-10-CM | POA: Diagnosis not present

## 2019-09-23 MED ORDER — PREDNISONE 10 MG PO TABS: ORAL_TABLET | ORAL | 0 refills | Status: DC

## 2019-09-23 NOTE — Progress Notes (Signed)
Subjective:    Patient ID: Vicki Perry, female    DOB: 1950/09/03, 69 y.o.   MRN: [id number]  HPI  Pt presents to the clinic today for ER followup. She went to the ER 09/19/19 with c/o headache, chest pain and elevated blood pressure. Her BP was 200+systolic. Chest xray was negative. ECG was normal. Labs were unremarkable. CT head was negative for acute findings. She was treated with Tylenol and fluid bolus. She was given a RX for Augmentin for possible sinus infection but was advised not to start this unless symptoms worsen or she develops a fever. Since discharge, she reports persistent headache. The headache is located above her eyes. She describes the pain as tender. She denies dizziness, visual changes, sensitivity to light or sounds, nausea or vomiting. She reports some nasal congestion. She is blowing clear mucous out of her nose. She denies runny nose, ear pain, sore throat, loss of taste or smell, or SOB. She has a slight cough, productive of clear mucous. She denies fever, chills or body aches. She has not had any more episodes of chest pain, but has had some mild indigestion. Her BP today is 144/66, managed on Hydralazine, Metoprolol, and Losartan.   She also reports intermittent numbness in his hands. This started 5 days ago. She reports associated pain that radiates up her forearms. She denies numbness or weakness. She denies any injury to the area. She has not taken anything OTC for this.  Review of Systems      Past Medical History:  Diagnosis Date  . Anemia   . Diabetes mellitus   . High cholesterol   . Hypertension     Current Outpatient Medications  Medication Sig Dispense Refill  . amoxicillin-clavulanate (AUGMENTIN) 875-125 MG tablet Take 1 tablet by mouth every 12 (twelve) hours. 14 tablet 0  . aspirin EC 81 MG tablet Take 81 mg by mouth daily.    . calcium gluconate 500 MG tablet Take 1 tablet by mouth daily.    . cholecalciferol (VITAMIN D3) 25 MCG (1000 UT)  tablet Take 1,000 Units by mouth daily.    Marland Kitchen glucose blood (ONETOUCH VERIO) test strip 1 each by Other route 2 (two) times a day. 200 each 2  . hydrALAZINE (APRESOLINE) 25 MG tablet Take 1 tablet (25 mg total) by mouth 3 (three) times daily. 270 tablet 3  . hydrochlorothiazide (HYDRODIURIL) 25 MG tablet Take 25 mg by mouth daily. (Patient not taking: Reported on 09/19/2019)    . Lancets (ONETOUCH ULTRASOFT) lancets Use as directed to test blood sugar once daily E11.9 100 each 12  . losartan (COZAAR) 100 MG tablet Take 1 tablet (100 mg total) by mouth daily. MUST SCHEDULE PHYSICAL 90 tablet 0  . metFORMIN (GLUCOPHAGE) 500 MG tablet TAKE 1 TABLET BY MOUTH EVERY DAY WITH BREAKFAST (Patient taking differently: Take 500 mg by mouth daily with breakfast. ) 90 tablet 0  . metoprolol tartrate (LOPRESSOR) 50 MG tablet Take 50 mg (1 tablet) TWO hours prior to CT (Patient not taking: Reported on 09/19/2019) 1 tablet 0  . omeprazole (PRILOSEC) 20 MG capsule Take 1 capsule (20 mg total) by mouth daily. (Patient not taking: Reported on 09/19/2019) 90 capsule 1  . simvastatin (ZOCOR) 40 MG tablet TAKE 1 TABLET BY MOUTH DAILY AT 6 PM. (Patient taking differently: Take 40 mg by mouth every evening. ) 90 tablet 0   No current facility-administered medications for this visit.    Allergies  Allergen Reactions  .  Codeine Nausea Only    Family History  Problem Relation Age of Onset  . Diabetes Mother   . Hypertension Mother   . Cancer Mother 68       Cervical  . Hypertension Father   . Diabetes Maternal Grandmother   . Hypertension Sister   . Diabetes Sister   . Hypertension Brother   . Heart disease Neg Hx   . Stroke Neg Hx     Social History   Socioeconomic History  . Marital status: Single    Spouse name: Not on file  . Number of children: Not on file  . Years of education: Not on file  . Highest education level: Not on file  Occupational History  . Not on file  Tobacco Use  . Smoking status:  Never Smoker  . Smokeless tobacco: Never Used  Substance and Sexual Activity  . Alcohol use: No  . Drug use: No  . Sexual activity: Not on file  Other Topics Concern  . Not on file  Social History Narrative  . Not on file   Social Determinants of Health   Financial Resource Strain:   . Difficulty of Paying Living Expenses:   Food Insecurity:   . Worried About Programme researcher, broadcasting/film/video in the Last Year:   . Barista in the Last Year:   Transportation Needs:   . Freight forwarder (Medical):   Marland Kitchen Lack of Transportation (Non-Medical):   Physical Activity:   . Days of Exercise per Week:   . Minutes of Exercise per Session:   Stress:   . Feeling of Stress :   Social Connections:   . Frequency of Communication with Friends and Family:   . Frequency of Social Gatherings with Friends and Family:   . Attends Religious Services:   . Active Member of Clubs or Organizations:   . Attends Banker Meetings:   Marland Kitchen Marital Status:   Intimate Partner Violence:   . Fear of Current or Ex-Partner:   . Emotionally Abused:   Marland Kitchen Physically Abused:   . Sexually Abused:      Constitutional: Pt reports headache. Denies fever, malaise, fatigue, or abrupt weight changes.  HEENT: Pt reports nasal congestion. Denies eye pain, eye redness, ear pain, ringing in the ears, wax buildup, runny nose, bloody nose, or sore throat. Respiratory: Pt reports cough. Denies difficulty breathing, shortness of breath.   Cardiovascular: Denies chest pain, chest tightness, palpitations or swelling in the hands or feet.  Gastrointestinal: Pt reports intermittent reflux. Denies abdominal pain, bloating, constipation, diarrhea or blood in the stool.  Skin: Denies redness, rashes, lesions or ulcercations.  Neurological: Pt reports numbness in hands. Denies dizziness, difficulty with memory, difficulty with speech or problems with balance and coordination.    No other specific complaints in a complete  review of systems (except as listed in HPI above).  Objective:   Physical Exam   BP (!) 144/66   Pulse 82   Temp 97.7 F (36.5 C) (Temporal)   Wt 151 lb (68.5 kg)   SpO2 98%   BMI 28.53 kg/m   Wt Readings from Last 3 Encounters:  09/05/19 152 lb 9.6 oz (69.2 kg)  06/13/19 150 lb (68 kg)  05/28/19 150 lb (68 kg)    General: Appears herstated age, well developed, well nourished in NAD. Skin: Warm, dry and intact. No rashes noted. HEENT: Head: normal shape and size, maxillary sinus tenderness noted; Eyes: sclera white, no icterus, conjunctiva  pink, PERRLA and EOMs intact; Ears: bilateral cerumen impaction;  Neck:  Neck supple, trachea midline. No masses, lumps or thyromegaly present.  Cardiovascular: Normal rate and rhythm. S1,S2 noted.  No murmur, rubs or gallops noted. No JVD or BLE edema.  Pulmonary/Chest: Normal effort and positive vesicular breath sounds. No respiratory distress. No wheezes, rales or ronchi noted.  Musculoskeletal:  No difficulty with gait.  Neurological: Alert and oriented. Negative Tinel's, negative Phalen's. Coordination normal.    BMET    Component Value Date/Time   NA 137 09/19/2019 1605   NA 135 07/25/2019 1015   K 4.3 09/19/2019 1605   CL 103 09/19/2019 1605   CO2 23 09/19/2019 1605   GLUCOSE 108 (H) 09/19/2019 1605   BUN 8 09/19/2019 1605   BUN 9 07/25/2019 1015   CREATININE 1.10 (H) 09/19/2019 1605   CALCIUM 9.9 09/19/2019 1605   GFRNONAA 50 (L) 09/19/2019 1605   GFRAA 58 (L) 09/19/2019 1605    Lipid Panel     Component Value Date/Time   CHOL 114 09/05/2019 0953   TRIG 54 09/05/2019 0953   HDL 44 09/05/2019 0953   CHOLHDL 2.6 09/05/2019 0953   CHOLHDL 3 09/03/2018 0851   VLDL 19.8 09/03/2018 0851   LDLCALC 58 09/05/2019 0953    CBC    Component Value Date/Time   WBC 7.8 09/19/2019 1605   RBC 4.37 09/19/2019 1605   HGB 11.7 (L) 09/19/2019 1605   HCT 37.8 09/19/2019 1605   PLT 353 09/19/2019 1605   MCV 86.5 09/19/2019 1605     MCH 26.8 09/19/2019 1605   MCHC 31.0 09/19/2019 1605   RDW 12.6 09/19/2019 1605   LYMPHSABS 1.7 09/19/2019 1605   MONOABS 0.4 09/19/2019 1605   EOSABS 0.2 09/19/2019 1605   BASOSABS 0.1 09/19/2019 1605    Hgb A1C Lab Results  Component Value Date   HGBA1C 6.5 (A) 03/17/2019           Assessment & Plan:   ER Follow Up for HTN, Chest Pain, Headache, Sinusitis:  ER notes, labs and imaging reviewed Continue Hydralazine and Losartan as prescribed Discussed BP goal of 150/90 or less, avoid checking BP too many times a day No need for repeat imaging at this time RX for Pred Taper x 6 days No need to fill abx unless symptoms do not improve or worsen No indication for cardiology referral at this time  Return precautions discussed  Nicki Reaper, NP This visit occurred during the SARS-CoV-2 public health emergency.  Safety protocols were in place, including screening questions prior to the visit, additional usage of staff PPE, and extensive cleaning of exam room while observing appropriate contact time as indicated for disinfecting solutions.

## 2019-09-23 NOTE — Patient Instructions (Signed)

## 2019-10-08 ENCOUNTER — Telehealth: Payer: Self-pay

## 2019-10-08 LAB — COLOGUARD: Cologuard: NEGATIVE

## 2019-10-08 NOTE — Telephone Encounter (Signed)
Cologuard (DNA stool test) result:  NEGATIVE A negative result indicates a lower likelihood that colorectal cancer or pre-cancer is present.   Letter mailed to pt 

## 2019-11-08 ENCOUNTER — Other Ambulatory Visit: Payer: Self-pay | Admitting: Internal Medicine

## 2019-11-18 ENCOUNTER — Other Ambulatory Visit: Payer: Self-pay | Admitting: Internal Medicine

## 2019-11-28 ENCOUNTER — Other Ambulatory Visit: Payer: Self-pay | Admitting: Internal Medicine

## 2019-11-29 ENCOUNTER — Other Ambulatory Visit: Payer: Self-pay | Admitting: Internal Medicine

## 2019-12-06 NOTE — Progress Notes (Signed)
Cardiology Office Note:    Date:  12/10/2019   ID:  Vicki Perry, DOB 10/05/1950, MRN [id number]  PCP:  Lorre Munroe, NP  Cardiologist:  No primary care provider on file.  Electrophysiologist:  None   Referring MD: Lorre Munroe, NP   Chief Complaint  Patient presents with  . Chest Pain    History of Present Illness:    Vicki Perry is a 69 y.o. female with a hx of hypertension, hyperlipidemia, diabetes who is referred by Nicki Reaper, NP for evaluation of hypertension and chest pain.  Reports has been having left-sided chest pain.  Describes as dull aching pain.  Has occurred 3-4 times in the last few weeks.  Can occur with exertion and last for up to an hour.  Was walking 3 miles per day, but has not been walking since November.  Denies any chest pain when she was walking in November.  States that she has gained 10 pounds since she stopped working.  Also periodically has left arm pain.  Checks BP at home, has been up to 170s 2 weeks ago but recently has been in the 130s.  No smoking history.  Brother had CVA.  TTE 07/01/2019 (study has been read but not transferring to Epic): normal biventricular function, Grade 2 diastolic dysfunction, no significant valvular disease.  Coronary CTA on 07/22/2019 showed calcium score 71 (72nd percentile), minimal nonobstructive CAD.  Since last clinic visit, she had ED visit for chest pain on 09/20/2019.  Troponins negative. Since her ED visit, she reports she has been doing okay. She continues to have chest pain that occurs after she eats. Also has some lightheadedness with this. She denies any syncope, dyspnea, palpitations, or lower extremity edema. States that she has been checking her BP at home, has been 140s to 150s.   Past Medical History:  Diagnosis Date  . Anemia   . Diabetes mellitus   . High cholesterol   . Hypertension     Past Surgical History:  Procedure Laterality Date  . CATARACT EXTRACTION, BILATERAL Bilateral 03/22/2018   . CESAREAN SECTION    . FOOT SURGERY Right    Bone Spur removal  . TONSILLECTOMY    . WISDOM TOOTH EXTRACTION      Current Medications: Current Meds  Medication Sig  . aspirin EC 81 MG tablet Take 81 mg by mouth daily.  . calcium gluconate 500 MG tablet Take 1 tablet by mouth daily.  . cholecalciferol (VITAMIN D3) 25 MCG (1000 UT) tablet Take 1,000 Units by mouth daily.  Marland Kitchen glucose blood (ONETOUCH VERIO) test strip 1 each by Other route 2 (two) times a day.  . hydrALAZINE (APRESOLINE) 50 MG tablet Take 1 tablet (50 mg total) by mouth 3 (three) times daily.  . Lancets (ONETOUCH ULTRASOFT) lancets Use as directed to test blood sugar once daily E11.9  . losartan (COZAAR) 100 MG tablet TAKE 1 TABLET (100 MG TOTAL) BY MOUTH DAILY. MUST SCHEDULE PHYSICAL  . metFORMIN (GLUCOPHAGE) 500 MG tablet TAKE 1 TABLET BY MOUTH EVERY DAY WITH BREAKFAST  . omeprazole (PRILOSEC) 20 MG capsule Take 1 capsule (20 mg total) by mouth daily.  . predniSONE (DELTASONE) 10 MG tablet Take 3 tabs on days 1-2, take 2 tabs on days 3-4, take 1 tab on days 5-6  . simvastatin (ZOCOR) 40 MG tablet TAKE 1 TABLET BY MOUTH DAILY AT 6 PM.  . [DISCONTINUED] hydrALAZINE (APRESOLINE) 25 MG tablet Take 1 tablet (25 mg total) by mouth  3 (three) times daily.     Allergies:   Codeine   Social History   Socioeconomic History  . Marital status: Single    Spouse name: Not on file  . Number of children: Not on file  . Years of education: Not on file  . Highest education level: Not on file  Occupational History  . Not on file  Tobacco Use  . Smoking status: Never Smoker  . Smokeless tobacco: Never Used  Substance and Sexual Activity  . Alcohol use: No  . Drug use: No  . Sexual activity: Not on file  Other Topics Concern  . Not on file  Social History Narrative  . Not on file   Social Determinants of Health   Financial Resource Strain:   . Difficulty of Paying Living Expenses: Not on file  Food Insecurity:   .  Worried About Programme researcher, broadcasting/film/video in the Last Year: Not on file  . Ran Out of Food in the Last Year: Not on file  Transportation Needs:   . Lack of Transportation (Medical): Not on file  . Lack of Transportation (Non-Medical): Not on file  Physical Activity:   . Days of Exercise per Week: Not on file  . Minutes of Exercise per Session: Not on file  Stress:   . Feeling of Stress : Not on file  Social Connections:   . Frequency of Communication with Friends and Family: Not on file  . Frequency of Social Gatherings with Friends and Family: Not on file  . Attends Religious Services: Not on file  . Active Member of Clubs or Organizations: Not on file  . Attends Banker Meetings: Not on file  . Marital Status: Not on file     Family History: The patient's family history includes Cancer (age of onset: 60) in her mother; Diabetes in her maternal grandmother, mother, and sister; Hypertension in her brother, father, mother, and sister. There is no history of Heart disease or Stroke.  ROS:   Please see the history of present illness.     All other systems reviewed and are negative.  EKGs/Labs/Other Studies Reviewed:    The following studies were reviewed today:   EKG:  EKG is not ordered today.  The ekg ordered most recently demonstrates normal sinus rhythm, rate 65, no ST/T abnormalities  Recent Labs: 09/19/2019: BUN 8; Creatinine, Ser 1.10; Hemoglobin 11.7; Platelets 353; Potassium 4.3; Sodium 137  Recent Lipid Panel    Component Value Date/Time   CHOL 114 09/05/2019 0953   TRIG 54 09/05/2019 0953   HDL 44 09/05/2019 0953   CHOLHDL 2.6 09/05/2019 0953   CHOLHDL 3 09/03/2018 0851   VLDL 19.8 09/03/2018 0851   LDLCALC 58 09/05/2019 0953    Physical Exam:    VS:  BP (!) 206/84 (BP Location: Right Arm)   Pulse 79   Ht 5\' 1"  (1.549 m)   Wt 152 lb 6.4 oz (69.1 kg)   SpO2 98%   BMI 28.80 kg/m     Wt Readings from Last 3 Encounters:  12/10/19 152 lb 6.4 oz (69.1  kg)  09/23/19 151 lb (68.5 kg)  09/05/19 152 lb 9.6 oz (69.2 kg)     GEN:  Well nourished, well developed in no acute distress HEENT: Normal NECK: No JVD CARDIAC: RRR, no murmurs, rubs, gallops RESPIRATORY:  Clear to auscultation without rales, wheezing or rhonchi  ABDOMEN: Soft, non-tender, non-distended MUSCULOSKELETAL:  No edema; No deformity  SKIN: Warm and dry  NEUROLOGIC:  Alert and oriented x 3 PSYCHIATRIC:  Normal affect   ASSESSMENT:    1. Coronary artery disease involving native coronary artery of native heart without angina pectoris   2. Essential hypertension   3. Indigestion   4. Hyperlipidemia, unspecified hyperlipidemia type    PLAN:    In order of problems listed above:  CAD: Coronary CTA on 07/22/2019 showed calcium score 71 (72nd percentile), minimal nonobstructive CAD. Continues to have atypical chest pain, suspect GI etiology as occurs after eating..  Echocardiogram 07/01/19 showed normal biventricular function, Grade 2 diastolic dysfunction, no significant valvular disease. -LDL at goal less than 70 (58 on 09/05/19) on simvastatin 40 mg daily.   -Referred to GI for evaluation  Hypertension: Did not tolerate hydrochlorothiazide due to hyponatremia or amlodipine due to edema.  Currently on losartan 100 mg daily and hydralazine 25 mg 3 times daily. BP significantly elevated in clinic today (206/84) but did not take her medications this morning. Reports BP has been 140s to 150s at home. Increase hydralazine to 50 mg 3 times daily. Will schedule in pharmacy hypertension clinic in 2 weeks. Asked patient to check BP twice daily for next 2 weeks and bring log and home BP monitor to calibrate to her appointment.  Hyperlipidemia: On simvastatin 40 mg daily.  LDL 58 on 09/05/2019  Type 2 diabetes: A1c 6.5 on 03/17/2019.  On Metformin  RTC in 6 months   Medication Adjustments/Labs and Tests Ordered: Current medicines are reviewed at length with the patient today.  Concerns  regarding medicines are outlined above.  Orders Placed This Encounter  Procedures  . Ambulatory referral to Gastroenterology  . AMB Referral to [facility]   Meds ordered this encounter  Medications  . hydrALAZINE (APRESOLINE) 50 MG tablet    Sig: Take 1 tablet (50 mg total) by mouth 3 (three) times daily.    Dispense:  360 tablet    Refill:  1    Patient Instructions  Medication Instructions:  Increase Hydralazine to 50 mg three times daily   *If you need a refill on your cardiac medications before your next appointment, please call your pharmacy*   Follow-Up: At [facility], you and your health needs are our priority.  As part of our continuing mission to provide you with exceptional heart care, we have created designated Provider Care Teams.  These Care Teams include your primary Cardiologist (physician) and Advanced Practice Providers (APPs -  Physician Assistants and Nurse Practitioners) who all work together to provide you with the care you need, when you need it.  We recommend signing up for the patient portal called "MyChart".  Sign up information is provided on this After Visit Summary.  MyChart is used to connect with patients for Virtual Visits (Telemedicine).  Patients are able to view lab/test results, encounter notes, upcoming appointments, etc.  Non-urgent messages can be sent to your provider as well.   To learn more about what you can do with MyChart, go to [email or web address].    Your next appointment:   6 month(s)  The format for your next appointment:   In Person  Provider:   Epifanio Lesches, MD   Other Instructions:  Referral to GI- they will contact you to set up an appointment.   PharmD appointment in 2 weeks- please check blood pressure at home two times daily, bring log and home monitor to this appointment.       Signed, [name], MD  12/10/2019 11:32 PM  [facility]

## 2019-12-10 ENCOUNTER — Ambulatory Visit: Payer: Medicare PPO | Admitting: Cardiology

## 2019-12-10 ENCOUNTER — Other Ambulatory Visit: Payer: Self-pay

## 2019-12-10 ENCOUNTER — Encounter: Payer: Self-pay | Admitting: Cardiology

## 2019-12-10 VITALS — BP 206/84 | HR 79 | Ht 61.0 in | Wt 152.4 lb

## 2019-12-10 DIAGNOSIS — I10 Essential (primary) hypertension: Secondary | ICD-10-CM | POA: Diagnosis not present

## 2019-12-10 DIAGNOSIS — K30 Functional dyspepsia: Secondary | ICD-10-CM

## 2019-12-10 DIAGNOSIS — I2510 Atherosclerotic heart disease of native coronary artery without angina pectoris: Secondary | ICD-10-CM

## 2019-12-10 DIAGNOSIS — E785 Hyperlipidemia, unspecified: Secondary | ICD-10-CM

## 2019-12-10 MED ORDER — HYDRALAZINE HCL 50 MG PO TABS: 50.0000 mg | ORAL_TABLET | Freq: Three times a day (TID) | ORAL | 1 refills | Status: DC

## 2019-12-10 NOTE — Patient Instructions (Signed)
Medication Instructions:  Increase Hydralazine to 50 mg three times daily   *If you need a refill on your cardiac medications before your next appointment, please call your pharmacy*   Follow-Up: At [facility], you and your health needs are our priority.  As part of our continuing mission to provide you with exceptional heart care, we have created designated Provider Care Teams.  These Care Teams include your primary Cardiologist (physician) and Advanced Practice Providers (APPs -  Physician Assistants and Nurse Practitioners) who all work together to provide you with the care you need, when you need it.  We recommend signing up for the patient portal called "MyChart".  Sign up information is provided on this After Visit Summary.  MyChart is used to connect with patients for Virtual Visits (Telemedicine).  Patients are able to view lab/test results, encounter notes, upcoming appointments, etc.  Non-urgent messages can be sent to your provider as well.   To learn more about what you can do with MyChart, go to [email or web address].    Your next appointment:   6 month(s)  The format for your next appointment:   In Person  Provider:   Epifanio Lesches, MD   Other Instructions:  Referral to GI- they will contact you to set up an appointment.   PharmD appointment in 2 weeks- please check blood pressure at home two times daily, bring log and home monitor to this appointment.

## 2019-12-24 ENCOUNTER — Other Ambulatory Visit: Payer: Self-pay

## 2019-12-24 ENCOUNTER — Ambulatory Visit (INDEPENDENT_AMBULATORY_CARE_PROVIDER_SITE_OTHER): Payer: Medicare PPO | Admitting: Pharmacist

## 2019-12-24 VITALS — BP 172/62 | HR 82

## 2019-12-24 DIAGNOSIS — I10 Essential (primary) hypertension: Secondary | ICD-10-CM | POA: Diagnosis not present

## 2019-12-24 MED ORDER — SPIRONOLACTONE 25 MG PO TABS: 12.5000 mg | ORAL_TABLET | Freq: Every day | ORAL | 0 refills | Status: DC

## 2019-12-24 NOTE — Patient Instructions (Addendum)
It was nice meeting you today!  We would like your blood pressure to be less than 130/80  Continue your losartan 100mg  daily and your hydralazine 50 mg twice a day  We are going to start a new medication called spironolactone, which you will take 1/2 tablet once a day in the morning  We will check your lab work in 1 week  Please call with any questions  , PharmD, Laural Golden, CDCES [facility] [address] [place], [place] Phone: [phone]; Fax: [phone] 12/24/2019 9:59 AM

## 2019-12-24 NOTE — Progress Notes (Signed)
Patient ID: Vicki Perry                 DOB: May 24, 1950                      MRN: [id number]     HPI: Vicki Perry is a 69 y.o. female referred by Dr. Bjorn Pippin to HTN clinic. PMH is significant for DM, HLD, and HTN.  Patient recently seen by Dr Bjorn Pippin on 9/22 for evaluation of chest pain.  BP was very elevated at 206/84 although patient reported home readings were in the 140s/150s.  Due to intolerance to HCTZ and amlodipine, hydralazine was increased to 50mg  TID.  Remained on losartan 100mg  daily.  Patient reports today to HTN clinic in good spirits and is motivated to reduce BP.  Could not tolerate three times daily hydralazine.  Reported it made her feel nauseous and "made her feet tingle."  Has been taking hydralazine 50mg  twice daily.  Blood pressure has been slowly decreasing.  Reports this morning reading was 136/66.   Rest of months readings range from 130s-170s systolic.   Has been working on eliminating salt from her diet and be more physically active.    Current HTN meds: losartan 100mg  daily, hydralazine 50 mg BID,  Previously tried: HCTZ (hyponatremia), amlodipine (edema), hydralazine 10mg  and 25mg , lisinopril 20mg , lisinopril 40mg  BP goal: <130/80  Family History: HTN and DM (mother), HTN (father)  Diet: cup and a half of coffee a day.  Cutting back on salt  Wt Readings from Last 3 Encounters:  12/10/19 152 lb 6.4 oz (69.1 kg)  09/23/19 151 lb (68.5 kg)  09/05/19 152 lb 9.6 oz (69.2 kg)   BP Readings from Last 3 Encounters:  12/10/19 (!) 206/84  09/23/19 (!) 144/66  09/19/19 (!) 156/56   Pulse Readings from Last 3 Encounters:  12/10/19 79  09/23/19 82  09/19/19 76    Renal function: CrCl cannot be calculated (Patient's most recent lab result is older than the maximum 21 days allowed.).  Past Medical History:  Diagnosis Date  . Anemia   . Diabetes mellitus   . High cholesterol   . Hypertension     Current Outpatient Medications on File Prior to  Visit  Medication Sig Dispense Refill  . aspirin EC 81 MG tablet Take 81 mg by mouth daily.    . calcium gluconate 500 MG tablet Take 1 tablet by mouth daily.    . cholecalciferol (VITAMIN D3) 25 MCG (1000 UT) tablet Take 1,000 Units by mouth daily.    11/24/19 glucose blood (ONETOUCH VERIO) test strip 1 each by Other route 2 (two) times a day. 200 each 2  . hydrALAZINE (APRESOLINE) 50 MG tablet Take 1 tablet (50 mg total) by mouth 3 (three) times daily. 360 tablet 1  . Lancets (ONETOUCH ULTRASOFT) lancets Use as directed to test blood sugar once daily E11.9 100 each 12  . losartan (COZAAR) 100 MG tablet TAKE 1 TABLET (100 MG TOTAL) BY MOUTH DAILY. MUST SCHEDULE PHYSICAL 90 tablet 1  . metFORMIN (GLUCOPHAGE) 500 MG tablet TAKE 1 TABLET BY MOUTH EVERY DAY WITH BREAKFAST 90 tablet 0  . omeprazole (PRILOSEC) 20 MG capsule Take 1 capsule (20 mg total) by mouth daily. 90 capsule 1  . predniSONE (DELTASONE) 10 MG tablet Take 3 tabs on days 1-2, take 2 tabs on days 3-4, take 1 tab on days 5-6 12 tablet 0  . simvastatin (ZOCOR) 40 MG tablet TAKE 1  TABLET BY MOUTH DAILY AT 6 PM. 90 tablet 3   No current facility-administered medications on file prior to visit.    Allergies  Allergen Reactions  . Codeine Nausea Only     Assessment/Plan:  1. Hypertension - Patient BP today 172/62 which is above goal of <130/80.  Since patient can not tolerate three times daily hydralazine, and is also intolerant of amlodipine and HCTZ, recommend starting spironolactone 12.5mg  once daily and check BMP in 1 week.  Patient voiced understanding.  Laural Golden, PharmD, BCACP, CDCES [facility] [address] [place], [place] [address] Phone: [phone]; Fax: [phone] 12/24/2019 4:45 PM

## 2019-12-31 ENCOUNTER — Telehealth: Payer: Self-pay | Admitting: Pharmacist

## 2019-12-31 ENCOUNTER — Other Ambulatory Visit: Payer: Self-pay

## 2019-12-31 ENCOUNTER — Other Ambulatory Visit: Payer: Medicare PPO | Admitting: *Deleted

## 2019-12-31 DIAGNOSIS — I10 Essential (primary) hypertension: Secondary | ICD-10-CM

## 2019-12-31 LAB — BASIC METABOLIC PANEL
BUN/Creatinine Ratio: 9 — ABNORMAL LOW (ref 12–28)
BUN: 11 mg/dL (ref 8–27)
CO2: 23 mmol/L (ref 20–29)
Calcium: 9.3 mg/dL (ref 8.7–10.3)
Chloride: 98 mmol/L (ref 96–106)
Creatinine, Ser: 1.21 mg/dL — ABNORMAL HIGH (ref 0.57–1.00)
GFR calc Af Amer: 52 mL/min/{1.73_m2} — ABNORMAL LOW (ref >59)
GFR calc non Af Amer: 45 mL/min/{1.73_m2} — ABNORMAL LOW (ref >59)
Glucose: 102 mg/dL — ABNORMAL HIGH (ref 65–99)
Potassium: 4.8 mmol/L (ref 3.5–5.2)
Sodium: 135 mmol/L (ref 134–144)

## 2019-12-31 NOTE — Telephone Encounter (Signed)
Attempted to call patient regarding lab results.  No answer, LMOM

## 2020-01-01 MED ORDER — HYDRALAZINE HCL 50 MG PO TABS: 50.0000 mg | ORAL_TABLET | Freq: Two times a day (BID) | ORAL | 11 refills | Status: DC

## 2020-01-01 NOTE — Telephone Encounter (Signed)
Nahdia is returning Christopher's call.

## 2020-01-01 NOTE — Telephone Encounter (Signed)
Returned call to pt. Advised her that labs are stable but that SCr bumped just slightly (still consistent with previous trends). She reports home BP still fluctuating but overall improved. Readings over the past few days as follows: 143/66, 149/67, 130/68, 126/67, 157/66.  She wishes to continue on current meds for another week or so since her readings have already improved in the past week since starting spironolactone. Encouraged pt to increase walking to 30 minutes 5 days a week which she feels is an achievable goal. She will continue to monitor BP and bring readings to next visit which I have scheduled in 1 week. Will recheck BMET that day in case dose of spironolactone needs to be further increased (pt also concerned about kidney function as a relative is on dialysis). Also updated med list since pt takes hydralazine BID not TID as medication list currently states.

## 2020-01-07 ENCOUNTER — Encounter: Payer: Self-pay | Admitting: Gastroenterology

## 2020-01-07 NOTE — Progress Notes (Signed)
Patient ID: Vicki Perry                 DOB: 11/25/1950                      MRN: [id number]     HPI: Vicki Perry is a 69 y.o. female referred by Dr. Bjorn Pippin to HTN clinic. PMH is significant for DM, HLD, and HTN.  Patient recently seen by Dr Bjorn Pippin on 9/22 for evaluation of chest pain.  BP was very elevated at 206/84 although patient reported home readings were in the 140s/150s.  Due to intolerance to HCTZ and amlodipine, hydralazine was increased to 50mg  TID.  Remained on losartan 100mg  daily. The patient was last seen at our clinic 12/24/19 at which time she reported an intolerance to hydralazine 50mg  TID ( "made her feet tingle"). As her in-office BP was 172/62, she was started on spironolactone 12.5mg  daily. 7 day BMP indicated that Scr increases slightly, but this was consistent with previous trends.   The patient reports BP has been up and down since last visit, but feels blood pressure has been doing better. Patient is having lightheadedness with tachycardia in the morning and in the afternoon that seems to occur for 45 minutes after she takes her hydralazine. She states that her BP is high and then drops rapidly (remembers 170s before dropping). Of note, she has been taking her home blood pressure before taking her AM and PM BP medications. Denies falls. Endorses recent headaches attributed to sinus infection that she treated with coricidine & tylenol. When asked, the patient states that her sleeping has been worse recently. She confirmed that she has been drinking much more water recently (8 x 8 ounces of water daily) & was counseled to stop drinking 2hrs prior to bed. The patient also requested more information about diets that may improve her BP (info provided to her documented in A&P).  The patient was asked about her previous intolerance to amlodipine. She reports that she had painful LEE/swelling that occurred after starting amlodipine 10mg  (no titration occurred). The patient  confirmed that she would be willing to try taking amlodipine at a lower dose once it was explained that LEE is typically dose-dependent.   Current HTN meds: losartan 100mg  daily, hydralazine 50 mg BID, Spironolactone 12.5mg  daily  (7-8AM, 8PM) with food Previously tried: HCTZ (hyponatremia), amlodipine (edema), hydralazine 10mg  and 25mg , lisinopril 20mg , lisinopril 40mg  BP goal: <130/80  Family History: HTN and DM (mother), HTN (father)  Diet:  - adheres to low salt diet per patient  - has increased water intake since last visit - has cut back on coffee (1/2 cup) - has not had caffeine beyond   Exercise:  - 1 mile/day (this is a recent increase)   - walks outside  - gets wood for fireplace - clean windows, refrigerator   Home BP readings:  123/61 - 146/62  141/63- 147/61 154/67 - 149/64 136/56 - 147/53 134/54 - 164/56 144/67 - 152/64 150/71 - 147/64 118/63 - 134/59 136/58 - 144/60 124/53 - 166/56 145/58 - 159/65  * lowest 2-3 hrs after taking medications - takes BP readings before she takes her medications   Wt Readings from Last 3 Encounters:  12/10/19 152 lb 6.4 oz (69.1 kg)  09/23/19 151 lb (68.5 kg)  09/05/19 152 lb 9.6 oz (69.2 kg)   BP Readings from Last 3 Encounters:  12/24/19 (!) 172/62  12/10/19 (!) 206/84  09/23/19 (!) 144/66  Pulse Readings from Last 3 Encounters:  12/24/19 82  12/10/19 79  09/23/19 82    Labs: 12/31/19: K 4.8, Scr 1.21, Na 135  Past Medical History:  Diagnosis Date  . Anemia   . Diabetes mellitus   . High cholesterol   . Hypertension     Current Outpatient Medications on File Prior to Visit  Medication Sig Dispense Refill  . aspirin EC 81 MG tablet Take 81 mg by mouth daily.    . calcium gluconate 500 MG tablet Take 1 tablet by mouth daily.    . cholecalciferol (VITAMIN D3) 25 MCG (1000 UT) tablet Take 1,000 Units by mouth daily.    Marland Kitchen glucose blood (ONETOUCH VERIO) test strip 1 each by Other route 2 (two) times a day.  200 each 2  . hydrALAZINE (APRESOLINE) 50 MG tablet Take 1 tablet (50 mg total) by mouth in the morning and at bedtime. 60 tablet 11  . Lancets (ONETOUCH ULTRASOFT) lancets Use as directed to test blood sugar once daily E11.9 100 each 12  . losartan (COZAAR) 100 MG tablet TAKE 1 TABLET (100 MG TOTAL) BY MOUTH DAILY. MUST SCHEDULE PHYSICAL 90 tablet 1  . metFORMIN (GLUCOPHAGE) 500 MG tablet TAKE 1 TABLET BY MOUTH EVERY DAY WITH BREAKFAST 90 tablet 0  . omeprazole (PRILOSEC) 20 MG capsule Take 1 capsule (20 mg total) by mouth daily. 90 capsule 1  . simvastatin (ZOCOR) 40 MG tablet TAKE 1 TABLET BY MOUTH DAILY AT 6 PM. 90 tablet 3  . spironolactone (ALDACTONE) 25 MG tablet Take 0.5 tablets (12.5 mg total) by mouth daily. 15 tablet 0   No current facility-administered medications on file prior to visit.    Allergies  Allergen Reactions  . Codeine Nausea Only    Vitals obtained today: BP 150/68 HR 69 SPO2 99%   Assessment/Plan:  1. Hypertension -  Patient's BP remains elevated today (>130/80). She continues to express symptoms of dizziness and lightheadedness after she takes hydralazine. Further, her BP seems to spike before she takes her medications, indicating that hydralazine BID dosing may be contributing to her BP fluctuations. As she reports previously tolerating hydralazine dose at 25mg , will decrease hydralazine dose to 25mg  BID with hopes to discontinue the medication in the future.   As the patient's blood pressure is continuously elevated, she will need additional medication for blood pressure control, especially given the hydralazine decrease. Given her history of LEE with a new start of amlodipine at 10mg  and given that this side effect is typically dose-dependent, will elect to initiate amlodipine at 2.5mg  today with close monitoring for repeat LEE. Given slight Scr & K increase after initiation of spironolactone, will defer future titration of this medication today. Will obtain  BMP today to confirm stable Scr & K.  The patient was provided information regarding the DASH diet and the Mediterranean diet as well as the link to the ADA website:  for more information about diet and HTN.   Will follow-up with patient November 5, 10:30 to assess tolerance of medication changes made today and need for medication titration.  Thank you,  , Pharmacy Student Class of [address] [place], Colin Broach.D, BCPS, CPP [facility]  [address] [place], [place] [address]  Phone: [phone]; Fax: [phone]

## 2020-01-08 ENCOUNTER — Other Ambulatory Visit: Payer: Self-pay

## 2020-01-08 ENCOUNTER — Ambulatory Visit (INDEPENDENT_AMBULATORY_CARE_PROVIDER_SITE_OTHER): Payer: Medicare PPO | Admitting: Pharmacist

## 2020-01-08 VITALS — BP 150/68 | HR 69

## 2020-01-08 DIAGNOSIS — I10 Essential (primary) hypertension: Secondary | ICD-10-CM

## 2020-01-08 LAB — BASIC METABOLIC PANEL
BUN/Creatinine Ratio: 10 — ABNORMAL LOW (ref 12–28)
BUN: 11 mg/dL (ref 8–27)
CO2: 23 mmol/L (ref 20–29)
Calcium: 8.9 mg/dL (ref 8.7–10.3)
Chloride: 101 mmol/L (ref 96–106)
Creatinine, Ser: 1.09 mg/dL — ABNORMAL HIGH (ref 0.57–1.00)
GFR calc Af Amer: 59 mL/min/{1.73_m2} — ABNORMAL LOW (ref >59)
GFR calc non Af Amer: 51 mL/min/{1.73_m2} — ABNORMAL LOW (ref >59)
Glucose: 109 mg/dL — ABNORMAL HIGH (ref 65–99)
Potassium: 4.5 mmol/L (ref 3.5–5.2)
Sodium: 135 mmol/L (ref 134–144)

## 2020-01-08 MED ORDER — AMLODIPINE BESYLATE 2.5 MG PO TABS: 2.5000 mg | ORAL_TABLET | Freq: Every day | ORAL | 3 refills | Status: DC

## 2020-01-08 NOTE — Patient Instructions (Signed)
Thank you for seeing [place] today!  Your blood pressure is still elevated, but has come down quite a bit. We would like to make a few medication changes today.  In the morning, take the following: - Losartan 100 mg daily (1 tablet) - Hydralazine 25 mg (1/2 tablet) - Spironolactone 12.5mg  (1/2 tablet)  At night: - amlodipine 2.5mg  (1 tablet) - Hydralazine 25 mg (1/2 tablet)   Great job working on [employer]. Some great diet plans to follow for your heart and your blood pressure are the DASH diet and the mediterranean diet.  Here is a great website for some recipe ideas: [email or web address].biz/.  We will get updated labs today to check to make sure your kidneys are doing well and that your potassium levels are good. We would like to scheduled a follow-up visit with you on November 5th at 10:30.  Thank you! Abby

## 2020-01-09 ENCOUNTER — Telehealth: Payer: Self-pay

## 2020-01-09 NOTE — Telephone Encounter (Signed)
Spoke to the patient and confirmed that her lab results look good. Explained to the patient that she should continue taking her medications as discussed during our visit. The patients also stated that she is feeling much better on the lower dose of hydralazine, but had not yet taken her blood pressure since decreasing the dose. Requested the patient call if swelling develops with amlodipine 2.5mg .

## 2020-01-22 NOTE — Progress Notes (Deleted)
Patient ID: Vicki Perry                 DOB: Nov 11, 1950                      MRN: [id number]     HPI: Vicki Perry is a 69 y.o. female referred by Dr. Bjorn Pippin to HTN clinic. PMH is significant for DM, HLD, and HTN.  At last visit with pharmacy clinic on 01/08/20, patient reported BP had been up and down but felt blood pressure was better. Patient was having lightheadedness with tachycardia in the morning and in the afternoon that seems to occur for 45 minutes after she taking her hydralazine.  She stated that her BP was high and then dropped rapidly. Denied falls. Patient reported that she had painful LEE/swelling that occurred after starting amlodipine 10mg  (no titration occurred). At visit on 01/08/20, amlodipine 2.5 mg daily was initiated. Patient was amendable to change after education of LEE with amlodipine being dose-dependent. Her hydralazine was also decreased to 25mg  BID.   Patient presents today in good spirits. She states that her blood pressure was running too high so she increased her hydralazine back up and started taking a mid day dose. She is now taking hydralazine 50 AM, 25mg  @ 2PM and 25PM @8 :30-9PM. She states that although she still gets palpitations and tingling for about 45 min after she takes the 50mg  dose, she is ok with it since her blood pressure is coming down. She has increased her walking as well. She is up to about 2 miles instead of 1 mile and walks about 4 days a week. She read up on the DASH and Mediterranean diet and is trying to loose some weight. Would like to be on less BP medications. Blood pressures at home are variable, some in the 130's and 120's but also some in the 150's.  Current HTN meds: losartan 100mg  daily, hydralazine 25 mg BID, Spironolactone 12.5mg  daily, amlodipine 2.5mg  daily    Previously tried: HCTZ 25 mg daily (hyponatremia), amlodipine 10 mg daily (edema), hydralazine 10mg  and 25mg  BID,hydralazine 50 mg TID (made her feet "tingle"),  lisinopril 20mg  & 40 mg daily   BP goal: <130/80  Family History: HTN and DM (mother), HTN (father)  Diet:  - adheres to low salt diet per patient  - has increased water intake since last visit - has cut back on coffee (1/2 cup) - has not had caffeine beyond   Exercise:  - 2 mile/day (this is a recent increase) ~ 30 min (4 days a week)  - walks outside  - gets wood for fireplace - clean windows, refrigerator   Home BP readings: 141/54, 141/57, 137/59 157/60, 147/58, 132/58, 149/60, 138/62, 131/63, 141/63, 148/60, 127/63, 140/65, 128/64, 122/66, 162/49, 135/60, 159/57, 145/63, 140/59   * lowest 2-3 hrs after taking medications - takes BP readings before she takes her medications   Wt Readings from Last 3 Encounters:  12/10/19 152 lb 6.4 oz (69.1 kg)  09/23/19 151 lb (68.5 kg)  09/05/19 152 lb 9.6 oz (69.2 kg)   BP Readings from Last 3 Encounters:  01/08/20 (!) 150/68  12/24/19 (!) 172/62  12/10/19 (!) 206/84   Pulse Readings from Last 3 Encounters:  01/08/20 69  12/24/19 82  12/10/19 79    Labs: 01/08/20: K 4.5, Scr 1.09, Na 135  Past Medical History:  Diagnosis Date  . Anemia   . Diabetes mellitus   . High cholesterol   .  Hypertension     Current Outpatient Medications on File Prior to Visit  Medication Sig Dispense Refill  . amLODipine (NORVASC) 2.5 MG tablet Take 1 tablet (2.5 mg total) by mouth daily. 90 tablet 3  . aspirin EC 81 MG tablet Take 81 mg by mouth daily. (Patient not taking: Reported on 01/08/2020)    . calcium gluconate 500 MG tablet Take 1 tablet by mouth daily.    . cholecalciferol (VITAMIN D3) 25 MCG (1000 UT) tablet Take 1,000 Units by mouth daily.    Marland Kitchen glucose blood (ONETOUCH VERIO) test strip 1 each by Other route 2 (two) times a day. 200 each 2  . hydrALAZINE (APRESOLINE) 50 MG tablet Take 0.5 tablets (25 mg total) by mouth in the morning and at bedtime. 60 tablet 11  . Lancets (ONETOUCH ULTRASOFT) lancets Use as directed to test  blood sugar once daily E11.9 100 each 12  . losartan (COZAAR) 100 MG tablet TAKE 1 TABLET (100 MG TOTAL) BY MOUTH DAILY. MUST SCHEDULE PHYSICAL 90 tablet 1  . metFORMIN (GLUCOPHAGE) 500 MG tablet TAKE 1 TABLET BY MOUTH EVERY DAY WITH BREAKFAST 90 tablet 0  . omeprazole (PRILOSEC) 20 MG capsule Take 1 capsule (20 mg total) by mouth daily. 90 capsule 1  . simvastatin (ZOCOR) 40 MG tablet TAKE 1 TABLET BY MOUTH DAILY AT 6 PM. 90 tablet 3  . spironolactone (ALDACTONE) 25 MG tablet Take 0.5 tablets (12.5 mg total) by mouth daily. 15 tablet 0   No current facility-administered medications on file prior to visit.    Allergies  Allergen Reactions  . Codeine Nausea Only    Vitals obtained today:    Assessment/Plan:  1. Hypertension -    Olene Floss, Pharm.D, BCPS, CPP [facility]  [address] [place], [place] [address]  Phone: [phone]; Fax: [phone]

## 2020-01-23 ENCOUNTER — Other Ambulatory Visit: Payer: Self-pay

## 2020-01-23 ENCOUNTER — Ambulatory Visit (INDEPENDENT_AMBULATORY_CARE_PROVIDER_SITE_OTHER): Payer: Medicare PPO | Admitting: Pharmacist

## 2020-01-23 ENCOUNTER — Telehealth: Payer: Self-pay

## 2020-01-23 VITALS — BP 142/50 | HR 71

## 2020-01-23 DIAGNOSIS — I10 Essential (primary) hypertension: Secondary | ICD-10-CM | POA: Diagnosis not present

## 2020-01-23 MED ORDER — SPIRONOLACTONE 25 MG PO TABS: 25.0000 mg | ORAL_TABLET | Freq: Every day | ORAL | 3 refills | Status: DC

## 2020-01-23 NOTE — Patient Instructions (Addendum)
It was so nice to see you today!  Please increase your spironolactone to 25mg  (1 tablet) by mouth daily  Continue taking hydralazine 50mg  in the AM and 25mg  in the afternoon and evening  Continue losartan 100mg  daily and amlodipine 2.5mg  daily  Continue checking your blood pressure at home  Call at [phone] with any questions or concerns  I will send a referral for the [facility] PREP class

## 2020-01-23 NOTE — Progress Notes (Signed)
Patient ID: Vicki Perry                 DOB: [birth year]                      MRN: [id number]     HPI: Vicki Perry is a 69 y.o. female referred by Dr. Bjorn Pippin to HTN clinic. PMH is significant for DM, HLD, and HTN.  At last visit with pharmacy clinic on 01/08/20, patient reported BP had been up and down but felt blood pressure was better. Patient was having lightheadedness with tachycardia in the morning and in the afternoon that seems to occur for 45 minutes after she taking her hydralazine.  She stated that her BP was high and then dropped rapidly. Denied falls. Patient reported that she had painful LEE/swelling that occurred after starting amlodipine 10mg  (no titration occurred). At visit on 01/08/20, amlodipine 2.5 mg daily was initiated. Patient was amendable to change after education of LEE with amlodipine being dose-dependent. Her hydralazine was also decreased to 25mg  BID.   Patient presents today in good spirits. She states that her blood pressure was running too high so she increased her hydralazine back up and started taking a mid day dose. She is now taking hydralazine 50 AM, 25mg  @ 2PM and 25PM @8 :30-9PM. She states that although she still gets palpitations and tingling for about 45 min after she takes the 50mg  dose, she is ok with it since her blood pressure is coming down. She has increased her walking as well. She is up to about 2 miles instead of 1 mile and walks about 4 days a week. She read up on the DASH and Mediterranean diet and is trying to loose some weight. Would like to be on less BP medications. Blood pressures at home are variable, some in the 130's and 120's but also some in the 150's.  Current HTN meds: losartan 100mg  daily, hydralazine 25 mg BID, Spironolactone 12.5mg  daily, amlodipine 2.5mg  daily    Previously tried: HCTZ 25 mg daily (hyponatremia), amlodipine 10 mg daily (edema), hydralazine 10mg  and 25mg  BID,hydralazine 50 mg TID (made her feet "tingle"),  lisinopril 20mg  & 40 mg daily   BP goal: <130/80  Family History: HTN and DM (mother), HTN (father)  Diet:  - adheres to low salt diet per patient  - has increased water intake since last visit - has cut back on coffee (1/2 cup) - has not had caffeine beyond   Exercise:  - 2 mile/day (this is a recent increase) ~ 30 min (4 days a week)  - walks outside  - gets wood for fireplace - clean windows, refrigerator   Home BP readings: 141/54, 141/57, 137/59 157/60, 147/58, 132/58, 149/60, 138/62, 131/63, 141/63, 148/60, 127/63, 140/65, 128/64, 122/66, 162/49, 135/60, 159/57, 145/63, 140/59   * lowest 2-3 hrs after taking medications - takes BP readings before she takes her medications   Wt Readings from Last 3 Encounters:  12/10/19 152 lb 6.4 oz (69.1 kg)  09/23/19 151 lb (68.5 kg)  09/05/19 152 lb 9.6 oz (69.2 kg)   BP Readings from Last 3 Encounters:  01/08/20 (!) 150/68  12/24/19 (!) 172/62  12/10/19 (!) 206/84   Pulse Readings from Last 3 Encounters:  01/08/20 69  12/24/19 82  12/10/19 79    Labs: 01/08/20: K 4.5, Scr 1.09, Na 135  Past Medical History:  Diagnosis Date  . Anemia   . Diabetes mellitus   . High cholesterol   .  Hypertension     Current Outpatient Medications on File Prior to Visit  Medication Sig Dispense Refill  . amLODipine (NORVASC) 2.5 MG tablet Take 1 tablet (2.5 mg total) by mouth daily. 90 tablet 3  . aspirin EC 81 MG tablet Take 81 mg by mouth daily. (Patient not taking: Reported on 01/08/2020)    . calcium gluconate 500 MG tablet Take 1 tablet by mouth daily.    . cholecalciferol (VITAMIN D3) 25 MCG (1000 UT) tablet Take 1,000 Units by mouth daily.    Marland Kitchen glucose blood (ONETOUCH VERIO) test strip 1 each by Other route 2 (two) times a day. 200 each 2  . hydrALAZINE (APRESOLINE) 50 MG tablet Take 0.5 tablets (25 mg total) by mouth in the morning and at bedtime. 60 tablet 11  . Lancets (ONETOUCH ULTRASOFT) lancets Use as directed to test  blood sugar once daily E11.9 100 each 12  . losartan (COZAAR) 100 MG tablet TAKE 1 TABLET (100 MG TOTAL) BY MOUTH DAILY. MUST SCHEDULE PHYSICAL 90 tablet 1  . metFORMIN (GLUCOPHAGE) 500 MG tablet TAKE 1 TABLET BY MOUTH EVERY DAY WITH BREAKFAST 90 tablet 0  . omeprazole (PRILOSEC) 20 MG capsule Take 1 capsule (20 mg total) by mouth daily. 90 capsule 1  . simvastatin (ZOCOR) 40 MG tablet TAKE 1 TABLET BY MOUTH DAILY AT 6 PM. 90 tablet 3  . spironolactone (ALDACTONE) 25 MG tablet Take 0.5 tablets (12.5 mg total) by mouth daily. 15 tablet 0   No current facility-administered medications on file prior to visit.    Allergies  Allergen Reactions  . Codeine Nausea Only    Vitals obtained today:    Assessment/Plan:  1. Hypertension - Blood pressure is above goal of <130/80 in clinic today. Will increase spironolactone to 25mg  daily. New Rx sent to pharmacy. Since pt is willing and wants to continue hydralazine higher dose, we will continue hydralazine 50mg  q AM, 25mg  @ 2Pm and 25mg  at night. Continue losartan 100mg  daily and amlodipine 2.5mg  daily. Follow up in clinic in [redacted] weeks along with labs. We discussed both the [facility] prep class and the healthy weight loss clinic. Patient requested to be referred to the [facility] prep class. Referral sent to [facility]   , Pharm.D, BCPS, CPP [facility]  [address], [place], [facility]  Phone: [phone]; Fax: [phone]

## 2020-01-23 NOTE — Telephone Encounter (Signed)
Call placed referral to PREP Explained program to patient Interested in program and can start on 11/15 (M/W 1p-215pm) At [facility] Intake scheduled for 11/12 at 11am.

## 2020-01-30 NOTE — Progress Notes (Signed)
[facility] [employer] PRE[facility] Progress Report   Patient Details  Name: Vicki Perry MRN: [id number] Date of Birth: February 28, 1951 Age: 69 y.o. PCP: Lorre Munroe, NP  Vitals:   01/30/20 1134  BP: (!) 142/60  Pulse: 81  SpO2: 98%  Weight: 150 lb 6.4 oz (68.2 kg)  Height: 5\' 1"  (1.549 m)      [facility] [employer] Eval - 01/30/20 1100      Referral    Referring Provider HTN clinic    Reason for referral Hypertension    Program Start Date 02/02/20   MW 1p-215p x 12 wks      Measurement   Waist Circumference 36 inches    Hip Circumference 36 inches    Body fat 40.6 percent      Information for Trainer   Goals 10 lb wt loss, restart exercise, manage  BP    Current Exercise previously walked 3x wk x 30 min    Orthopedic Concerns right foot sx, slipped disc in low back    Pertinent Medical History HTN, DM2    Current Barriers none    Restrictions/Precautions Diabetic snack before exercise    Medications that affect exercise Medication causing dizziness/drowsiness      Timed Up and Go (TUGS)   Timed Up and Go Low risk <9 seconds      Mobility and Daily Activities   I find it easy to walk up or down two or more flights of stairs. 3    I have no trouble taking out the trash. 4    I do housework such as vacuuming and dusting on my own without difficulty. 4    I can easily lift a gallon of milk (8lbs). 4    I can easily walk a mile. 4    I have no trouble reaching into high cupboards or reaching down to pick up something from the floor. 4    I do not have trouble doing out-door work such as 02/04/20, raking leaves, or gardening. 4      Mobility and Daily Activities   I feel younger than my age. 3    I feel independent. 4    I feel energetic. 4    I live an active life.  4    I feel strong. 4    I feel healthy. 4    I feel active as other people my age. 4      How fit and strong are you.   Fit and Strong Total Score 54          Past Medical History:  Diagnosis Date  . Anemia     . Diabetes mellitus   . High cholesterol   . Hypertension    Past Surgical History:  Procedure Laterality Date  . CATARACT EXTRACTION, BILATERAL Bilateral 03/22/2018  . CESAREAN SECTION    . FOOT SURGERY Right    Bone Spur removal  . TONSILLECTOMY    . WISDOM TOOTH EXTRACTION     Social History   Tobacco Use  Smoking Status Never Smoker  Smokeless Tobacco Never Used      05/21/2018 01/30/2020, 11:39 AM

## 2020-02-05 ENCOUNTER — Ambulatory Visit: Payer: Medicare PPO

## 2020-02-05 NOTE — Progress Notes (Signed)
Patient ID: [name]                 DOB: [birth year]                      MRN: [id number]     HPI: Vicki Perry is a 69 y.o. female referred by Vicki Perry to HTN clinic. PMH is significant for DM, HLD, and HTN.  At visit with pharmacy clinic on 01/08/20, patient reported BP had been up and down but felt blood pressure was better. Patient was having lightheadedness with tachycardia in the morning and in the afternoon that seemed to occur for 45 minutes after taking her hydralazine. She stated that her BP was high and then dropped rapidly. Denied falls. Patient reported that she had painful LEE/swelling that occurred after starting amlodipine 10mg  (no titration occurred). Amlodipine 2.5 mg daily was initiated at that visit. Her hydralazine was also decreased to 25mg  BID.   At last visit with pharmacy clinic on 01/23/20, she stated that her blood pressure was running too high so she increased her hydralazine back up and started taking a mid day dose. She is now taking hydralazine 50 AM, 25mg  @ 2PM and 25 mg @8 :30-9PM. She stated that although she was still getting palpitations and tingling for about 45 min after she takes the 50mg  dose, she was ok with it since her blood pressure was coming down. She reported increasing her walking as well. She was up to about 2 miles instead of 1 mile and walks about 4 days a week. She read up on the DASH and Mediterranean diet and is trying to lose some weight. Would like to be on less BP medications. Blood pressures at home are variable, some in the 130's and 120's but also some in the 150's. Spironolactone was increased to 25 mg daily and patient was referred to [facility] prep class.  Patient presents today in good spirits. Patient is very pleased with her first 2 classes of her [employer] prep class. She reports improved overall feeling and recent BP readings since dietary modifications and spironolactone dose increase. She is tolerating medication well. Patient  reported dizziness when taking hydralazine AM dose at same time as amlodipine. She now separates by 2 hours and does not have dizziness. Patient measures BP TID, once before AM medications, once in afternoon and evening. Most home readings in the 120s or 130s systolic. BP in clinic was 148/58. Patient has brought home monitor to clinic in the past and her monitor reads 5 mmHg lower than BP on aneroid. Patient is invested in her care and motivated to make lifestyle changes including improving her diet and exercise to lower her blood pressure.   Current HTN meds: losartan 100mg  daily, hydralazine 50 mg QAM 25 mg @2pm  and at night, Spironolactone 25 mg daily, amlodipine 2.5mg  daily   68 Previously tried: HCTZ 25 mg daily (hyponatremia), amlodipine 10 mg daily (edema), hydralazine 10mg  and 25mg  BID,hydralazine 50 mg TID (made her feet "tingle"), lisinopril 20mg  & 40 mg daily   BP goal: <130/80  Family History: HTN and DM (mother), HTN (father)  Diet:  - adheres to low salt diet per patient  - has increased water intake since last visit - has cut back on coffee (1/2 cup) - has not had caffeine beyond  - cut down on meat intake, more vegetables (fresh)  - has decreased how much she drinks at lunch  - down to 6 cups of  water instead of 8  Exercise:  - 2 mile/day (this is a recent increase) ~ 30 min (4 days a week) - walks outside, has tried to be more consistent recently - gets wood for fireplace - clean windows, refrigerator  - [employer] prep class, 2 classes so far and really enjoys  Home BP readings:  143/58, 145/59, 135/65, 141/59, 131/56, 124/54, 126/57, 150/59, 124/63, 131/61, 124/56, 136/60, 120/59  122-162 SBP before spironolactone dose increase with most readings above 140 systolic  * lowest 2-3 hrs after taking medications, also takes before medications Bold indicated BP measurements before medications  Wt Readings from Last 3 Encounters:  12/10/19 152 lb 6.4 oz (69.1 kg)   09/23/19 151 lb (68.5 kg)  09/05/19 152 lb 9.6 oz (69.2 kg)   BP Readings from Last 3 Encounters:  01/08/20 (!) 150/68  12/24/19 (!) 172/62  12/10/19 (!) 206/84   Pulse Readings from Last 3 Encounters:  01/08/20 69  12/24/19 82  12/10/19 79    Labs: 01/08/20: K 4.5, Scr 1.09, Na 135  Past Medical History:  Diagnosis Date  . Anemia   . Diabetes mellitus   . High cholesterol   . Hypertension     Current Outpatient Medications on File Prior to Visit  Medication Sig Dispense Refill  . amLODipine (NORVASC) 2.5 MG tablet Take 1 tablet (2.5 mg total) by mouth daily. 90 tablet 3  . aspirin EC 81 MG tablet Take 81 mg by mouth daily. (Patient not taking: Reported on 01/08/2020)    . calcium gluconate 500 MG tablet Take 1 tablet by mouth daily.    . cholecalciferol (VITAMIN D3) 25 MCG (1000 UT) tablet Take 1,000 Units by mouth daily.    Marland Kitchen glucose blood (ONETOUCH VERIO) test strip 1 each by Other route 2 (two) times a day. 200 each 2  . hydrALAZINE (APRESOLINE) 50 MG tablet Take 0.5 tablets (25 mg total) by mouth in the morning and at bedtime. 60 tablet 11  . Lancets (ONETOUCH ULTRASOFT) lancets Use as directed to test blood sugar once daily E11.9 100 each 12  . losartan (COZAAR) 100 MG tablet TAKE 1 TABLET (100 MG TOTAL) BY MOUTH DAILY. MUST SCHEDULE PHYSICAL 90 tablet 1  . metFORMIN (GLUCOPHAGE) 500 MG tablet TAKE 1 TABLET BY MOUTH EVERY DAY WITH BREAKFAST 90 tablet 0  . omeprazole (PRILOSEC) 20 MG capsule Take 1 capsule (20 mg total) by mouth daily. 90 capsule 1  . simvastatin (ZOCOR) 40 MG tablet TAKE 1 TABLET BY MOUTH DAILY AT 6 PM. 90 tablet 3  . spironolactone (ALDACTONE) 25 MG tablet Take 0.5 tablets (12.5 mg total) by mouth daily. 15 tablet 0   No current facility-administered medications on file prior to visit.    Allergies  Allergen Reactions  . Codeine Nausea Only    Vitals obtained today: BP: 148/58  HR: 68   Assessment/Plan:   1. Hypertension - Blood  pressure is above goal of <130/80 in clinic today. 5 of 9 home BP readings taken after medications are at goal and have improved since recent lifestyle changes and spironolactone dose increase. BP measurement technique and medication adherence appears optimal. Considering patient has recently started her diet and exercise program with [employer] prep and is motivated to make lifestyle changes, will continue current medication regimen and evaluate BP in one month after allowing time for effect of exercise to be seen. Discussed trying to limit foods with a high glycemic index like dried fruits. Encouraged patient to take BP twice  daily to avoid stress over her numbers.   Repeat BMET today considering recent spironolactone increase. Scheduled appointment for 03/04/20 to evaluate effect of exercise on BP  Thank you,  Idamae Lusher  PharmD Candidate, Class of 2022   Olene Floss, Pharm.D, BCPS, CPP [facility]  [address] [place], [place] [address]  Phone: [phone]; Fax: [phone]

## 2020-02-06 ENCOUNTER — Ambulatory Visit (INDEPENDENT_AMBULATORY_CARE_PROVIDER_SITE_OTHER): Payer: Medicare PPO | Admitting: Pharmacist

## 2020-02-06 ENCOUNTER — Other Ambulatory Visit: Payer: Self-pay

## 2020-02-06 VITALS — BP 148/58 | HR 68

## 2020-02-06 DIAGNOSIS — I10 Essential (primary) hypertension: Secondary | ICD-10-CM

## 2020-02-06 NOTE — Patient Instructions (Addendum)
It was a pleasure seeing you today!  Continue your current medications: losartan 100 mg daily, hydralazine 50 mg in the morning and 25 mg in the afternoon and evening, and amlodipine 2.5 mg 2 hours after morning dose of hydralazine.   Continue to take your blood pressure twice per day about 2 hours after you take your medications.   Continue to exercise at the [facility] and walking.   Try to limit dried fruits as these can be higher in sugar. Apples and peaches have a low glycemic index and do not raise your blood pressure much.   We will see you in four weeks to look at your blood pressure.

## 2020-02-07 LAB — BASIC METABOLIC PANEL
BUN/Creatinine Ratio: 13 (ref 12–28)
BUN: 15 mg/dL (ref 8–27)
CO2: 24 mmol/L (ref 20–29)
Calcium: 9.2 mg/dL (ref 8.7–10.3)
Chloride: 100 mmol/L (ref 96–106)
Creatinine, Ser: 1.13 mg/dL — ABNORMAL HIGH (ref 0.57–1.00)
GFR calc Af Amer: 57 mL/min/{1.73_m2} — ABNORMAL LOW (ref >59)
GFR calc non Af Amer: 49 mL/min/{1.73_m2} — ABNORMAL LOW (ref >59)
Glucose: 99 mg/dL (ref 65–99)
Potassium: 4.5 mmol/L (ref 3.5–5.2)
Sodium: 134 mmol/L (ref 134–144)

## 2020-02-08 ENCOUNTER — Ambulatory Visit (HOSPITAL_COMMUNITY): Payer: Medicare PPO

## 2020-02-08 ENCOUNTER — Other Ambulatory Visit: Payer: Self-pay

## 2020-02-08 ENCOUNTER — Encounter (HOSPITAL_COMMUNITY): Payer: Self-pay

## 2020-02-08 ENCOUNTER — Ambulatory Visit (HOSPITAL_COMMUNITY)
Admission: EM | Admit: 2020-02-08 | Discharge: 2020-02-08 | Disposition: A | Discharge status: Home / Self Care | Payer: Medicare PPO | Attending: Family Medicine | Admitting: Family Medicine

## 2020-02-08 DIAGNOSIS — Z9109 Other allergy status, other than to drugs and biological substances: Secondary | ICD-10-CM

## 2020-02-08 DIAGNOSIS — Z20822 Contact with and (suspected) exposure to covid-19: Secondary | ICD-10-CM | POA: Insufficient documentation

## 2020-02-08 DIAGNOSIS — J019 Acute sinusitis, unspecified: Secondary | ICD-10-CM | POA: Insufficient documentation

## 2020-02-08 DIAGNOSIS — R059 Cough, unspecified: Secondary | ICD-10-CM | POA: Diagnosis not present

## 2020-02-08 DIAGNOSIS — Z8709 Personal history of other diseases of the respiratory system: Secondary | ICD-10-CM

## 2020-02-08 MED ORDER — CETIRIZINE HCL 10 MG PO TABS: 10.0000 mg | ORAL_TABLET | Freq: Every day | ORAL | 0 refills | Status: DC

## 2020-02-08 MED ORDER — BENZONATATE 100 MG PO CAPS
100.0000 mg | ORAL_CAPSULE | Freq: Three times a day (TID) | ORAL | 0 refills | Status: DC | PRN
Start: 2020-02-08 — End: 2020-12-15

## 2020-02-08 NOTE — ED Triage Notes (Signed)
Pt in with c/o cough that started over 1 month ago.  States it has been going on since she was dx with sinus infection 1 month ago.  Requesting covid testing due to neighbor testing positive yesterday. States she was only at the door.  Has been vaccinated

## 2020-02-08 NOTE — ED Provider Notes (Signed)
[facility]   MRN: [id number] DOB: Sep 19, 1950  Subjective:   Vicki Perry is a 69 y.o. female presenting for 1 month hx of intermittent mild cough. Denies fever, sinus pain, chest pain, shob, body aches. Her primary concern is that she was just around a COVID patient this past week. Underwent a course of amoxicillin a month ago with good resolution of her sinus infection. Has a hx of environmental allergies, asthma. Has not been taking her Zyrtec. Has not needed an albuterol inhaler for about 2 years.  Has a history of hypertension but no history of heart disease, MI or heart failure.  She is working closely with her cardiologist on managing her blood pressure.  Has a follow-up appointment in 3 weeks.  No current facility-administered medications for this encounter.  Current Outpatient Medications:    amLODipine (NORVASC) 2.5 MG tablet, Take 1 tablet (2.5 mg total) by mouth daily., Disp: 90 tablet, Rfl: 3   aspirin EC 81 MG tablet, Take 81 mg by mouth daily. (Patient not taking: Reported on 01/08/2020), Disp: , Rfl:    calcium gluconate 500 MG tablet, Take 1 tablet by mouth daily., Disp: , Rfl:    cholecalciferol (VITAMIN D3) 25 MCG (1000 UT) tablet, Take 1,000 Units by mouth daily., Disp: , Rfl:    glucose blood (ONETOUCH VERIO) test strip, 1 each by Other route 2 (two) times a day., Disp: 200 each, Rfl: 2   hydrALAZINE (APRESOLINE) 50 MG tablet, Take 50mg  in the AM, 25mg  in the afternoon and 25mg  at night, Disp: 270 tablet, Rfl: 3   Lancets (ONETOUCH ULTRASOFT) lancets, Use as directed to test blood sugar once daily E11.9, Disp: 100 each, Rfl: 12   losartan (COZAAR) 100 MG tablet, TAKE 1 TABLET (100 MG TOTAL) BY MOUTH DAILY. MUST SCHEDULE PHYSICAL, Disp: 90 tablet, Rfl: 1   metFORMIN (GLUCOPHAGE) 500 MG tablet, TAKE 1 TABLET BY MOUTH EVERY DAY WITH BREAKFAST, Disp: 90 tablet, Rfl: 0   omeprazole (PRILOSEC) 20 MG capsule, Take 1 capsule (20 mg total) by mouth  daily., Disp: 90 capsule, Rfl: 1   simvastatin (ZOCOR) 40 MG tablet, TAKE 1 TABLET BY MOUTH DAILY AT 6 PM., Disp: 90 tablet, Rfl: 3   spironolactone (ALDACTONE) 25 MG tablet, Take 1 tablet (25 mg total) by mouth daily., Disp: 90 tablet, Rfl: 3   Allergies  Allergen Reactions   Codeine Nausea Only    Past Medical History:  Diagnosis Date   Anemia    Diabetes mellitus    High cholesterol    Hypertension      Past Surgical History:  Procedure Laterality Date   CATARACT EXTRACTION, BILATERAL Bilateral 03/22/2018   CESAREAN SECTION     FOOT SURGERY Right    Bone Spur removal   TONSILLECTOMY     WISDOM TOOTH EXTRACTION      Family History  Problem Relation Age of Onset   Diabetes Mother    Hypertension Mother    Cancer Mother 58       Cervical   Hypertension Father    Diabetes Maternal Grandmother    Hypertension Sister    Diabetes Sister    Hypertension Brother    Heart disease Neg Hx    Stroke Neg Hx     Social History   Tobacco Use   Smoking status: Never Smoker   Smokeless tobacco: Never Used  Substance Use Topics   Alcohol use: No   Drug use: No    ROS  Objective:   Vitals: BP (!) 178/60 (BP Location: Right Arm)    Pulse 83    Temp 97.9 F (36.6 C) (Oral)    Resp 19    SpO2 99%   Physical Exam Constitutional:      General: She is not in acute distress.    Appearance: Normal appearance. She is well-developed. She is not ill-appearing, toxic-appearing or diaphoretic.  HENT:     Head: Normocephalic and atraumatic.     Nose: Nose normal.     Mouth/Throat:     Mouth: Mucous membranes are moist.  Eyes:     General: No scleral icterus.       Right eye: No discharge.        Left eye: No discharge.     Extraocular Movements: Extraocular movements intact.     Conjunctiva/sclera: Conjunctivae normal.     Pupils: Pupils are equal, round, and reactive to light.  Cardiovascular:     Rate and Rhythm: Normal rate and regular  rhythm.     Pulses: Normal pulses.     Heart sounds: Normal heart sounds. No murmur heard.  No friction rub. No gallop.   Pulmonary:     Effort: Pulmonary effort is normal. No respiratory distress.     Breath sounds: Normal breath sounds. No stridor. No wheezing, rhonchi or rales.  Skin:    General: Skin is warm and dry.     Findings: No rash.  Neurological:     Mental Status: She is alert and oriented to person, place, and time.  Psychiatric:        Mood and Affect: Mood normal.        Behavior: Behavior normal.        Thought Content: Thought content normal.        Judgment: Judgment normal.      Assessment and Plan :   PDMP not reviewed this encounter.  1. Cough   2. Subacute sinusitis, unspecified location   3. History of asthma   4. Environmental allergies   5. Exposure to COVID-19 virus     Suspect patient has residual cough from her sinus infection.  She also has a history of allergies and asthma.  Lung exam is very reassuring.  Offered patient chest x-ray but she does not have any concern for having pneumonia or cardiopulmonary problem.  Recommended she have close follow-up with her cardiologist regarding her uncontrolled hypertension.  Restart Zyrtec, use supportive medication including Tessalon Perles. COVID 19 testing pending. Counseled patient on potential for adverse effects with medications prescribed/recommended today, ER and return-to-clinic precautions discussed, patient verbalized understanding.    Wallis Bamberg, PA-C 02/08/20 1754

## 2020-02-09 ENCOUNTER — Encounter: Payer: Self-pay | Admitting: *Deleted

## 2020-02-09 ENCOUNTER — Telehealth: Payer: Self-pay

## 2020-02-09 LAB — SARS CORONAVIRUS 2 (TAT 6-24 HRS): SARS Coronavirus 2: NEGATIVE

## 2020-02-09 NOTE — Telephone Encounter (Signed)
Called patient to inform them that BMET came back steady with Scr at baseline. Patient was happy to receive good news this morning.

## 2020-02-09 NOTE — Telephone Encounter (Signed)
Continue losartan 100mg  daily, hydralazine 50 mg QAM 25 mg @2pm  and at night,Spironolactone 25 mg daily, amlodipine 2.5mg  daily

## 2020-02-16 NOTE — Progress Notes (Signed)
[facility] PREP Weekly Session   Patient Details  Name: Vicki Perry MRN: [id number] Date of Birth: 02-Nov-1950 Age: 69 y.o. PCP: Lorre Munroe, NP  Vitals:   02/16/20 1604  Weight: 149 lb (67.6 kg)     [facility] [employer] Weekly seesion - 02/16/20 1600      Weekly Session   Topic Discussed Healthy eating tips    Minutes exercised this week 105 minutes    Classes attended to date 4          Fun things since last meeting: walking and stretched legs Grateful for: family, new friends at the [facility] Nutrition celebration: less dessert Barriers/struggles: consistence with blood pressure.   Bonnye Fava 02/16/2020, 4:05 PM

## 2020-02-23 NOTE — Progress Notes (Signed)
[facility] PREP Weekly Session   Patient Details  Name: Vicki Perry MRN: [id number] Date of Birth: Aug 11, 1950 Age: 69 y.o. PCP: Lorre Munroe, NP  There were no vitals filed for this visit.   [facility] [employer] Weekly seesion - 02/23/20 1600      Weekly Session   Topic Discussed Health habits;Water    Minutes exercised this week 120 minutes    Classes attended to date 6         WT 148 Fun things since last class: completed Christmas Decorations Grateful for: better numbers for BP and diabetes Nutrition celebration: ate more salads with [place] dressing  Barriers/struggles: desserts this weekend  Bonnye Fava 02/23/2020, 4:03 PM

## 2020-02-25 ENCOUNTER — Other Ambulatory Visit: Payer: Self-pay | Admitting: Internal Medicine

## 2020-02-27 ENCOUNTER — Other Ambulatory Visit: Payer: Self-pay | Admitting: Internal Medicine

## 2020-02-27 DIAGNOSIS — Z1231 Encounter for screening mammogram for malignant neoplasm of breast: Secondary | ICD-10-CM

## 2020-03-01 NOTE — Progress Notes (Signed)
[facility] PREP Weekly Session   Patient Details  Name: Vicki Perry MRN: [id number] Date of Birth: Jun 19, 1950 Age: 69 y.o. PCP: Lorre Munroe, NP  Vitals:   03/01/20 1619  Weight: 149 lb (67.6 kg)     [facility] [employer] Weekly seesion - 03/01/20 1600      Weekly Session   Topic Discussed Restaurant Eating   Salt demo   Minutes exercised this week 120 minutes    Classes attended to date 8          Fun things since last meeting: shopped a little for family Grateful for: family Nutrition celebration: less sweets (cookies/cakes) Barriers/struggles: saying no to spice foods   Bonnye Fava 03/01/2020, 4:20 PM

## 2020-03-04 ENCOUNTER — Ambulatory Visit: Payer: Medicare PPO | Attending: Internal Medicine

## 2020-03-04 ENCOUNTER — Ambulatory Visit (INDEPENDENT_AMBULATORY_CARE_PROVIDER_SITE_OTHER): Payer: Medicare PPO | Admitting: Pharmacist

## 2020-03-04 ENCOUNTER — Other Ambulatory Visit: Payer: Self-pay

## 2020-03-04 VITALS — BP 130/50 | HR 54

## 2020-03-04 DIAGNOSIS — I10 Essential (primary) hypertension: Secondary | ICD-10-CM

## 2020-03-04 DIAGNOSIS — Z23 Encounter for immunization: Secondary | ICD-10-CM

## 2020-03-04 MED ORDER — HYDRALAZINE HCL 50 MG PO TABS
ORAL_TABLET | ORAL | 3 refills | Status: DC
Start: 2020-03-04 — End: 2020-12-17

## 2020-03-04 NOTE — Progress Notes (Signed)
Patient ID: BAHJA BENCE                 DOB: [birth year]                      MRN: [id number]     HPI: Vicki Perry is a 69 y.o. female referred by Dr. Bjorn Pippin to HTN clinic. PMH is significant for DM, HLD, and HTN.  At visit with pharmacy clinic on 02/06/20, patient's blood pressure was 148/58. Home readings varied from 120's-140's. No medication changes were made as patient had been working on her lifestyle changes including exercise. Patient has brought home monitor to clinic in the past and her monitor reads 5 mmHg lower than BP on aneroid.   She presents today for follow up. Still very excited and appreciative of the [employer] prep class referral. Denies dizziness, lightheadedness, headache, blurred vision, SOB or swelling. Home blood pressure is much improved and much more consistent. She is walking at home 4 times a week along with what she does at the [facility] class. She has been taking hydralazine 50mg  in the AM and 50mg  @ 2:00 instead of 50mg  in the AM and 25mg  @ 2 and 25mg  at bedtime. Blood pressure in clinic is 130/50 on second reading.  Patient states she needs her COVID booster, helped get her an apt today at [employer].  Current HTN meds: losartan 100mg  daily, hydralazine 50 mg QAM 50 mg @2pm , spironolactone 25 mg daily, amlodipine 2.5mg  daily    Previously tried: HCTZ 25 mg daily (hyponatremia), amlodipine 10 mg daily (edema), hydralazine 10mg  and 25mg  BID,hydralazine 50 mg TID (made her feet "tingle"), lisinopril 20mg  & 40 mg daily   BP goal: <130/80  Family History: HTN and DM (mother), HTN (father)  Diet:  - adheres to low salt diet per patient  - has increased water intake since last visit - has cut back on coffee (1/2 cup) - has not had caffeine beyond  - cut down on meat intake, more vegetables (fresh)  - has decreased how much she drinks at lunch  - down to 6 cups of water instead of 8  Exercise:  - 2 mile/day (this is a recent increase) ~ 30 min (4 days a week) - gets  wood for fireplace - clean windows, refrigerator  - [employer] prep class, 2 classes so far and really enjoys  Home BP readings:  132/59, 128/60, 133/64, 132/60, 136/58, 135/67, 121/65, 144/60, 133/58, 129/61, 132/60, 132/50, 127/57, 137/70, 133/51, 136/59, 128/57, 143/59, 127/58, 130/55, 128/64   Wt Readings from Last 3 Encounters:  12/10/19 152 lb 6.4 oz (69.1 kg)  09/23/19 151 lb (68.5 kg)  09/05/19 152 lb 9.6 oz (69.2 kg)   BP Readings from Last 3 Encounters:  01/08/20 (!) 150/68  12/24/19 (!) 172/62  12/10/19 (!) 206/84   Pulse Readings from Last 3 Encounters:  01/08/20 69  12/24/19 82  12/10/19 79    Labs: 01/08/20: K 4.5, Scr 1.09, Na 135  Past Medical History:  Diagnosis Date   Anemia    Diabetes mellitus    High cholesterol    Hypertension     Current Outpatient Medications on File Prior to Visit  Medication Sig Dispense Refill   amLODipine (NORVASC) 2.5 MG tablet Take 1 tablet (2.5 mg total) by mouth daily. 90 tablet 3   aspirin EC 81 MG tablet Take 81 mg by mouth daily. (Patient not taking: Reported on 01/08/2020)     calcium gluconate 500  MG tablet Take 1 tablet by mouth daily.     cholecalciferol (VITAMIN D3) 25 MCG (1000 UT) tablet Take 1,000 Units by mouth daily.     glucose blood (ONETOUCH VERIO) test strip 1 each by Other route 2 (two) times a day. 200 each 2   hydrALAZINE (APRESOLINE) 50 MG tablet Take 0.5 tablets (25 mg total) by mouth in the morning and at bedtime. 60 tablet 11   Lancets (ONETOUCH ULTRASOFT) lancets Use as directed to test blood sugar once daily E11.9 100 each 12   losartan (COZAAR) 100 MG tablet TAKE 1 TABLET (100 MG TOTAL) BY MOUTH DAILY. MUST SCHEDULE PHYSICAL 90 tablet 1   metFORMIN (GLUCOPHAGE) 500 MG tablet TAKE 1 TABLET BY MOUTH EVERY DAY WITH BREAKFAST 90 tablet 0   omeprazole (PRILOSEC) 20 MG capsule Take 1 capsule (20 mg total) by mouth daily. 90 capsule 1   simvastatin (ZOCOR) 40 MG tablet TAKE 1 TABLET BY MOUTH  DAILY AT 6 PM. 90 tablet 3   spironolactone (ALDACTONE) 25 MG tablet Take 0.5 tablets (12.5 mg total) by mouth daily. 15 tablet 0   No current facility-administered medications on file prior to visit.    Allergies  Allergen Reactions   Codeine Nausea Only    Vitals obtained today: BP: 148/58  HR: 68   Assessment/Plan:   1. Hypertension - Blood pressure is just about at goal of <130/80 in clinic today. Home readings are at or very close to goal. I am concerned with her taking her last hydralazine dose at 2 PM and not again until the AM as this medication is typically doses q 6-8 hours. I have asked her to start taking hydralazine 50mg  in the AM, 50mg  @ 2:00PM and 25mg  at bedtime. Continue losartan 100mg  daily, spironolactone 25 mg daily, amlodipine 2.5mg  daily. Follow up in the clinic in 1 month.   , Pharm.D, BCPS, CPP [facility]  [address] [place], [place] Floss  Phone: [phone]; Fax: [phone]

## 2020-03-04 NOTE — Patient Instructions (Addendum)
It was so nice to see you today!  Please start taking hydralazine 50 mg in the AM, 50mg  @ 2PM and 25mg  at bedtime. Continue losartan 100mg  daily, spironolactone 25 mg daily, amlodipine 2.5mg  daily    Keep up the good work with your exercise  Call me at [phone] with any questions

## 2020-03-04 NOTE — Progress Notes (Signed)
   Covid-19 Vaccination Clinic  Name:  Vicki Perry    MRN: [id number] DOB: Apr 30, 1950  03/04/2020  [name] was observed post Covid-19 immunization for 15 minutes without incident. She was provided with Vaccine Information Sheet and instruction to access the V-Safe system.   [name] was instructed to call 911 with any severe reactions post vaccine: Marland Kitchen Difficulty breathing  . Swelling of face and throat  . A fast heartbeat  . A bad rash all over body  . Dizziness and weakness   Immunizations Administered    Name Date Dose VIS Date Route   Pfizer COVID-19 Vaccine 03/04/2020  4:51 PM 0.3 mL 01/07/2020 Intramuscular   Manufacturer: [employer], [employer]   Lot: [id number]   NDC: [address]-0

## 2020-03-16 NOTE — Progress Notes (Signed)
[facility] [employer] PREP Weekly Session   Patient Details  Name: Vicki Perry MRN: [id number] Date of Birth: 1951/02/24 Age: 69 y.o. PCP: Lorre Munroe, NP  Vitals:   03/15/20 1300  Weight: 146 lb (66.2 kg)     [employer] [employer] Weekly seesion - 03/16/20 1100      Weekly Session   Topic Discussed Expectations and non-scale victories   class on 03/15/20 late entry   Minutes exercised this week 205 minutes    Classes attended to date 10          PREP class on 03/15/20  Fun things since last meeting: family time Grateful for: good health, better BP readings  Nutrition celebration: less desserts, low sugar Barriers/Struggles: coffee, tea, need less    Bonnye Fava 03/16/2020, 11:32 AM

## 2020-03-18 ENCOUNTER — Ambulatory Visit: Payer: Medicare PPO | Admitting: Gastroenterology

## 2020-03-23 NOTE — Progress Notes (Signed)
[facility] [employer] PREP Weekly Session   Patient Details  Name: Vicki Perry MRN: [id number] Date of Birth: January 23, 1951 Age: 70 y.o. PCP: Lorre Munroe, NP  Vitals:   03/23/20 1606  Weight: 145 lb (65.8 kg)     [facility] [employer] Weekly seesion - 03/23/20 1600      Weekly Session   Topic Discussed --   Portions   Minutes exercised this week 150 minutes    Classes attended to date 54          Fun things since last meeting: watched snow flakes Grateful for: home, warmth, shelter Nutrition celebration: less ice cream Barriers/struggles: sugar   Bonnye Fava 03/23/2020, 4:07 PM

## 2020-03-29 NOTE — Progress Notes (Signed)
[facility] [employer] PREP Weekly Session   Patient Details  Name: Vicki Perry MRN: [id number] Date of Birth: 06-27-1950 Age: 70 y.o. PCP: Lorre Munroe, NP  Vitals:   03/29/20 1434  Weight: 146 lb (66.2 kg)     [facility] [employer] Weekly seesion - 03/29/20 1400      Weekly Session   Topic Discussed Finding support    Minutes exercised this week 160 minutes    Classes attended to date 36          Fun things since last meeting: walking with nieces Sunday Grateful for: family and friends Nutrition celebration: lemons in water work! Enjoyed eating more green veggies Barriers/struggles: cutting salt and sugar    Pam Jerral Bonito 03/29/2020, 2:35 PM

## 2020-04-01 ENCOUNTER — Other Ambulatory Visit: Payer: Self-pay

## 2020-04-01 ENCOUNTER — Ambulatory Visit (INDEPENDENT_AMBULATORY_CARE_PROVIDER_SITE_OTHER): Payer: Medicare PPO | Admitting: Pharmacist

## 2020-04-01 VITALS — HR 74

## 2020-04-01 DIAGNOSIS — I10 Essential (primary) hypertension: Secondary | ICD-10-CM

## 2020-04-01 MED ORDER — AMLODIPINE BESYLATE 5 MG PO TABS: 5.0000 mg | ORAL_TABLET | Freq: Every day | ORAL | 3 refills | Status: DC

## 2020-04-01 NOTE — Patient Instructions (Signed)
It was so nice to see you today!  Please increase your amlodipine to 5mg  daily. You may take 2 of the 2.5mg  tablets to equal 5mg . Once you run out, start taking 1, 5mg  tablet.  Continue losartan 100mg  daily, hydralazine 50mg  in the AM, 50mg  @ 2:00PM  And spironolactone 25 mg daily.  Keep up the great work with your exercise!  Call me at [phone] with any questions

## 2020-04-01 NOTE — Progress Notes (Signed)
Patient ID: Vicki Perry                 DOB: 1950/08/21                      MRN: [id number]     HPI: Vicki Perry is a 70 y.o. female referred by Dr. Bjorn Pippin to HTN clinic. PMH is significant for DM, HLD, and HTN.  At visit with pharmacy clinic on 03/04/20, patient's blood pressure was 130/50. Home readings varied from 120's-140's. Patient's hydralazine was increase to hydralazine 50mg  in the AM, 50mg  @ 2:00PM and 25mg  at bedtime. Patient has brought home monitor to clinic in the past and her monitor reads 5 mmHg lower than BP on aneroid.   She presents today for follow up. Still very excited and appreciative of the [employer] prep class referral. Denies dizziness, lightheadedness, headache, blurred vision, SOB or swelling. Home blood pressure is much improved and much more consistent. Patient is only taking hydralazine 50mg  in the AM and at 2:00 PM. She states that when she takes her hydralazine at bedtime she gets up more frequently to go to the bathroom. Blood pressure in the AM has been pretty good, several below 130.  She got her COVID booster and is very appreciative of helping her make the appointment.   Current HTN meds: losartan 100mg  daily, hydralazine 50mg  in the AM and  50mg  @ 2:00PM, spironolactone 25 mg daily, amlodipine 2.5mg  daily    Previously tried: HCTZ 25 mg daily (hyponatremia), amlodipine 10 mg daily (edema), hydralazine 10mg  and 25mg  BID,hydralazine 50 mg TID (made her feet "tingle"), lisinopril 20mg  & 40 mg daily   BP goal: <130/80  Family History: HTN and DM (mother), HTN (father)  Diet:  - adheres to low salt diet per patient  - has increased water intake since last visit - has cut back on coffee (1/2 cup) - has not had caffeine beyond  - cut down on meat intake, more vegetables (fresh)  - has decreased how much she drinks at lunch  - down to 6 cups of water instead of 8  Exercise:  - 2 mile/day (this is a recent increase) ~ 30 min (4 days a week) - gets  wood for fireplace - clean windows, refrigerator  - [employer] prep class, 2 classes so far and really enjoys  Home BP readings: 131//64, 123/53, 132/65, 136/56, 125/62, 154/49, 144/60, 132/57, 128/64, 115/60, 144/52, 136/69, 128/55, 119/58, 137/65, 134/57, 130/63, 137/63, 129/65, 143/67, 132/57, 136/64, 130/55, 136/56, 128/80   Wt Readings from Last 3 Encounters:  12/10/19 152 lb 6.4 oz (69.1 kg)  09/23/19 151 lb (68.5 kg)  09/05/19 152 lb 9.6 oz (69.2 kg)   BP Readings from Last 3 Encounters:  01/08/20 (!) 150/68  12/24/19 (!) 172/62  12/10/19 (!) 206/84   Pulse Readings from Last 3 Encounters:  01/08/20 69  12/24/19 82  12/10/19 79    Labs: 01/08/20: K 4.5, Scr 1.09, Na 135  Past Medical History:  Diagnosis Date  . Anemia   . Diabetes mellitus   . High cholesterol   . Hypertension     Current Outpatient Medications on File Prior to Visit  Medication Sig Dispense Refill  . amLODipine (NORVASC) 2.5 MG tablet Take 1 tablet (2.5 mg total) by mouth daily. 90 tablet 3  . aspirin EC 81 MG tablet Take 81 mg by mouth daily. (Patient not taking: Reported on 01/08/2020)    . calcium gluconate 500 MG  tablet Take 1 tablet by mouth daily.    . cholecalciferol (VITAMIN D3) 25 MCG (1000 UT) tablet Take 1,000 Units by mouth daily.    Marland Kitchen glucose blood (ONETOUCH VERIO) test strip 1 each by Other route 2 (two) times a day. 200 each 2  . hydrALAZINE (APRESOLINE) 50 MG tablet Take 0.5 tablets (25 mg total) by mouth in the morning and at bedtime. 60 tablet 11  . Lancets (ONETOUCH ULTRASOFT) lancets Use as directed to test blood sugar once daily E11.9 100 each 12  . losartan (COZAAR) 100 MG tablet TAKE 1 TABLET (100 MG TOTAL) BY MOUTH DAILY. MUST SCHEDULE PHYSICAL 90 tablet 1  . metFORMIN (GLUCOPHAGE) 500 MG tablet TAKE 1 TABLET BY MOUTH EVERY DAY WITH BREAKFAST 90 tablet 0  . omeprazole (PRILOSEC) 20 MG capsule Take 1 capsule (20 mg total) by mouth daily. 90 capsule 1  . simvastatin (ZOCOR) 40 MG  tablet TAKE 1 TABLET BY MOUTH DAILY AT 6 PM. 90 tablet 3  . spironolactone (ALDACTONE) 25 MG tablet Take 0.5 tablets (12.5 mg total) by mouth daily. 15 tablet 0   No current facility-administered medications on file prior to visit.    Allergies  Allergen Reactions  . Codeine Nausea Only     Assessment/Plan:   1. Hypertension - Blood pressure is above goal of <130/80 in clinic today. About 1/3 of her home readings are at goal. Several in the 130's and a few outliers of 140's. She had swelling on amlodipine 10mg , but was never on the 5mg  dose. With slow titration, can usually avoid swelling, therefore will increase amlodipine to 5mg  daily. Continue losartan 100mg  daily, hydralazine 50mg  in the AM and 50mg  @ 2:00PM and spironolactone 25 mg daily. Continue exercising. Follow up in 1 month.  , Pharm.D, BCPS, CPP [facility]  [address], [place],   Phone: [phone]; Fax: [phone]

## 2020-04-12 NOTE — Progress Notes (Signed)
[facility] [employer] PREP Weekly Session   Patient Details  Name: Vicki Perry MRN: [id number] Date of Birth: 10-04-1950 Age: 70 y.o. PCP: Lorre Munroe, NP  Vitals:   04/12/20 1552  Weight: 146 lb (66.2 kg)     [facility] [employer] Weekly seesion - 04/12/20 1500      Weekly Session   Topic Discussed Calorie breakdown    Minutes exercised this week 190 minutes    Classes attended to date 65          Fun things since last meeting: watch snow and slowed down Grateful for: family Nutrition celebration: 1/2 ice cream serving :)  Barriers/struggles: too many sweets   Bonnye Fava 04/12/2020, 3:54 PM

## 2020-04-13 ENCOUNTER — Other Ambulatory Visit: Payer: Self-pay | Admitting: Internal Medicine

## 2020-04-13 ENCOUNTER — Ambulatory Visit
Admission: RE | Admit: 2020-04-13 | Discharge: 2020-04-13 | Disposition: A | Discharge status: Home / Self Care | Payer: Medicare PPO | Source: Ambulatory Visit | Attending: Internal Medicine | Admitting: Internal Medicine

## 2020-04-13 ENCOUNTER — Other Ambulatory Visit: Payer: Self-pay

## 2020-04-13 DIAGNOSIS — Z1231 Encounter for screening mammogram for malignant neoplasm of breast: Secondary | ICD-10-CM

## 2020-04-16 ENCOUNTER — Ambulatory Visit: Payer: Medicare PPO | Admitting: Gastroenterology

## 2020-04-29 ENCOUNTER — Ambulatory Visit: Payer: Medicare PPO

## 2020-04-29 NOTE — Progress Notes (Deleted)
Patient ID: TASFIA VASSEUR                 DOB: August 24, 1950                      MRN: [id number]     HPI: Vicki Perry is a 70 y.o. female referred by Dr. Bjorn Pippin to HTN clinic. PMH is significant for DM, HLD, and HTN.  At visit with pharmacy clinic on 04/01/20 her amlodipine was increased to 5mg  daily. Home readings were mostly in the 130's. Patient has brought home monitor to clinic in the past and her monitor reads 5 mmHg lower than BP on aneroid.   Home BP Dizziness, lightheadedness, headache, blurred vision, SOB, swelling Exercise Could swap out losartan  She presents today for follow up. Still very excited and appreciative of the [employer] prep class referral. Denies dizziness, lightheadedness, headache, blurred vision, SOB or swelling. Home blood pressure is much improved and much more consistent. Patient is only taking hydralazine 50mg  in the AM and at 2:00 PM. She states that when she takes her hydralazine at bedtime she gets up more frequently to go to the bathroom. Blood pressure in the AM has been pretty good, several below 130.  She got her COVID booster and is very appreciative of helping her make the appointment.   Current HTN meds: losartan 100mg  daily, hydralazine 50mg  in the AM and  50mg  @ 2:00PM, spironolactone 25 mg daily, amlodipine 5mg  daily    Previously tried: HCTZ 25 mg daily (hyponatremia), amlodipine 10 mg daily (edema), hydralazine 10mg  and 25mg  BID,hydralazine 50 mg TID (made her feet "tingle"), lisinopril 20mg  & 40 mg daily   BP goal: <130/80  Family History: HTN and DM (mother), HTN (father)  Diet:  - adheres to low salt diet per patient  - has increased water intake since last visit - has cut back on coffee (1/2 cup) - has not had caffeine beyond  - cut down on meat intake, more vegetables (fresh)  - has decreased how much she drinks at lunch  - down to 6 cups of water instead of 8  Exercise:  - 2 mile/day (this is a recent increase) ~ 30 min (4  days a week) - gets wood for fireplace - clean windows, refrigerator  - [employer] prep class, 2 classes so far and really enjoys  Home BP readings: 131//64, 123/53, 132/65, 136/56, 125/62, 154/49, 144/60, 132/57, 128/64, 115/60, 144/52, 136/69, 128/55, 119/58, 137/65, 134/57, 130/63, 137/63, 129/65, 143/67, 132/57, 136/64, 130/55, 136/56, 128/80   Wt Readings from Last 3 Encounters:  12/10/19 152 lb 6.4 oz (69.1 kg)  09/23/19 151 lb (68.5 kg)  09/05/19 152 lb 9.6 oz (69.2 kg)   BP Readings from Last 3 Encounters:  01/08/20 (!) 150/68  12/24/19 (!) 172/62  12/10/19 (!) 206/84   Pulse Readings from Last 3 Encounters:  01/08/20 69  12/24/19 82  12/10/19 79    Labs: 01/08/20: K 4.5, Scr 1.09, Na 135  Past Medical History:  Diagnosis Date  . Anemia   . Diabetes mellitus   . High cholesterol   . Hypertension     Current Outpatient Medications on File Prior to Visit  Medication Sig Dispense Refill  . amLODipine (NORVASC) 2.5 MG tablet Take 1 tablet (2.5 mg total) by mouth daily. 90 tablet 3  . aspirin EC 81 MG tablet Take 81 mg by mouth daily. (Patient not taking: Reported on 01/08/2020)    . calcium gluconate  500 MG tablet Take 1 tablet by mouth daily.    . cholecalciferol (VITAMIN D3) 25 MCG (1000 UT) tablet Take 1,000 Units by mouth daily.    Marland Kitchen glucose blood (ONETOUCH VERIO) test strip 1 each by Other route 2 (two) times a day. 200 each 2  . hydrALAZINE (APRESOLINE) 50 MG tablet Take 0.5 tablets (25 mg total) by mouth in the morning and at bedtime. 60 tablet 11  . Lancets (ONETOUCH ULTRASOFT) lancets Use as directed to test blood sugar once daily E11.9 100 each 12  . losartan (COZAAR) 100 MG tablet TAKE 1 TABLET (100 MG TOTAL) BY MOUTH DAILY. MUST SCHEDULE PHYSICAL 90 tablet 1  . metFORMIN (GLUCOPHAGE) 500 MG tablet TAKE 1 TABLET BY MOUTH EVERY DAY WITH BREAKFAST 90 tablet 0  . omeprazole (PRILOSEC) 20 MG capsule Take 1 capsule (20 mg total) by mouth daily. 90 capsule 1  .  simvastatin (ZOCOR) 40 MG tablet TAKE 1 TABLET BY MOUTH DAILY AT 6 PM. 90 tablet 3  . spironolactone (ALDACTONE) 25 MG tablet Take 0.5 tablets (12.5 mg total) by mouth daily. 15 tablet 0   No current facility-administered medications on file prior to visit.    Allergies  Allergen Reactions  . Codeine Nausea Only     Assessment/Plan:   1. Hypertension - Blood pressure is above goal of <130/80 in clinic today. About 1/3 of her home readings are at goal. Several in the 130's and a few outliers of 140's. She had swelling on amlodipine 10mg , but was never on the 5mg  dose. With slow titration, can usually avoid swelling, therefore will increase amlodipine to 5mg  daily. Continue losartan 100mg  daily, hydralazine 50mg  in the AM and 50mg  @ 2:00PM and spironolactone 25 mg daily. Continue exercising. Follow up in 1 month.  , Pharm.D, BCPS, CPP [facility]  [address] [place],   Phone: [phone]; Fax: [phone]

## 2020-05-03 NOTE — Progress Notes (Signed)
[facility] [employer] PREP Progress Report   Patient Details  Name: Vicki Perry MRN: [id number] Date of Birth: 02/20/1951 Age: 70 y.o. PCP: Lorre Munroe, NP  Vitals:   05/03/20 1601  BP: (!) 138/58  Pulse: 88  SpO2: 98%  Weight: 144 lb 6.4 oz (65.5 kg)      [facility] [employer] Eval - 05/03/20 1600      Referral    Referring Provider [facility] Date --   PREP final class 05/03/20     Measurement   Waist Circumference 36 inches    Hip Circumference 35 inches    Body fat 37.6 percent      Information for Trainer   Goals Next 90 day goals reviewed      Mobility and Daily Activities   I find it easy to walk up or down two or more flights of stairs. 3    I have no trouble taking out the trash. 4    I do housework such as vacuuming and dusting on my own without difficulty. 4    I can easily lift a gallon of milk (8lbs). 4    I can easily walk a mile. 4    I have no trouble reaching into high cupboards or reaching down to pick up something from the floor. 3    I do not have trouble doing out-door work such as Loss adjuster, chartered, raking leaves, or gardening. 4      Mobility and Daily Activities   I feel younger than my age. 4    I feel independent. 4    I feel energetic. 3    I live an active life.  4    I feel strong. 3    I feel healthy. 4    I feel active as other people my age. 4      How fit and strong are you.   Fit and Strong Total Score 52          Past Medical History:  Diagnosis Date  . Anemia   . Diabetes mellitus   . High cholesterol   . Hypertension    Past Surgical History:  Procedure Laterality Date  . CATARACT EXTRACTION, BILATERAL Bilateral 03/22/2018  . CESAREAN SECTION    . FOOT SURGERY Right    Bone Spur removal  . TONSILLECTOMY    . WISDOM TOOTH EXTRACTION     Social History   Tobacco Use  Smoking Status Never Smoker  Smokeless Tobacco Never Used     Significant improvement in cardio endurance and strength. Balance improved as  well.  Decrease in body fat, BMI with noted weight loss Has solid plans for continuing exercise Discussed ways to decrease stress levels by accepting/expecting help with family     Bonnye Fava 05/03/2020, 4:03 PM

## 2020-05-06 ENCOUNTER — Ambulatory Visit (INDEPENDENT_AMBULATORY_CARE_PROVIDER_SITE_OTHER): Payer: Medicare PPO | Admitting: Pharmacist

## 2020-05-06 ENCOUNTER — Other Ambulatory Visit: Payer: Self-pay

## 2020-05-06 VITALS — BP 128/50 | HR 72

## 2020-05-06 DIAGNOSIS — I10 Essential (primary) hypertension: Secondary | ICD-10-CM | POA: Diagnosis not present

## 2020-05-06 LAB — BASIC METABOLIC PANEL
BUN/Creatinine Ratio: 10 — ABNORMAL LOW (ref 12–28)
BUN: 13 mg/dL (ref 8–27)
CO2: 20 mmol/L (ref 20–29)
Calcium: 8.9 mg/dL (ref 8.7–10.3)
Chloride: 98 mmol/L (ref 96–106)
Creatinine, Ser: 1.24 mg/dL — ABNORMAL HIGH (ref 0.57–1.00)
GFR calc Af Amer: 50 mL/min/{1.73_m2} — ABNORMAL LOW (ref >59)
GFR calc non Af Amer: 44 mL/min/{1.73_m2} — ABNORMAL LOW (ref >59)
Glucose: 97 mg/dL (ref 65–99)
Potassium: 4.9 mmol/L (ref 3.5–5.2)
Sodium: 133 mmol/L — ABNORMAL LOW (ref 134–144)

## 2020-05-06 NOTE — Progress Notes (Signed)
Patient ID: Vicki Perry                 DOB: [birth year]                      MRN: [id number]     HPI: Vicki Perry is a 70 y.o. female referred by Dr. Bjorn Pippin to HTN clinic. PMH is significant for DM, HLD, and HTN.  At visit with pharmacy clinic on 04/01/20 her amlodipine was increased to 5mg  daily. Home readings were mostly in the 130's. Patient has brought home monitor to clinic in the past and her monitor reads 5 mmHg lower than BP on aneroid.   Patient presents today for follow up. She occasionally gets dizzy shortly after taking her BP meds in the AM. Lasts at most . Does not happen all the time. Denies headaches, blurred vision or swelling. Has been walking at least 1 mile if not more every day. Has weights to do some resistance exercises at home. Home blood pressures look good. Majority at goal. She states she feels great. Has more energy.  Current HTN meds: losartan 100mg  daily, hydralazine 50mg  in the AM and  50mg  @ 2:00PM, spironolactone 25 mg daily, amlodipine 5mg  daily    Previously tried: HCTZ 25 mg daily (hyponatremia), amlodipine 10 mg daily (edema), hydralazine 10mg  and 25mg  BID, hydralazine 50 mg TID (made her feet "tingle"), lisinopril 20mg  & 40 mg daily   BP goal: <130/80  Family History: HTN and DM (mother), HTN (father)  Diet:  - adheres to low salt diet per patient  - has increased water intake since last visit - has cut back on coffee (1/2 cup) - has not had caffeine beyond  - cut down on meat intake, more vegetables (fresh)  - has decreased how much she drinks at lunch  - down to 6 cups of water instead of 8  Exercise:  - at least a mile per day everyday - gets wood for fireplace - clean windows, refrigerator  - completed [employer] prep class  Home BP readings: 132/63, 131/66, 122/53, 137/59, 126/61, 124/53, 136/59, 131/57, 142/68, 136/59, 122/50, 124/49, 141/54, 129/48, 126/53, 130/75, 136/63, 124/60   Wt Readings from Last 3 Encounters:   12/10/19 152 lb 6.4 oz (69.1 kg)  09/23/19 151 lb (68.5 kg)  09/05/19 152 lb 9.6 oz (69.2 kg)   BP Readings from Last 3 Encounters:  01/08/20 (!) 150/68  12/24/19 (!) 172/62  12/10/19 (!) 206/84   Pulse Readings from Last 3 Encounters:  01/08/20 69  12/24/19 82  12/10/19 79    Labs: 01/08/20: K 4.5, Scr 1.09, Na 135  Past Medical History:  Diagnosis Date  . Anemia   . Diabetes mellitus   . High cholesterol   . Hypertension     Current Outpatient Medications on File Prior to Visit  Medication Sig Dispense Refill  . amLODipine (NORVASC) 2.5 MG tablet Take 1 tablet (2.5 mg total) by mouth daily. 90 tablet 3  . aspirin EC 81 MG tablet Take 81 mg by mouth daily. (Patient not taking: Reported on 01/08/2020)    . calcium gluconate 500 MG tablet Take 1 tablet by mouth daily.    . cholecalciferol (VITAMIN D3) 25 MCG (1000 UT) tablet Take 1,000 Units by mouth daily.    02/23/20 glucose blood (ONETOUCH VERIO) test strip 1 each by Other route 2 (two) times a day. 200 each 2  . hydrALAZINE (APRESOLINE) 50 MG tablet Take 0.5 tablets (  25 mg total) by mouth in the morning and at bedtime. 60 tablet 11  . Lancets (ONETOUCH ULTRASOFT) lancets Use as directed to test blood sugar once daily E11.9 100 each 12  . losartan (COZAAR) 100 MG tablet TAKE 1 TABLET (100 MG TOTAL) BY MOUTH DAILY. MUST SCHEDULE PHYSICAL 90 tablet 1  . metFORMIN (GLUCOPHAGE) 500 MG tablet TAKE 1 TABLET BY MOUTH EVERY DAY WITH BREAKFAST 90 tablet 0  . omeprazole (PRILOSEC) 20 MG capsule Take 1 capsule (20 mg total) by mouth daily. 90 capsule 1  . simvastatin (ZOCOR) 40 MG tablet TAKE 1 TABLET BY MOUTH DAILY AT 6 PM. 90 tablet 3  . spironolactone (ALDACTONE) 25 MG tablet Take 0.5 tablets (12.5 mg total) by mouth daily. 15 tablet 0   No current facility-administered medications on file prior to visit.    Allergies  Allergen Reactions  . Codeine Nausea Only     Assessment/Plan:   1. Hypertension - Blood pressure at goal of  <130/80 in clinic today. Majority of readings at goal. Continue losartan 100mg  daily, hydralazine 50mg  in the AM and 50mg  @ 2:00PM, spironolactone 25 mg daily and amlodipine 5mg  daily. Continue exercising. Follow up as needed.    , Pharm.D, BCPS, CPP [facility]  [address], [place], [place]  Phone: [phone]; Fax: [phone]

## 2020-05-06 NOTE — Patient Instructions (Signed)
Keep up the great work!  Call me at [phone] if you need anything!

## 2020-05-23 ENCOUNTER — Other Ambulatory Visit: Payer: Self-pay | Admitting: Internal Medicine

## 2020-05-25 ENCOUNTER — Other Ambulatory Visit: Payer: Self-pay

## 2020-05-25 ENCOUNTER — Other Ambulatory Visit (INDEPENDENT_AMBULATORY_CARE_PROVIDER_SITE_OTHER): Payer: Medicare PPO

## 2020-05-25 ENCOUNTER — Encounter: Payer: Self-pay | Admitting: Gastroenterology

## 2020-05-25 ENCOUNTER — Ambulatory Visit: Payer: Medicare PPO | Admitting: Gastroenterology

## 2020-05-25 VITALS — BP 130/60 | HR 64 | Ht 61.0 in | Wt 144.0 lb

## 2020-05-25 DIAGNOSIS — R1013 Epigastric pain: Secondary | ICD-10-CM | POA: Diagnosis not present

## 2020-05-25 DIAGNOSIS — K219 Gastro-esophageal reflux disease without esophagitis: Secondary | ICD-10-CM | POA: Diagnosis not present

## 2020-05-25 LAB — H. PYLORI ANTIBODY, IGG: H Pylori IgG: NEGATIVE

## 2020-05-25 MED ORDER — OMEPRAZOLE 20 MG PO CPDR: 20.0000 mg | DELAYED_RELEASE_CAPSULE | Freq: Every day | ORAL | 1 refills | Status: DC | PRN

## 2020-05-25 NOTE — Progress Notes (Signed)
HPI :  70 year old female with a history of GERD, CAD, diabetes, hypertension, referred by Nicki Reaper, NP for dyspepsia and GERD.  She endorses several years of symptoms that have bothered her.  Symptoms mainly include pyrosis in the lower chest and burning in her epigastric area.  Symptoms seem to be proceeded by eating and that is it very typical trigger for her.  In particular spicy foods can trigger her symptoms and she tries to avoid that.  She has some nocturnal symptoms but most symptoms are during the day.  Over time she states her symptoms have become more frequent.  She denies any dysphagia.  Has some nausea but no vomiting.  She denies any weight gain.  She has been trying to lose weight as part of a 12-week wellness weight loss program and is exercising more.  She denies any routine NSAID use.  She is prescribed Prilosec 20 mg a day and she thinks it may help when she takes it but she only takes it about 2 times a week at most.  She states she does not like to take medications and wants to avoid them.  She also uses baking soda with water as well for breakthrough symptoms.  She denies any cardiopulmonary symptoms at all.  No family history of esophageal cancer, colon cancer, or gastric cancer.  Her mother had cervical cancer, her father had coronary artery disease.  She has never had a prior EGD.  She has some occasional constipation, no blood in her stools.  No bowel problems that are new for her.  She had a negative colonoscopy in 2008 and then subsequently had a negative Cologuard in July 2021.  Prior work-up: Cologuard July 2021 - negative  Colonoscopy 05/21/2006 - Dr. Randa Evens - diverticulosis, hemorrhoids  Cardiac CT 07/22/19 - RECOMMENDATIONS: 1. Minimal non-obstructive CAD (0-24%). Consider preventive therapy and risk factor modification.    Past Medical History:  Diagnosis Date  . Anemia   . CAD (coronary artery disease)   . Diabetes mellitus   . High cholesterol   .  Hypertension      Past Surgical History:  Procedure Laterality Date  . CATARACT EXTRACTION, BILATERAL Bilateral 03/22/2018  . CESAREAN SECTION    . FOOT SURGERY Right    Bone Spur removal  . TONSILLECTOMY    . WISDOM TOOTH EXTRACTION     Family History  Problem Relation Age of Onset  . Diabetes Mother   . Hypertension Mother   . Cancer Mother 77       Cervical  . Hypertension Father   . Diabetes Maternal Grandmother   . Hypertension Sister   . Diabetes Sister   . Hypertension Brother   . Heart disease Neg Hx   . Stroke Neg Hx    Social History   Tobacco Use  . Smoking status: Never Smoker  . Smokeless tobacco: Never Used  Substance Use Topics  . Alcohol use: No  . Drug use: No   Current Outpatient Medications  Medication Sig Dispense Refill  . amLODipine (NORVASC) 5 MG tablet Take 1 tablet (5 mg total) by mouth daily. 90 tablet 3  . aspirin EC 81 MG tablet Take 81 mg by mouth daily.    . benzonatate (TESSALON) 100 MG capsule Take 1-2 capsules (100-200 mg total) by mouth 3 (three) times daily as needed. 60 capsule 0  . calcium gluconate 500 MG tablet Take 1 tablet by mouth daily.    . cetirizine (ZYRTEC ALLERGY) 10 MG  tablet Take 1 tablet (10 mg total) by mouth daily. 30 tablet 0  . cholecalciferol (VITAMIN D3) 25 MCG (1000 UT) tablet Take 1,000 Units by mouth daily.    Marland Kitchen glucose blood (ONETOUCH VERIO) test strip 1 each by Other route 2 (two) times a day. 200 each 2  . hydrALAZINE (APRESOLINE) 50 MG tablet Take 50mg  in the AM, 50 mg in the afternoon and 25mg  at night 225 tablet 3  . Lancets (ONETOUCH ULTRASOFT) lancets Use as directed to test blood sugar once daily E11.9 100 each 12  . losartan (COZAAR) 100 MG tablet TAKE 1 TABLET (100 MG TOTAL) BY MOUTH DAILY. MUST SCHEDULE PHYSICAL 90 tablet 1  . metFORMIN (GLUCOPHAGE) 500 MG tablet Take 1 tablet (500 mg total) by mouth daily with breakfast. SCHEDULE PHYSICAL 90 tablet 0  . omeprazole (PRILOSEC) 20 MG capsule Take 1  capsule (20 mg total) by mouth daily. (Patient taking differently: Take 20 mg by mouth as needed.) 90 capsule 1  . simvastatin (ZOCOR) 40 MG tablet TAKE 1 TABLET BY MOUTH DAILY AT 6 PM. 90 tablet 3  . spironolactone (ALDACTONE) 25 MG tablet Take 1 tablet (25 mg total) by mouth daily. 90 tablet 3   No current facility-administered medications for this visit.   Allergies  Allergen Reactions  . Codeine Nausea Only     Review of Systems: All systems reviewed and negative except where noted in HPI.   Lab Results  Component Value Date   WBC 7.8 09/19/2019   HGB 11.7 (L) 09/19/2019   HCT 37.8 09/19/2019   MCV 86.5 09/19/2019   PLT 353 09/19/2019    Lab Results  Component Value Date   CREATININE 1.24 (H) 05/06/2020   BUN 13 05/06/2020   NA 133 (L) 05/06/2020   K 4.9 05/06/2020   CL 98 05/06/2020   CO2 20 05/06/2020   Lab Results  Component Value Date   ALT 13 09/03/2018   AST 16 09/03/2018   ALKPHOS 84 09/03/2018   BILITOT 0.6 09/03/2018     Physical Exam: BP 130/60   Pulse 64   Ht 5\' 1"  (1.549 m)   Wt 144 lb (65.3 kg)   BMI 27.21 kg/m  Constitutional: Pleasant,well-developed, female in no acute distress. HEENT: Normocephalic and atraumatic. Conjunctivae are normal. No scleral icterus. Neck supple.  Cardiovascular: Normal rate, regular rhythm.  Pulmonary/chest: Effort normal and breath sounds normal.  Abdominal: Soft, nondistended, nontender. There are no masses palpable.  Extremities: no edema Lymphadenopathy: No cervical adenopathy noted. Neurological: Alert and oriented to person place and time. Skin: Skin is warm and dry. No rashes noted. Psychiatric: Normal mood and affect. Behavior is normal.   ASSESSMENT AND PLAN: 70 year old female here for new patient assessment of the following:  GERD Dyspepsia  I think the majority of her symptoms are probably related to reflux as she describes them today.  We discussed what reflux is, risks of uncontrolled  reflux to include Barrett's esophagus, etc.  We discussed options to treat reflux including antihistamines and proton pump inhibitors.  I discussed risk benefits of chronic PPI use in general and we discussed what treatment options she wanted to use.  If she is having frequent reflux symptoms that is bothering her to this extent I think she is likely going to need to take something more routinely, at least for now.  After discussion of options she wants to take her omeprazole that she already has dose to 20 mg a day for the next  4 to 8 weeks on a daily basis and see if she can get control the symptoms.  If this fails to improve things she will contact me and we will consider dose escalation versus change in regimen.  I offered her at this time in light of the symptoms given her age and use of PPIs over time, she wants to hold off on this for now and await her course on the omeprazole.  Other than her age she does not have any other risks for Barrett's.  I recommend screening for H. pylori to ensure negative given some of the symptoms as well, she is agreeable to that.  If symptoms fail to improve, or worsen in time, she will contact me and would be agreeable to EGD in that setting.  Otherwise given negative Cologuard recently she is not interested in further colon cancer screening.  Plan: - she will take omeprazole 20 mg every day for the next 4-8 weeks and observe response.  We discussed risks and benefits and she understands.  She will contact me if no improvement in the regimen, or worsening - H. pylori IgG serology - discussed EGD and offered that to the patient today, she wants to hold off for now but will consider this pending her course  Ileene Patrick, MD [facility]  CC: Lorre Munroe, NP

## 2020-05-25 NOTE — Patient Instructions (Signed)
If you are age 72 or older, your body mass index should be between 23-30. Your Body mass index is 27.21 kg/m. If this is out of the aforementioned range listed, please consider follow up with your Primary Care Provider.  If you are age 31 or younger, your body mass index should be between 19-25. Your Body mass index is 27.21 kg/m. If this is out of the aformentioned range listed, please consider follow up with your Primary Care Provider.   Please go to the lab in the basement of our building to have lab work done as you leave today. Hit "B" for basement when you get on the elevator.  When the doors open the lab is on your left.  We will call you with the results. Thank you.  Due to recent changes in healthcare laws, you may see the results of your imaging and laboratory studies on MyChart before your provider has had a chance to review them.  We understand that in some cases there may be results that are confusing or concerning to you. Not all laboratory results come back in the same time frame and the provider may be waiting for multiple results in order to interpret others.  Please give [place] 48 hours in order for your provider to thoroughly review all the results before contacting the office for clarification of your results.    We have sent the following medications to your pharmacy for you to pick up at your convenience: Omeprazole 20 mg: Take once daily as needed  Thank you for entrusting me with your care and for choosing [facility], Dr. Ileene Patrick

## 2020-05-31 ENCOUNTER — Other Ambulatory Visit: Payer: Self-pay | Admitting: Internal Medicine

## 2020-06-07 NOTE — Progress Notes (Signed)
Cardiology Office Note:    Date:  06/08/2020   ID:  Vicki Perry, DOB 1951/02/14, MRN [id number]  PCP:  Lorre Munroe, NP  Cardiologist:  No primary care provider on file.  Electrophysiologist:  None   Referring MD: Lorre Munroe, NP   Chief Complaint  Patient presents with  . Coronary Artery Disease    History of Present Illness:    Vicki Perry is a 70 y.o. female with a hx of hypertension, hyperlipidemia, diabetes who is referred by Nicki Reaper, NP for evaluation of hypertension and chest pain.  Reports has been having left-sided chest pain.  Describes as dull aching pain.  Has occurred 3-4 times in the last few weeks.  Can occur with exertion and last for up to an hour.  Was walking 3 miles per day, but has not been walking since November.  Denies any chest pain when she was walking in November.  States that she has gained 10 pounds since she stopped working.  Also periodically has left arm pain.  Checks BP at home, has been up to 170s 2 weeks ago but recently has been in the 130s.  No smoking history.  Brother had CVA.  TTE 07/01/2019 (study has been read but not transferring to Epic): normal biventricular function, Grade 2 diastolic dysfunction, no significant valvular disease.  Coronary CTA on 07/22/2019 showed calcium score 71 (72nd percentile), minimal nonobstructive CAD.  Since last clinic visit, she reports that she has been doing well.  Brought her BP log with her, has been 120s to 130s over 50s.  Denies any chest pain, dyspnea, lightheadedness, syncope, lower extremity edema, or palpitations.  Has been walking 2 miles 3 times per week, denies any exertional symptoms.      Past Medical History:  Diagnosis Date  . Anemia   . CAD (coronary artery disease)   . Diabetes mellitus   . High cholesterol   . Hypertension     Past Surgical History:  Procedure Laterality Date  . CATARACT EXTRACTION, BILATERAL Bilateral 03/22/2018  . CESAREAN SECTION    . FOOT SURGERY  Right    Bone Spur removal  . TONSILLECTOMY    . WISDOM TOOTH EXTRACTION      Current Medications: Current Meds  Medication Sig  . amLODipine (NORVASC) 5 MG tablet Take 1 tablet (5 mg total) by mouth daily.  . benzonatate (TESSALON) 100 MG capsule Take 1-2 capsules (100-200 mg total) by mouth 3 (three) times daily as needed.  . calcium gluconate 500 MG tablet Take 1 tablet by mouth daily.  . cetirizine (ZYRTEC ALLERGY) 10 MG tablet Take 1 tablet (10 mg total) by mouth daily.  . cholecalciferol (VITAMIN D3) 25 MCG (1000 UT) tablet Take 1,000 Units by mouth daily.  Marland Kitchen glucose blood (ONETOUCH VERIO) test strip 1 each by Other route 2 (two) times a day.  . hydrALAZINE (APRESOLINE) 50 MG tablet Take 50mg  in the AM, 50 mg in the afternoon and 25mg  at night  . Lancets (ONETOUCH ULTRASOFT) lancets Use as directed to test blood sugar once daily E11.9  . losartan (COZAAR) 100 MG tablet TAKE 1 TABLET (100 MG TOTAL) BY MOUTH DAILY. MUST SCHEDULE PHYSICAL  . metFORMIN (GLUCOPHAGE) 500 MG tablet TAKE 1 TABLET BY MOUTH DAILY WITH BREAKFAST. SCHEDULE PHYSICAL  . omeprazole (PRILOSEC) 20 MG capsule Take 1 capsule (20 mg total) by mouth daily as needed.  . simvastatin (ZOCOR) 40 MG tablet TAKE 1 TABLET BY MOUTH DAILY AT 6 PM.  .  spironolactone (ALDACTONE) 25 MG tablet Take 1 tablet (25 mg total) by mouth daily.  . [DISCONTINUED] aspirin EC 81 MG tablet Take 81 mg by mouth daily.     Allergies:   Codeine   Social History   Socioeconomic History  . Marital status: Single    Spouse name: Not on file  . Number of children: Not on file  . Years of education: Not on file  . Highest education level: Not on file  Occupational History  . Not on file  Tobacco Use  . Smoking status: Never Smoker  . Smokeless tobacco: Never Used  Substance and Sexual Activity  . Alcohol use: No  . Drug use: No  . Sexual activity: Not on file  Other Topics Concern  . Not on file  Social History Narrative  . Not on  file   Social Determinants of Health   Financial Resource Strain: Not on file  Food Insecurity: Not on file  Transportation Needs: Not on file  Physical Activity: Not on file  Stress: Not on file  Social Connections: Not on file     Family History: The patient's family history includes Cancer (age of onset: 49) in her mother; Diabetes in her maternal grandmother, mother, and sister; Hypertension in her brother, father, mother, and sister. There is no history of Heart disease or Stroke.  ROS:   Please see the history of present illness.     All other systems reviewed and are negative.  EKGs/Labs/Other Studies Reviewed:    The following studies were reviewed today:   EKG:  EKG is ordered today.  The ekg ordered demonstrates normal sinus rhythm, rate 60, no ST/T abnormalities  Recent Labs: 09/19/2019: Hemoglobin 11.7; Platelets 353 05/06/2020: BUN 13; Creatinine, Ser 1.24; Potassium 4.9; Sodium 133  Recent Lipid Panel    Component Value Date/Time   CHOL 114 09/05/2019 0953   TRIG 54 09/05/2019 0953   HDL 44 09/05/2019 0953   CHOLHDL 2.6 09/05/2019 0953   CHOLHDL 3 09/03/2018 0851   VLDL 19.8 09/03/2018 0851   LDLCALC 58 09/05/2019 0953    Physical Exam:    VS:  BP (!) 142/51   Pulse 60   Ht 5\' 1"  (1.549 m)   Wt 144 lb 12.8 oz (65.7 kg)   SpO2 99%   BMI 27.36 kg/m     Wt Readings from Last 3 Encounters:  06/08/20 144 lb 12.8 oz (65.7 kg)  05/25/20 144 lb (65.3 kg)  05/03/20 144 lb 6.4 oz (65.5 kg)     GEN:  Well nourished, well developed in no acute distress HEENT: Normal NECK: No JVD CARDIAC: RRR, no murmurs, rubs, gallops RESPIRATORY:  Clear to auscultation without rales, wheezing or rhonchi  ABDOMEN: Soft, non-tender, non-distended MUSCULOSKELETAL:  No edema; No deformity  SKIN: Warm and dry NEUROLOGIC:  Alert and oriented x 3 PSYCHIATRIC:  Normal affect   ASSESSMENT:    1. Coronary artery disease involving native coronary artery of native heart  without angina pectoris   2. Essential hypertension   3. Hyperlipidemia, unspecified hyperlipidemia type    PLAN:    In order of problems listed above:  CAD: Coronary CTA on 07/22/2019 showed calcium score 71 (72nd percentile), minimal nonobstructive CAD. Continues to have atypical chest pain, suspect GI etiology as occurs after eating.  Echocardiogram 07/01/19 showed normal biventricular function, Grade 2 diastolic dysfunction, no significant valvular disease. -LDL at goal less than 70 (58 on 09/05/19) on simvastatin 40 mg daily.   -Referred  to GI for evaluation.  Reports has improved with PPI  Hypertension: Currently on losartan 100 mg daily, amlodipine 5 mg daily, hydralazine 50 mg twice daily, spironolactone 25 mg daily.  Developed hyponatremia with hydrochlorothiazide, edema with amlodipine 10 mg daily.  Mildly elevated in clinic today, but reports has been under good control at home.  Will continue to monitor.  Will check BMP  Hyperlipidemia: On simvastatin 40 mg daily.  LDL 58 on 09/05/2019  Type 2 diabetes: A1c 6.5 on 03/17/2019.  On Metformin  RTC in 6 months   Medication Adjustments/Labs and Tests Ordered: Current medicines are reviewed at length with the patient today.  Concerns regarding medicines are outlined above.  Orders Placed This Encounter  Procedures  . Basic metabolic panel  . EKG 12-Lead   No orders of the defined types were placed in this encounter.   Patient Instructions  Medication Instructions:  Your physician recommends that you continue on your current medications as directed. Please refer to the Current Medication list given to you today.  *If you need a refill on your cardiac medications before your next appointment, please call your pharmacy*  Lab Work: BMET today  If you have labs (blood work) drawn today and your tests are completely normal, you will receive your results only by: Marland Kitchen MyChart Message (if you have MyChart) OR . A paper copy in the  mail If you have any lab test that is abnormal or we need to change your treatment, we will call you to review the results.   Follow-Up: At [facility], you and your health needs are our priority.  As part of our continuing mission to provide you with exceptional heart care, we have created designated Provider Care Teams.  These Care Teams include your primary Cardiologist (physician) and Advanced Practice Providers (APPs -  Physician Assistants and Nurse Practitioners) who all work together to provide you with the care you need, when you need it.  We recommend signing up for the patient portal called "MyChart".  Sign up information is provided on this After Visit Summary.  MyChart is used to connect with patients for Virtual Visits (Telemedicine).  Patients are able to view lab/test results, encounter notes, upcoming appointments, etc.  Non-urgent messages can be sent to your provider as well.   To learn more about what you can do with MyChart, go to [email or web address].    Your next appointment:   6 month(s)  The format for your next appointment:   In Person  Provider:   Epifanio Lesches, MD         Signed, [name], MD  06/08/2020 11:55 PM    [facility]

## 2020-06-08 ENCOUNTER — Ambulatory Visit: Payer: Medicare PPO | Admitting: Cardiology

## 2020-06-08 ENCOUNTER — Encounter: Payer: Self-pay | Admitting: Cardiology

## 2020-06-08 ENCOUNTER — Other Ambulatory Visit: Payer: Self-pay

## 2020-06-08 VITALS — BP 142/51 | HR 60 | Ht 61.0 in | Wt 144.8 lb

## 2020-06-08 DIAGNOSIS — I10 Essential (primary) hypertension: Secondary | ICD-10-CM | POA: Diagnosis not present

## 2020-06-08 DIAGNOSIS — E785 Hyperlipidemia, unspecified: Secondary | ICD-10-CM | POA: Diagnosis not present

## 2020-06-08 DIAGNOSIS — I2510 Atherosclerotic heart disease of native coronary artery without angina pectoris: Secondary | ICD-10-CM | POA: Diagnosis not present

## 2020-06-08 NOTE — Patient Instructions (Signed)
Medication Instructions:  °Your physician recommends that you continue on your current medications as directed. Please refer to the Current Medication list given to you today. ° °*If you need a refill on your cardiac medications before your next appointment, please call your pharmacy* ° °Lab Work: °BMET today ° °If you have labs (blood work) drawn today and your tests are completely normal, you will receive your results only by: °• MyChart Message (if you have MyChart) OR °• A paper copy in the mail °If you have any lab test that is abnormal or we need to change your treatment, we will call you to review the results. ° °Follow-Up: °At [facility], you and your health needs are our priority.  As part of our continuing mission to provide you with exceptional heart care, we have created designated Provider Care Teams.  These Care Teams include your primary Cardiologist (physician) and Advanced Practice Providers (APPs -  Physician Assistants and Nurse Practitioners) who all work together to provide you with the care you need, when you need it. ° °We recommend signing up for the patient portal called "MyChart".  Sign up information is provided on this After Visit Summary.  MyChart is used to connect with patients for Virtual Visits (Telemedicine).  Patients are able to view lab/test results, encounter notes, upcoming appointments, etc.  Non-urgent messages can be sent to your provider as well.   °To learn more about what you can do with MyChart, go to [email or web address].   ° °Your next appointment:   °6 month(s) ° °The format for your next appointment:   °In Person ° °Provider:   °Christopher Schumann, MD ° ° ° ° °

## 2020-06-09 ENCOUNTER — Encounter: Payer: Self-pay | Admitting: *Deleted

## 2020-06-09 LAB — BASIC METABOLIC PANEL
BUN/Creatinine Ratio: 13 (ref 12–28)
BUN: 16 mg/dL (ref 8–27)
CO2: 20 mmol/L (ref 20–29)
Calcium: 9.2 mg/dL (ref 8.7–10.3)
Chloride: 96 mmol/L (ref 96–106)
Creatinine, Ser: 1.21 mg/dL — ABNORMAL HIGH (ref 0.57–1.00)
Glucose: 99 mg/dL (ref 65–99)
Potassium: 4.8 mmol/L (ref 3.5–5.2)
Sodium: 133 mmol/L — ABNORMAL LOW (ref 134–144)
eGFR: 48 mL/min/{1.73_m2} — ABNORMAL LOW (ref >59)

## 2020-06-17 ENCOUNTER — Telehealth: Payer: Self-pay | Admitting: Cardiology

## 2020-06-17 NOTE — Telephone Encounter (Signed)
New message:   Patient calling to ask some one to call concering her labs.

## 2020-06-17 NOTE — Telephone Encounter (Signed)
BMET result: Vicki Ricks, RN 06/09/2020 10:44 AM EDT Results mailed to patient [name], MD 06/09/2020 9:41 AM EDT Stable labs  D/w pt all questions answered. Verbalized understanding.

## 2020-07-29 ENCOUNTER — Other Ambulatory Visit: Payer: Self-pay

## 2020-07-29 ENCOUNTER — Observation Stay (HOSPITAL_COMMUNITY)
Admission: EM | Admit: 2020-07-29 | Discharge: 2020-07-29 | Disposition: A | Discharge status: Home / Self Care | Payer: Medicare PPO | Attending: Internal Medicine | Admitting: Internal Medicine

## 2020-07-29 ENCOUNTER — Encounter (HOSPITAL_COMMUNITY): Payer: Self-pay

## 2020-07-29 ENCOUNTER — Emergency Department (HOSPITAL_COMMUNITY): Payer: Medicare PPO

## 2020-07-29 DIAGNOSIS — K219 Gastro-esophageal reflux disease without esophagitis: Secondary | ICD-10-CM

## 2020-07-29 DIAGNOSIS — I2510 Atherosclerotic heart disease of native coronary artery without angina pectoris: Secondary | ICD-10-CM | POA: Diagnosis not present

## 2020-07-29 DIAGNOSIS — E119 Type 2 diabetes mellitus without complications: Secondary | ICD-10-CM

## 2020-07-29 DIAGNOSIS — R0789 Other chest pain: Secondary | ICD-10-CM | POA: Diagnosis not present

## 2020-07-29 DIAGNOSIS — Z79899 Other long term (current) drug therapy: Secondary | ICD-10-CM | POA: Insufficient documentation

## 2020-07-29 DIAGNOSIS — E7800 Pure hypercholesterolemia, unspecified: Secondary | ICD-10-CM

## 2020-07-29 DIAGNOSIS — I10 Essential (primary) hypertension: Secondary | ICD-10-CM

## 2020-07-29 DIAGNOSIS — Z20822 Contact with and (suspected) exposure to covid-19: Secondary | ICD-10-CM | POA: Insufficient documentation

## 2020-07-29 DIAGNOSIS — E1169 Type 2 diabetes mellitus with other specified complication: Secondary | ICD-10-CM | POA: Diagnosis present

## 2020-07-29 DIAGNOSIS — R079 Chest pain, unspecified: Secondary | ICD-10-CM | POA: Diagnosis not present

## 2020-07-29 DIAGNOSIS — Z7984 Long term (current) use of oral hypoglycemic drugs: Secondary | ICD-10-CM | POA: Insufficient documentation

## 2020-07-29 LAB — HEMOGLOBIN A1C
Hgb A1c MFr Bld: 6 % — ABNORMAL HIGH (ref 4.8–5.6)
Mean Plasma Glucose: 125.5 mg/dL

## 2020-07-29 LAB — LIPID PANEL
Cholesterol: 111 mg/dL (ref 0–200)
HDL: 46 mg/dL (ref >40)
LDL Cholesterol: 59 mg/dL (ref 0–99)
Total CHOL/HDL Ratio: 2.4 RATIO
Triglycerides: 32 mg/dL (ref <150)
VLDL: 6 mg/dL (ref 0–40)

## 2020-07-29 LAB — CBC
HCT: 33.6 % — ABNORMAL LOW (ref 36.0–46.0)
Hemoglobin: 10.9 g/dL — ABNORMAL LOW (ref 12.0–15.0)
MCH: 28.5 pg (ref 26.0–34.0)
MCHC: 32.4 g/dL (ref 30.0–36.0)
MCV: 87.7 fL (ref 80.0–100.0)
Platelets: 305 10*3/uL (ref 150–400)
RBC: 3.83 MIL/uL — ABNORMAL LOW (ref 3.87–5.11)
RDW: 12.1 % (ref 11.5–15.5)
WBC: 8 10*3/uL (ref 4.0–10.5)
nRBC: 0 % (ref 0.0–0.2)

## 2020-07-29 LAB — BASIC METABOLIC PANEL
Anion gap: 8 (ref 5–15)
BUN: 11 mg/dL (ref 8–23)
CO2: 25 mmol/L (ref 22–32)
Calcium: 8.8 mg/dL — ABNORMAL LOW (ref 8.9–10.3)
Chloride: 99 mmol/L (ref 98–111)
Creatinine, Ser: 1.23 mg/dL — ABNORMAL HIGH (ref 0.44–1.00)
GFR, Estimated: 47 mL/min — ABNORMAL LOW (ref >60)
Glucose, Bld: 112 mg/dL — ABNORMAL HIGH (ref 70–99)
Potassium: 3.8 mmol/L (ref 3.5–5.1)
Sodium: 132 mmol/L — ABNORMAL LOW (ref 135–145)

## 2020-07-29 LAB — SARS CORONAVIRUS 2 (TAT 6-24 HRS): SARS Coronavirus 2: NEGATIVE

## 2020-07-29 LAB — TROPONIN I (HIGH SENSITIVITY)
Troponin I (High Sensitivity): 5 ng/L (ref <18)
Troponin I (High Sensitivity): 10 ng/L (ref <18)

## 2020-07-29 MED ORDER — ASPIRIN 81 MG PO CHEW
324.0000 mg | CHEWABLE_TABLET | Freq: Once | ORAL | Status: AC
  Administered 2020-07-29: 324 mg via ORAL
  Filled 2020-07-29: qty 4

## 2020-07-29 MED ORDER — ACETAMINOPHEN 325 MG PO TABS: 650.0000 mg | ORAL_TABLET | ORAL | Status: DC | PRN

## 2020-07-29 MED ORDER — PANTOPRAZOLE SODIUM 40 MG PO TBEC
40.0000 mg | DELAYED_RELEASE_TABLET | Freq: Every day | ORAL | Status: DC
  Administered 2020-07-29: 40 mg via ORAL
  Filled 2020-07-29: qty 1

## 2020-07-29 MED ORDER — INSULIN ASPART 100 UNIT/ML IJ SOLN: 0.0000 [IU] | Freq: Three times a day (TID) | INTRAMUSCULAR | Status: DC

## 2020-07-29 MED ORDER — ENOXAPARIN SODIUM 40 MG/0.4ML IJ SOSY
40.0000 mg | PREFILLED_SYRINGE | INTRAMUSCULAR | Status: DC
  Administered 2020-07-29: 40 mg via SUBCUTANEOUS
  Filled 2020-07-29: qty 0.4

## 2020-07-29 MED ORDER — ONDANSETRON HCL 4 MG/2ML IJ SOLN: 4.0000 mg | Freq: Four times a day (QID) | INTRAMUSCULAR | Status: DC | PRN

## 2020-07-29 MED ORDER — AMLODIPINE BESYLATE 5 MG PO TABS
5.0000 mg | ORAL_TABLET | Freq: Every day | ORAL | Status: DC
  Administered 2020-07-29: 5 mg via ORAL
  Filled 2020-07-29: qty 1

## 2020-07-29 MED ORDER — SPIRONOLACTONE 25 MG PO TABS
25.0000 mg | ORAL_TABLET | Freq: Every day | ORAL | Status: DC
  Administered 2020-07-29: 25 mg via ORAL
  Filled 2020-07-29: qty 1

## 2020-07-29 MED ORDER — ALUM & MAG HYDROXIDE-SIMETH 200-200-20 MG/5ML PO SUSP
30.0000 mL | Freq: Once | ORAL | Status: AC
  Administered 2020-07-29: 30 mL via ORAL
  Filled 2020-07-29: qty 30

## 2020-07-29 MED ORDER — ATORVASTATIN CALCIUM 40 MG PO TABS
40.0000 mg | ORAL_TABLET | Freq: Every day | ORAL | Status: DC
  Administered 2020-07-29: 40 mg via ORAL
  Filled 2020-07-29: qty 1

## 2020-07-29 MED ORDER — ASPIRIN EC 81 MG PO TBEC: 81.0000 mg | DELAYED_RELEASE_TABLET | Freq: Every day | ORAL | Status: DC

## 2020-07-29 MED ORDER — INSULIN ASPART 100 UNIT/ML IJ SOLN: 0.0000 [IU] | Freq: Every day | INTRAMUSCULAR | Status: DC

## 2020-07-29 MED ORDER — LOSARTAN POTASSIUM 50 MG PO TABS: 100.0000 mg | ORAL_TABLET | Freq: Every day | ORAL | Status: DC

## 2020-07-29 MED ORDER — HYDRALAZINE HCL 25 MG PO TABS
50.0000 mg | ORAL_TABLET | Freq: Two times a day (BID) | ORAL | Status: DC
  Administered 2020-07-29: 50 mg via ORAL
  Filled 2020-07-29: qty 2

## 2020-07-29 MED ORDER — LIDOCAINE VISCOUS HCL 2 % MT SOLN
15.0000 mL | Freq: Once | OROMUCOSAL | Status: AC
  Administered 2020-07-29: 15 mL via ORAL
  Filled 2020-07-29: qty 15

## 2020-07-29 NOTE — ED Notes (Signed)
Robin with bed placement called, we no longer need this bed. Patient will be discharged shortly.

## 2020-07-29 NOTE — ED Triage Notes (Signed)
Pt states that she woke up with CP, felt like her heart was racing, pain to R arm, denies n/v, some SOB.

## 2020-07-29 NOTE — H&P (Deleted)
History and Physical    Vicki CorneliaLoleta A Kopke UJW:[id number]:[id number] DOB: [birth year] DOA: 07/29/2020  PCP: Lorre MunroeBaity, Regina W, NP Consultants:  Bjorn PippinSchumann - cardiology; Ambruster - GI Patient coming from:  Home - lives with brother; NOK: Casimer LaniusSister, Leasa Graves, [phone]  Chief Complaint: CP  HPI: Vicki Perry is a 70 y.o. female with medical history significant of HTN; HLD; DM; and CAD presenting with CP.  She was doing her regular routine yesterday.  Before bed, she checked her BP and it was 148/; this was about normal for her.  She had an indigestion feeling.  About 1130, she woke up with severe indigestion feeling.  Her HR was rapid.  She laid back down and got up and checked her BP and it was 214.  Her R arm was cramping.  She had left-sided chest discomfort with pain in her R arm.  The pain is better but not gone now.  She thinks it got better with relaxing.  She had a similar episode in May 2021 and coronary CTA was done with minimal non-obstructive CAD; she has had BP medication adjustment since then with good results.    ED Course: Carryover, as per Dr. Loney Lohathore:  Patient with history of mild nonobstructive CAD per CTA done a year ago, hypertension, hyperlipidemia, diabetes presented to the ED with complaints of chest pain which has now resolved. Delta troponin 5 >10. Blood pressure elevated. Heart score 6 and admission requested for chest pain rule out. Patient was given aspirin in the ED.  Review of Systems: As per HPI; otherwise review of systems reviewed and negative.   Ambulatory Status:  Ambulates without assistance  COVID Vaccine Status:   Complete  Past Medical History:  Diagnosis Date  . Anemia   . CAD (coronary artery disease)   . Diabetes mellitus   . High cholesterol   . Hypertension     Past Surgical History:  Procedure Laterality Date  . CATARACT EXTRACTION, BILATERAL Bilateral 03/22/2018  . CESAREAN SECTION    . FOOT SURGERY Right    Bone Spur removal  .  TONSILLECTOMY    . WISDOM TOOTH EXTRACTION      Social History   Socioeconomic History  . Marital status: Single    Spouse name: Not on file  . Number of children: Not on file  . Years of education: Not on file  . Highest education level: Not on file  Occupational History  . Occupation: retired from [employer]  Tobacco Use  . Smoking status: Never Smoker  . Smokeless tobacco: Never Used  Substance and Sexual Activity  . Alcohol use: No  . Drug use: No  . Sexual activity: Not on file  Other Topics Concern  . Not on file  Social History Narrative  . Not on file   Social Determinants of Health   Financial Resource Strain: Not on file  Food Insecurity: Not on file  Transportation Needs: Not on file  Physical Activity: Not on file  Stress: Not on file  Social Connections: Not on file  Intimate Partner Violence: Not on file    Allergies  Allergen Reactions  . Codeine Nausea Only    Family History  Problem Relation Age of Onset  . Diabetes Mother   . Hypertension Mother   . Cancer Mother [id number]       Cervical  . Hypertension Father   . Diabetes Maternal Grandmother   . Hypertension Sister   . Diabetes Sister   . Hypertension Brother   .  Heart disease Neg Hx   . Stroke Neg Hx     Prior to Admission medications   Medication Sig Start Date End Date Taking? Authorizing Provider  amLODipine (NORVASC) 5 MG tablet Take 1 tablet (5 mg total) by mouth daily. 04/01/20  Yes [name], MD  amoxicillin (AMOXIL) 500 MG capsule Take 500 mg by mouth See admin instructions. Bid x 7 days 07/27/20  Yes [provider]  benzonatate (TESSALON) 100 MG capsule Take 1-2 capsules (100-200 mg total) by mouth 3 (three) times daily as needed. Patient taking differently: Take 100-200 mg by mouth 3 (three) times daily as needed for cough. 02/08/20  Yes Wallis Bamberg, PA-C  cetirizine (ZYRTEC ALLERGY) 10 MG tablet Take 1 tablet (10 mg total) by mouth daily. Patient taking  differently: Take 10 mg by mouth daily as needed for allergies. 02/08/20  Yes Wallis Bamberg, PA-C  cholecalciferol (VITAMIN D3) 25 MCG (1000 UT) tablet Take 1,000 Units by mouth daily.   Yes [provider]  glucose blood (ONETOUCH VERIO) test strip 1 each by Other route 2 (two) times a day. 09/04/18  Yes Baity, Salvadore Oxford, NP  hydrALAZINE (APRESOLINE) 50 MG tablet Take 50mg  in the AM, 50 mg in the afternoon and 25mg  at night Patient taking differently: Take 50 mg by mouth in the morning and at bedtime. 03/04/20  Yes , MD  Lancets [facility] ULTRASOFT) lancets Use as directed to test blood sugar once daily E11.9 08/24/16  Yes Baity, [facility], NP  losartan (COZAAR) 100 MG tablet TAKE 1 TABLET (100 MG TOTAL) BY MOUTH DAILY. MUST SCHEDULE PHYSICAL Patient taking differently: Take 100 mg by mouth daily. 06/01/20  Yes Baity, Salvadore Oxford, NP  metFORMIN (GLUCOPHAGE) 500 MG tablet TAKE 1 TABLET BY MOUTH DAILY WITH BREAKFAST. SCHEDULE PHYSICAL Patient taking differently: Take 500 mg by mouth daily with breakfast. 05/25/20  Yes Baity, Salvadore Oxford, NP  omeprazole (PRILOSEC) 20 MG capsule Take 1 capsule (20 mg total) by mouth daily as needed. Patient taking differently: Take 20 mg by mouth daily as needed (heart burn). 05/25/20  Yes Armbruster, Salvadore Oxford, MD  simvastatin (ZOCOR) 40 MG tablet TAKE 1 TABLET BY MOUTH DAILY AT 6 PM. Patient taking differently: Take 40 mg by mouth daily. 11/29/19  Yes Willaim Rayas, NP  spironolactone (ALDACTONE) 25 MG tablet Take 1 tablet (25 mg total) by mouth daily. 01/23/20  Yes Lorre Munroe, MD    Physical Exam: Vitals:   07/29/20 0630 07/29/20 0700 07/29/20 0730 07/29/20 0800  BP: (!) 187/74 (!) 157/55 (!) 143/62 (!) 151/68  Pulse: 80 68 65 65  Resp: 18 19 17 11   Temp:      TempSrc:      SpO2: 99% 99% 98% 99%  Weight:      Height:         . General:  Appears calm and comfortable and is in NAD . Eyes:  PERRL, EOMI, normal lids, iris . ENT:   grossly normal hearing, lips & tongue, mmm; appropriate dentition . Neck:  no LAD, masses or thyromegaly . Cardiovascular:  RRR, no m/r/g. No LE edema.  09/28/20 Respiratory:   CTA bilaterally with no wheezes/rales/rhonchi.  Normal respiratory effort. . Abdomen:  soft, NT, ND . Skin:  no rash or induration seen on limited exam . Musculoskeletal:  grossly normal tone BUE/BLE, good ROM, no bony abnormality . Psychiatric:  grossly normal mood and affect, speech fluent and appropriate, AOx3 . Neurologic:  CN  2-12 grossly intact, moves all extremities in coordinated fashion    Radiological Exams on Admission: Independently reviewed - see discussion in A/P where applicable  DG Chest 2 View  Result Date: 07/29/2020 CLINICAL DATA:  Chest pain EXAM: CHEST - 2 VIEW COMPARISON:  Chest x-ray 09/19/2019, CT cardiac 07/22/2019 FINDINGS: The heart size and mediastinal contours are unchanged. Aortic arch calcification. No focal consolidation. No pulmonary edema. No pleural effusion. No pneumothorax. No acute osseous abnormality. Mild degenerative changes of the right acromioclavicular joint. IMPRESSION: No active cardiopulmonary disease. Electronically Signed   By: Tish Frederickson M.D.   On: 07/29/2020 02:36    EKG: Independently reviewed.  NSR with rate 92; nonspecific ST changes with NSCSLT   Labs on Admission: I have personally reviewed the available labs and imaging studies at the time of the admission.  Pertinent labs:   Na++ 132 Glucose 112 BUN 11/Creatinine 1.23/GFR 47 - stable HS troponin 5, 10 WBC 8.0 Hgb 10.9   Assessment/Plan Principal Problem:   Chest pain Active Problems:   Hypertension   Hypercholesteremia   DM type 2 (diabetes mellitus, type 2) (HCC)   GERD (gastroesophageal reflux disease)   Chest pain -Patient with substernal chest pressure with indigestion overnight, improved with rest -2/3 typical symptoms suggestive of atypical cardiac chest pain.  -CXR unremarkable.    -Initial cardiac HS troponin negative with negative delta. -EKG not indicative of acute ischemia.  -CTA in 5/21 with nonobstructive mild CAD  -HEART pathway score is 6, indicating that the patient has an elevated risk score and requires further evaluation. -Will plan to place in observation status on telemetry to rule out ACS by overnight observation.  -Repeat EKG in AM -Start ASA 81 mg daily -Risk factor stratification with HgbA1c and FLP -Cardiology consultation  -NPO for possible stress test vs. cath  HTN -Continue Norvasc, hydralazine, Cozaar, Aldactone -Will also add prn hydralazine  HLD -Continue statin but change Zocor 40 to Lipitor 40 mg daily -Check lipids  DM -Check A1c -Hold Glucophage -Will cover with moderate-scale SSI for now  GERD -Will give GI cocktail -Will change prn 20 mg prilosec to standing 40 mg Protonix daily    Note: This patient has been tested and is pending for the novel coronavirus COVID-19. She has been fully vaccinated against COVID-19.    DVT prophylaxis: Lovenox  Code Status:  Full - confirmed with patient Family Communication: None present Disposition Plan:  The patient is from: home  Anticipated d[facility] is [facility]  Anticipated d/c date will depend on clinical response to treatment, but possibly as early as tomorrow if she has excellent response to treatment  Patient is currently: acutely ill Consults called: Cardiology  Admission status: It is my clinical opinion that referral for OBSERVATION is reasonable and necessary in this patient based on the above information provided. The aforementioned taken together are felt to place the patient at high risk for further clinical deterioration. However it is anticipated that the patient may be medically stable for discharge from the hospital within 24 to 48 hours.      Jonah Blue MD [facility]   How to contact the [facility] Attending or Consulting provider 7A - 7P  or covering provider during after hours 7P -7A, for this patient?  1. Check the care team in [facility] and look for a) attending/consulting TR[facility] provider listed and b) the [facility] team listed 2. Log into [email or web address] and use [place]'s universal password to access. If you do  not have the password, please contact the hospital operator. 3. Locate the [facility] provider you are looking for under [facility] and page to a number that you can be directly reached. 4. If you still have difficulty reaching the provider, please page the [facility] (Director on Call) for the Hospitalists listed on amion for assistance.   07/29/2020, 9:44 AM

## 2020-07-29 NOTE — Discharge Instructions (Signed)
Follow up with your primary care doctor to further discuss your blood pressure medications.   Return to ER for new or worsening conditions.

## 2020-07-29 NOTE — ED Provider Notes (Signed)
Contacted by hospitalist Dr. Ophelia Charter who is asking for patient to be discharged home per cardiology recommendations.  Reviewed cardiology note which states patient is safe for discharge home as chest pain is atypical thought to be GERD/reflux with no acute ED ECG changes, negative troponin x2 and normal chest x-ray. Please see cardiology consult note. Dr. Eden Emms did suggest possible increase on amlodipine to 10 mg daily if needed for blood pressure control.  I talked to patient about this recommendation.  She would like to hold off on dose increase as she has had leg swelling with amlodipine in the past.  She has been doing fine on the 5 mg and has been in close contact with PCP and pharmacist to make dose adjustments.  I feel this is reasonable.  Patient is stable to be discharged home.  Strict return precautions discussed.  Patient knows to follow-up closely with PCP for blood pressure recheck.   Portions of this note were generated with Scientist, clinical (histocompatibility and immunogenetics). Dictation errors may occur despite best attempts at proofreading.    Shanon Ace, PA-C 07/29/20 1122    Arby Barrette, MD 08/06/20 [phone]

## 2020-07-29 NOTE — ED Notes (Signed)
Received pt from lobby at this time. 

## 2020-07-29 NOTE — ED Provider Notes (Signed)
[facility] Provider Note   CSN: [id number] Arrival date & time: 07/29/20  0159     History Chief Complaint  Patient presents with  . Chest Pain    Vicki Perry is a 70 y.o. female.  HPI      1130PM woke up with chest pain, felt like indigestion and radiated to the right arm with right arm cramping pain, 8/10. Assoc headache, took blood pressure and was 214 systolic.  No nausea, no diaphoresis, no shortness of breath.  Pain not exertional.  Pain lasted about 1 hour, the indigestion, was more than usual and BP higher so came in to get checked out.  No abdominal pain, fever, cough.  Just kept feeling indigestion feelin but nothing made it better or worse, and was stronger than usual indigestion.  No changes in medication, diet, or missed doses of medication.   DM,htn,chol Started seeing Cardiologist because of episodes of pain over the past year. Had Coronoary CTA on 07/22/2019 calcium score of 71 minimal nonobstructive CAD.   Currently taking losartan, amlodipine, hydralazine, spironolactone  Stopped PPI 2 weeks ago because made nausea worse   No cigarettes, etoh or other drugs Family  Hx of CAD 4662  Past Medical History:  Diagnosis Date  . Anemia   . CAD (coronary artery disease)   . Diabetes mellitus   . High cholesterol   . Hypertension     Patient Active Problem List   Diagnosis Date Noted  . Chest pain 07/29/2020  . GERD (gastroesophageal reflux disease) 03/02/2015  . DM type 2 (diabetes mellitus, type 2) (HCC) 02/10/2013  . Anemia 10/18/2010  . Hypertension 10/18/2010  . Hypercholesteremia 10/18/2010    Past Surgical History:  Procedure Laterality Date  . CATARACT EXTRACTION, BILATERAL Bilateral 03/22/2018  . CESAREAN SECTION    . FOOT SURGERY Right    Bone Spur removal  . TONSILLECTOMY    . WISDOM TOOTH EXTRACTION       OB History   No obstetric history on file.     Family History  Problem Relation Age  of Onset  . Diabetes Mother   . Hypertension Mother   . Cancer Mother [id number]       Cervical  . Hypertension Father   . Diabetes Maternal Grandmother   . Hypertension Sister   . Diabetes Sister   . Hypertension Brother   . Heart disease Neg Hx   . Stroke Neg Hx     Social History   Tobacco Use  . Smoking status: Never Smoker  . Smokeless tobacco: Never Used  Substance Use Topics  . Alcohol use: No  . Drug use: No    Home Medications Prior to Admission medications   Medication Sig Start Date End Date Taking? Authorizing Provider  amLODipine (NORVASC) 5 MG tablet Take 1 tablet (5 mg total) by mouth daily. 04/01/20  Yes Little IshikawaSchumann, Christopher L, MD  amoxicillin (AMOXIL) 500 MG capsule Take 500 mg by mouth See admin instructions. Bid x 7 days 07/27/20  Yes [provider]  benzonatate (TESSALON) 100 MG capsule Take 1-2 capsules (100-200 mg total) by mouth 3 (three) times daily as needed. Patient taking differently: Take 100-200 mg by mouth 3 (three) times daily as needed for cough. 02/08/20  Yes Wallis BambergMani, Mario, PA-C  cetirizine (ZYRTEC ALLERGY) 10 MG tablet Take 1 tablet (10 mg total) by mouth daily. Patient taking differently: Take 10 mg by mouth daily as needed for allergies. 02/08/20  Yes Mani,  Marquita Palms, PA-C  cholecalciferol (VITAMIN D3) 25 MCG (1000 UT) tablet Take 1,000 Units by mouth daily.   Yes [provider]  glucose blood (ONETOUCH VERIO) test strip 1 each by Other route 2 (two) times a day. 09/04/18  Yes Baity, Salvadore Oxford, NP  hydrALAZINE (APRESOLINE) 50 MG tablet Take 50mg  in the AM, 50 mg in the afternoon and 25mg  at night Patient taking differently: Take 50 mg by mouth in the morning and at bedtime. 03/04/20  Yes , MD  Lancets [facility] ULTRASOFT) lancets Use as directed to test blood sugar once daily E11.9 08/24/16  Yes Baity, [facility], NP  losartan (COZAAR) 100 MG tablet TAKE 1 TABLET (100 MG TOTAL) BY MOUTH DAILY. MUST SCHEDULE  PHYSICAL Patient taking differently: Take 100 mg by mouth daily. 06/01/20  Yes Baity, Salvadore Oxford, NP  metFORMIN (GLUCOPHAGE) 500 MG tablet TAKE 1 TABLET BY MOUTH DAILY WITH BREAKFAST. SCHEDULE PHYSICAL Patient taking differently: Take 500 mg by mouth daily with breakfast. 05/25/20  Yes Baity, Salvadore Oxford, NP  omeprazole (PRILOSEC) 20 MG capsule Take 1 capsule (20 mg total) by mouth daily as needed. Patient taking differently: Take 20 mg by mouth daily as needed (heart burn). 05/25/20  Yes Armbruster, Salvadore Oxford, MD  simvastatin (ZOCOR) 40 MG tablet TAKE 1 TABLET BY MOUTH DAILY AT 6 PM. Patient taking differently: Take 40 mg by mouth daily. 11/29/19  Yes Willaim Rayas, NP  spironolactone (ALDACTONE) 25 MG tablet Take 1 tablet (25 mg total) by mouth daily. 01/23/20  Yes Lorre Munroe, MD    Allergies    Codeine  Review of Systems   Review of Systems  Constitutional: Negative for fever.  HENT: Negative for sore throat.   Eyes: Negative for visual disturbance.  Respiratory: Negative for cough and shortness of breath.   Cardiovascular: Positive for chest pain and palpitations.  Gastrointestinal: Negative for abdominal pain, nausea and vomiting.  Genitourinary: Negative for difficulty urinating.  Musculoskeletal: Positive for arthralgias and myalgias. Negative for back pain and neck pain.  Skin: Negative for rash.  Neurological: Negative for syncope, light-headedness and headaches.    Physical Exam Updated Vital Signs BP (!) 155/62   Pulse 68   Temp 98.2 F (36.8 C) (Oral)   Resp 19   Ht 5\' 1"  (1.549 m)   Wt 65.7 kg   SpO2 99%   BMI 27.37 kg/m   Physical Exam Vitals and nursing note reviewed.  Constitutional:      General: She is not in acute distress.    Appearance: She is well-developed. She is not diaphoretic.  HENT:     Head: Normocephalic and atraumatic.  Eyes:     Conjunctiva/sclera: Conjunctivae normal.  Cardiovascular:     Rate and Rhythm: Normal rate and regular  rhythm.     Heart sounds: Normal heart sounds. No murmur heard. No friction rub. No gallop.   Pulmonary:     Effort: Pulmonary effort is normal. No respiratory distress.     Breath sounds: Normal breath sounds. No wheezing or rales.  Abdominal:     General: There is no distension.     Palpations: Abdomen is soft.     Tenderness: There is no abdominal tenderness. There is no guarding.  Musculoskeletal:        General: No tenderness.     Cervical back: Normal range of motion.  Skin:    General: Skin is warm and dry.     Findings: No erythema or rash.  Neurological:     Mental Status: She is alert and oriented to person, place, and time.     ED Results / Procedures / Treatments   Labs (all labs ordered are listed, but only abnormal results are displayed) Labs Reviewed  BASIC METABOLIC PANEL - Abnormal; Notable for the following components:      Result Value   Sodium 132 (*)    Glucose, Bld 112 (*)    Creatinine, Ser 1.23 (*)    Calcium 8.8 (*)    GFR, Estimated 47 (*)    All other components within normal limits  CBC - Abnormal; Notable for the following components:   RBC 3.83 (*)    Hemoglobin 10.9 (*)    HCT 33.6 (*)    All other components within normal limits  HEMOGLOBIN A1C - Abnormal; Notable for the following components:   Hgb A1c MFr Bld 6.0 (*)    All other components within normal limits  SARS CORONAVIRUS 2 (TAT 6-24 HRS)  LIPID PANEL  TROPONIN I (HIGH SENSITIVITY)  TROPONIN I (HIGH SENSITIVITY)    EKG EKG Interpretation  Date/Time:  Thursday Jul 29 2020 02:06:08 EDT Ventricular Rate:  92 PR Interval:  138 QRS Duration: 90 QT Interval:  340 QTC Calculation: 420 R Axis:   57 Text Interpretation: Normal sinus rhythm Cannot rule out Anterior infarct , age undetermined Abnormal ECG Artifact No significant change since last tracing Confirmed by Alvira Monday ([id number]) on 07/29/2020 3:38:22 AM   Radiology DG Chest 2 View  Result Date:  07/29/2020 CLINICAL DATA:  Chest pain EXAM: CHEST - 2 VIEW COMPARISON:  Chest x-ray 09/19/2019, CT cardiac 07/22/2019 FINDINGS: The heart size and mediastinal contours are unchanged. Aortic arch calcification. No focal consolidation. No pulmonary edema. No pleural effusion. No pneumothorax. No acute osseous abnormality. Mild degenerative changes of the right acromioclavicular joint. IMPRESSION: No active cardiopulmonary disease. Electronically Signed   By: Tish Frederickson M.D.   On: 07/29/2020 02:36    Procedures Procedures   Medications Ordered in ED Medications  amLODipine (NORVASC) tablet 5 mg (5 mg Oral Given 07/29/20 1024)  hydrALAZINE (APRESOLINE) tablet 50 mg (50 mg Oral Given 07/29/20 1024)  atorvastatin (LIPITOR) tablet 40 mg (40 mg Oral Given 07/29/20 1024)  insulin aspart (novoLOG) injection 0-15 Units (0 Units Subcutaneous Patient Refused/Not Given 07/29/20 1118)  enoxaparin (LOVENOX) injection 40 mg (40 mg Subcutaneous Given 07/29/20 1024)  acetaminophen (TYLENOL) tablet 650 mg (has no administration in time range)  ondansetron (ZOFRAN) injection 4 mg (has no administration in time range)  insulin aspart (novoLOG) injection 0-5 Units (has no administration in time range)  losartan (COZAAR) tablet 100 mg (100 mg Oral Not Given 07/29/20 1110)  spironolactone (ALDACTONE) tablet 25 mg (25 mg Oral Given 07/29/20 1111)  aspirin EC tablet 81 mg (has no administration in time range)  pantoprazole (PROTONIX) EC tablet 40 mg (40 mg Oral Given 07/29/20 1024)  aspirin chewable tablet 324 mg (324 mg Oral Given 07/29/20 0640)  alum & mag hydroxide-simeth (MAALOX/MYLANTA) 200-200-20 MG/5ML suspension 30 mL (30 mLs Oral Given 07/29/20 1025)    And  lidocaine (XYLOCAINE) 2 % viscous mouth solution 15 mL (15 mLs Oral Given 07/29/20 1025)    ED Course  I have reviewed the triage vital signs and the nursing notes.  Pertinent labs & imaging results that were available during my care of the patient were  reviewed by me and considered in my medical decision making (see chart for details).    MDM  Rules/Calculators/A&P                          70yo female with history of DM, htn, hlpd, nonobstructive CAD, presents with concern for chest pain. Differential diagnosis for chest pain includes pulmonary embolus, dissection, pneumothorax, pneumonia, ACS, myocarditis, pericarditis, GERD.  EKG was done and evaluate by me and showed no acute ST changes and no signs of pericarditis. Chest x-ray was done and evaluated by me and radiology and showed no sign of pneumonia or pneumothorax. Patient is low risk [place] without dyspnea and have low suspicion for PE.  Do not feel history or exam are consistent with aortic dissection.  Initial troponin 5. Discussed possible GERD causing pain and elevated blood pressures and plan for repeat troponin. No abdominal pain or tenderness to suggest intraabdominal etiology.   Repeat troponin 10, making change of 5 and with HEART score of 6 and symptoms starting prior to arrival and given the trend of her troponins with delta of 5 and elevated HEART score recommend admission and called hospitalist.    Final Clinical Impression(s) / ED Diagnoses Final diagnoses:  Atypical chest pain    Rx / DC Orders ED Discharge Orders    None       Alvira Monday, MD 07/29/20 1458

## 2020-07-29 NOTE — Consult Note (Signed)
CARDIOLOGY CONSULT NOTE       Patient ID: Vicki Perry MRN: [id number] DOB/AGE: 04/29/1950 70 y.o.  Admit date: 07/29/2020 Referring Physician: Ophelia Charter Primary Physician: Lorre Munroe, NP Primary Cardiologist: Jerene Pitch Reason for Consultation: Chest Pain  Principal Problem:   Chest pain Active Problems:   Hypertension   Hypercholesteremia   DM type 2 (diabetes mellitus, type 2) (HCC)   GERD (gastroesophageal reflux disease)   HPI:  70 y.o. with history of DM, HLD, HTN. Admitted this am with chest pain. She had some spicy fish last night and developed GERD/heartburn Took Some baking soda and warm water with partial relief. Pressure waxed/waned She got anxious and took her BP/pulse and they began to elevate so  She went to ER. BP initially elevated now improved Feels better now No dyspnea, palpitations syncope. No nausea/vomiting. In ER r/o troponin negative X 2, ECG with no acute changes and CXR NAD. Telemetry normal  She has had cardiac CTA done 07/22/19 with no obstructive disease 1-24% and calcium score of  71 which was 72 nd percentile   I reviewed her echo that was just done and EF normal no RWMAls, trivial posterior effusion Normal RV and AV sclerosis   ROS All other systems reviewed and negative except as noted above  Past Medical History:  Diagnosis Date  . Anemia   . CAD (coronary artery disease)   . Diabetes mellitus   . High cholesterol   . Hypertension     Family History  Problem Relation Age of Onset  . Diabetes Mother   . Hypertension Mother   . Cancer Mother 57       Cervical  . Hypertension Father   . Diabetes Maternal Grandmother   . Hypertension Sister   . Diabetes Sister   . Hypertension Brother   . Heart disease Neg Hx   . Stroke Neg Hx     Social History   Socioeconomic History  . Marital status: Single    Spouse name: Not on file  . Number of children: Not on file  . Years of education: Not on file  . Highest education level: Not on  file  Occupational History  . Occupation: retired from [employer]  Tobacco Use  . Smoking status: Never Smoker  . Smokeless tobacco: Never Used  Substance and Sexual Activity  . Alcohol use: No  . Drug use: No  . Sexual activity: Not on file  Other Topics Concern  . Not on file  Social History Narrative  . Not on file   Social Determinants of Health   Financial Resource Strain: Not on file  Food Insecurity: Not on file  Transportation Needs: Not on file  Physical Activity: Not on file  Stress: Not on file  Social Connections: Not on file  Intimate Partner Violence: Not on file    Past Surgical History:  Procedure Laterality Date  . CATARACT EXTRACTION, BILATERAL Bilateral 03/22/2018  . CESAREAN SECTION    . FOOT SURGERY Right    Bone Spur removal  . TONSILLECTOMY    . WISDOM TOOTH EXTRACTION        Current Facility-Administered Medications:  .  acetaminophen (TYLENOL) tablet 650 mg, 650 mg, Oral, Q4H PRN, Jonah Blue, MD .  amLODipine (NORVASC) tablet 5 mg, 5 mg, Oral, Daily, Jonah Blue, MD, 5 mg at 07/29/20 1024 .  [START ON 07/30/2020] aspirin EC tablet 81 mg, 81 mg, Oral, Daily, Jonah Blue, MD .  atorvastatin (LIPITOR) tablet 40 mg, 40  mg, Oral, Daily, Jonah Blue, MD, 40 mg at 07/29/20 1024 .  enoxaparin (LOVENOX) injection 40 mg, 40 mg, Subcutaneous, Q24H, Jonah Blue, MD, 40 mg at 07/29/20 1024 .  hydrALAZINE (APRESOLINE) tablet 50 mg, 50 mg, Oral, BID, Jonah Blue, MD, 50 mg at 07/29/20 1024 .  insulin aspart (novoLOG) injection 0-15 Units, 0-15 Units, Subcutaneous, TID WC, Jonah Blue, MD .  insulin aspart (novoLOG) injection 0-5 Units, 0-5 Units, Subcutaneous, QHS, Jonah Blue, MD .  losartan (COZAAR) tablet 100 mg, 100 mg, Oral, Daily, Jonah Blue, MD .  ondansetron [facility]) injection 4 mg, 4 mg, Intravenous, Q6H PRN, Jonah Blue, MD .  pantoprazole (PROTONIX) EC tablet 40 mg, 40 mg, Oral, Daily, Jonah Blue, MD, 40 mg at  07/29/20 1024 .  spironolactone (ALDACTONE) tablet 25 mg, 25 mg, Oral, Daily, Jonah Blue, MD  Current Outpatient Medications:  .  amLODipine (NORVASC) 5 MG tablet, Take 1 tablet (5 mg total) by mouth daily., Disp: 90 tablet, Rfl: 3 .  amoxicillin (AMOXIL) 500 MG capsule, Take 500 mg by mouth See admin instructions. Bid x 7 days, Disp: , Rfl:  .  benzonatate (TESSALON) 100 MG capsule, Take 1-2 capsules (100-200 mg total) by mouth 3 (three) times daily as needed. (Patient taking differently: Take 100-200 mg by mouth 3 (three) times daily as needed for cough.), Disp: 60 capsule, Rfl: 0 .  cetirizine (ZYRTEC ALLERGY) 10 MG tablet, Take 1 tablet (10 mg total) by mouth daily. (Patient taking differently: Take 10 mg by mouth daily as needed for allergies.), Disp: 30 tablet, Rfl: 0 .  cholecalciferol (VITAMIN D3) 25 MCG (1000 UT) tablet, Take 1,000 Units by mouth daily., Disp: , Rfl:  .  glucose blood (ONETOUCH VERIO) test strip, 1 each by Other route 2 (two) times a day., Disp: 200 each, Rfl: 2 .  hydrALAZINE (APRESOLINE) 50 MG tablet, Take 50mg  in the AM, 50 mg in the afternoon and 25mg  at night (Patient taking differently: Take 50 mg by mouth in the morning and at bedtime.), Disp: 225 tablet, Rfl: 3 .  Lancets (ONETOUCH ULTRASOFT) lancets, Use as directed to test blood sugar once daily E11.9, Disp: 100 each, Rfl: 12 .  losartan (COZAAR) 100 MG tablet, TAKE 1 TABLET (100 MG TOTAL) BY MOUTH DAILY. MUST SCHEDULE PHYSICAL (Patient taking differently: Take 100 mg by mouth daily.), Disp: 90 tablet, Rfl: 0 .  metFORMIN (GLUCOPHAGE) 500 MG tablet, TAKE 1 TABLET BY MOUTH DAILY WITH BREAKFAST. SCHEDULE PHYSICAL (Patient taking differently: Take 500 mg by mouth daily with breakfast.), Disp: 90 tablet, Rfl: 0 .  omeprazole (PRILOSEC) 20 MG capsule, Take 1 capsule (20 mg total) by mouth daily as needed. (Patient taking differently: Take 20 mg by mouth daily as needed (heart burn).), Disp: 90 capsule, Rfl: 1 .   simvastatin (ZOCOR) 40 MG tablet, TAKE 1 TABLET BY MOUTH DAILY AT 6 PM. (Patient taking differently: Take 40 mg by mouth daily.), Disp: 90 tablet, Rfl: 3 .  spironolactone (ALDACTONE) 25 MG tablet, Take 1 tablet (25 mg total) by mouth daily., Disp: 90 tablet, Rfl: 3 . amLODipine  5 mg Oral Daily  . [START ON 07/30/2020] aspirin EC  81 mg Oral Daily  . atorvastatin  40 mg Oral Daily  . enoxaparin (LOVENOX) injection  40 mg Subcutaneous Q24H  . hydrALAZINE  50 mg Oral BID  . insulin aspart  0-15 Units Subcutaneous TID WC  . insulin aspart  0-5 Units Subcutaneous QHS  . losartan  100 mg Oral Daily  .  pantoprazole  40 mg Oral Daily  . spironolactone  25 mg Oral Daily     Physical Exam: Blood pressure (!) 161/66, pulse 75, temperature 98.3 F (36.8 C), temperature source Oral, resp. rate 14, height 5\' 1"  (1.549 m), weight 65.7 kg, SpO2 100 %.   Affect appropriate Healthy:  appears stated age HEENT: normal Neck supple with no adenopathy JVP normal no bruits no thyromegaly Lungs clear with no wheezing and good diaphragmatic motion Heart:  S1/S2 SEM  murmur, no rub, gallop or click PMI normal Abdomen: benighn, BS positve, no tenderness, no AAA no bruit.  No HSM or HJR Distal pulses intact with no bruits No edema Neuro non-focal Skin warm and dry No muscular weakness   Labs:   Lab Results  Component Value Date   WBC 8.0 07/29/2020   HGB 10.9 (L) 07/29/2020   HCT 33.6 (L) 07/29/2020   MCV 87.7 07/29/2020   PLT 305 07/29/2020    Recent Labs  Lab 07/29/20 0210  NA 132*  K 3.8  CL 99  CO2 25  BUN 11  CREATININE 1.23*  CALCIUM 8.8*  GLUCOSE 112*   No results found for: CKTOTAL, CKMB, CKMBINDEX, TROPONINI  Lab Results  Component Value Date   CHOL 114 09/05/2019   CHOL 137 09/03/2018   CHOL 127 05/03/2017   Lab Results  Component Value Date   HDL 44 09/05/2019   HDL 49.30 09/03/2018   HDL 47.10 05/03/2017   Lab Results  Component Value Date   LDLCALC 58  09/05/2019   LDLCALC 68 09/03/2018   LDLCALC 64 05/03/2017   Lab Results  Component Value Date   TRIG 54 09/05/2019   TRIG 99.0 09/03/2018   TRIG 78.0 05/03/2017   Lab Results  Component Value Date   CHOLHDL 2.6 09/05/2019   CHOLHDL 3 09/03/2018   CHOLHDL 3 05/03/2017   No results found for: LDLDIRECT    Radiology: DG Chest 2 View  Result Date: 07/29/2020 CLINICAL DATA:  Chest pain EXAM: CHEST - 2 VIEW COMPARISON:  Chest x-ray 09/19/2019, CT cardiac 07/22/2019 FINDINGS: The heart size and mediastinal contours are unchanged. Aortic arch calcification. No focal consolidation. No pulmonary edema. No pleural effusion. No pneumothorax. No acute osseous abnormality. Mild degenerative changes of the right acromioclavicular joint. IMPRESSION: No active cardiopulmonary disease. Electronically Signed   By: 09/21/2019 M.D.   On: 07/29/2020 02:36    EKG: NSR no acute changes rate 92    ASSESSMENT AND PLAN:   1. Chest pain: atypical seems to be GERD/Reflux with no acute ECG changes, negative troponin x 2, normal CXR. Normal echo with no RWMAls and recent Cardiac CTA non obstructive disease she can be d/c home.   2. HTN: still somewhat high in ER and elevated when she got anxious about her symptoms Per primary service can consider increasing norvasc to 10 mg  F/u with primary   3. HLD  Continue statin   4. DM:  Discussed low carb diet.  Target hemoglobin A1c is 6.5 or less.  Continue current medications.  5. GERD:  Consider d/c with protonix discussed diet issues    Signed: 09/28/2020 07/29/2020, 11:00 AM

## 2020-07-29 NOTE — Consult Note (Signed)
[facility] ER Consult Note   Vicki Perry ZDG:[id number] DOB: October 09, 1950 DOA: 07/29/2020  PCP: Lorre Munroe, NP Consultants:  Bjorn Pippin - cardiology; Ambruster - GI Patient coming from:  Home - lives with brother; NOK: Casimer Lanius, [phone]  Chief Complaint: CP  HPI: Vicki Perry is a 70 y.o. female with medical history significant of HTN; HLD; DM; and CAD presenting with CP.  She was doing her regular routine yesterday.  Before bed, she checked her BP and it was 148/; this was about normal for her.  She had an indigestion feeling.  About 1130, she woke up with severe indigestion feeling.  Her HR was rapid.  She laid back down and got up and checked her BP and it was 214.  Her R arm was cramping.  She had left-sided chest discomfort with pain in her R arm.  The pain is better but not gone now.  She thinks it got better with relaxing.  She had a similar episode in May 2021 and coronary CTA was done with minimal non-obstructive CAD; she has had BP medication adjustment since then with good results.    ED Course: Carryover, as per Dr. Loney Loh:  Patient with history of mild nonobstructive CAD per CTA done a year ago, hypertension, hyperlipidemia, diabetes presented to the ED with complaints of chest pain which has now resolved. Delta troponin 5 >10. Blood pressure elevated. Heart score 6 and admission requested for chest pain rule out. Patient was given aspirin in the ED.  Review of Systems: As per HPI; otherwise review of systems reviewed and negative.   Ambulatory Status:  Ambulates without assistance  COVID Vaccine Status:   Complete      Past Medical History:  Diagnosis Date  . Anemia   . CAD (coronary artery disease)   . Diabetes mellitus   . High cholesterol   . Hypertension          Past Surgical History:  Procedure Laterality Date  . CATARACT EXTRACTION, BILATERAL Bilateral 03/22/2018  . CESAREAN SECTION    . FOOT SURGERY Right    Bone  Spur removal  . TONSILLECTOMY    . WISDOM TOOTH EXTRACTION      Social History        Socioeconomic History  . Marital status: Single    Spouse name: Not on file  . Number of children: Not on file  . Years of education: Not on file  . Highest education level: Not on file  Occupational History  . Occupation: retired from [employer]  Tobacco Use  . Smoking status: Never Smoker  . Smokeless tobacco: Never Used  Substance and Sexual Activity  . Alcohol use: No  . Drug use: No  . Sexual activity: Not on file  Other Topics Concern  . Not on file  Social History Narrative  . Not on file   Social Determinants of Health   Financial Resource Strain: Not on file  Food Insecurity: Not on file  Transportation Needs: Not on file  Physical Activity: Not on file  Stress: Not on file  Social Connections: Not on file  Intimate Partner Violence: Not on file        Allergies  Allergen Reactions  . Codeine Nausea Only         Family History  Problem Relation Age of Onset  . Diabetes Mother   . Hypertension Mother   . Cancer Mother 7       Cervical  . Hypertension Father   .  Diabetes Maternal Grandmother   . Hypertension Sister   . Diabetes Sister   . Hypertension Brother   . Heart disease Neg Hx   . Stroke Neg Hx            Prior to Admission medications   Medication Sig Start Date End Date Taking? Authorizing Provider  amLODipine (NORVASC) 5 MG tablet Take 1 tablet (5 mg total) by mouth daily. 04/01/20  Yes [name], MD  amoxicillin (AMOXIL) 500 MG capsule Take 500 mg by mouth See admin instructions. Bid x 7 days 07/27/20  Yes [provider]  benzonatate (TESSALON) 100 MG capsule Take 1-2 capsules (100-200 mg total) by mouth 3 (three) times daily as needed. Patient taking differently: Take 100-200 mg by mouth 3 (three) times daily as needed for cough. 02/08/20  Yes Wallis Bamberg, PA-C  cetirizine (ZYRTEC ALLERGY) 10 MG tablet  Take 1 tablet (10 mg total) by mouth daily. Patient taking differently: Take 10 mg by mouth daily as needed for allergies. 02/08/20  Yes Wallis Bamberg, PA-C  cholecalciferol (VITAMIN D3) 25 MCG (1000 UT) tablet Take 1,000 Units by mouth daily.   Yes [provider]  glucose blood (ONETOUCH VERIO) test strip 1 each by Other route 2 (two) times a day. 09/04/18  Yes Baity, Salvadore Oxford, NP  hydrALAZINE (APRESOLINE) 50 MG tablet Take 50mg  in the AM, 50 mg in the afternoon and 25mg  at night Patient taking differently: Take 50 mg by mouth in the morning and at bedtime. 03/04/20  Yes , MD  Lancets [facility] ULTRASOFT) lancets Use as directed to test blood sugar once daily E11.9 08/24/16  Yes Baity, [facility], NP  losartan (COZAAR) 100 MG tablet TAKE 1 TABLET (100 MG TOTAL) BY MOUTH DAILY. MUST SCHEDULE PHYSICAL Patient taking differently: Take 100 mg by mouth daily. 06/01/20  Yes Baity, Salvadore Oxford, NP  metFORMIN (GLUCOPHAGE) 500 MG tablet TAKE 1 TABLET BY MOUTH DAILY WITH BREAKFAST. SCHEDULE PHYSICAL Patient taking differently: Take 500 mg by mouth daily with breakfast. 05/25/20  Yes Baity, Salvadore Oxford, NP  omeprazole (PRILOSEC) 20 MG capsule Take 1 capsule (20 mg total) by mouth daily as needed. Patient taking differently: Take 20 mg by mouth daily as needed (heart burn). 05/25/20  Yes Armbruster, Salvadore Oxford, MD  simvastatin (ZOCOR) 40 MG tablet TAKE 1 TABLET BY MOUTH DAILY AT 6 PM. Patient taking differently: Take 40 mg by mouth daily. 11/29/19  Yes Willaim Rayas, NP  spironolactone (ALDACTONE) 25 MG tablet Take 1 tablet (25 mg total) by mouth daily. 01/23/20  Yes Lorre Munroe, MD    Physical Exam:       Vitals:   07/29/20 0630 07/29/20 0700 07/29/20 0730 07/29/20 0800  BP: (!) 187/74 (!) 157/55 (!) 143/62 (!) 151/68  Pulse: 80 68 65 65  Resp: 18 19 17 11   Temp:      TempSrc:      SpO2: 99% 99% 98% 99%  Weight:      Height:           General:  Appears calm and comfortable and is in NAD  Eyes:  PERRL, EOMI, normal lids, iris  ENT:  grossly normal hearing, lips & tongue, mmm; appropriate dentition  Neck:  no LAD, masses or thyromegaly  Cardiovascular:  RRR, no m/r/g. No LE edema.   Respiratory:   CTA bilaterally with no wheezes/rales/rhonchi.  Normal respiratory effort.  Abdomen:  soft, NT, ND  Skin:  no rash or  induration seen on limited exam  Musculoskeletal:  grossly normal tone BUE/BLE, good ROM, no bony abnormality  Psychiatric:  grossly normal mood and affect, speech fluent and appropriate, AOx3  Neurologic:  CN 2-12 grossly intact, moves all extremities in coordinated fashion    Radiological Exams on Admission: Independently reviewed - see discussion in A/P where applicable   Imaging Results (Last 48 hours)  DG Chest 2 View  Result Date: 07/29/2020 CLINICAL DATA:  Chest pain EXAM: CHEST - 2 VIEW COMPARISON:  Chest x-ray 09/19/2019, CT cardiac 07/22/2019 FINDINGS: The heart size and mediastinal contours are unchanged. Aortic arch calcification. No focal consolidation. No pulmonary edema. No pleural effusion. No pneumothorax. No acute osseous abnormality. Mild degenerative changes of the right acromioclavicular joint. IMPRESSION: No active cardiopulmonary disease. Electronically Signed   By: Tish Frederickson M.D.   On: 07/29/2020 02:36     EKG: Independently reviewed.  NSR with rate 92; nonspecific ST changes with NSCSLT   Labs on Admission: I have personally reviewed the available labs and imaging studies at the time of the admission.  Pertinent labs:   Na++ 132 Glucose 112 BUN 11/Creatinine 1.23/GFR 47 - stable HS troponin 5, 10 WBC 8.0 Hgb 10.9   Assessment/Plan Principal Problem:   Chest pain Active Problems:   Hypertension   Hypercholesteremia   DM type 2 (diabetes mellitus, type 2) (HCC)   GERD (gastroesophageal reflux disease)   Chest pain -Patient with  substernal chest pressure with indigestion overnight, improved with rest -2/3 typical symptoms suggestive of atypical cardiac chest pain.  -CXR unremarkable.  -Initial cardiac HS troponin negative with negative delta. -EKG not indicative of acute ischemia.  -CTA in 5/21 with nonobstructive mild CAD -HEART pathway score is 6, indicating that the patient has an elevated risk score and requires further evaluation. -Planned to place observation status on telemetryto rule out ACS by overnight observation; however, cardiology has seen the patient and thinks she is appropriate for d/c to home at this time.  -Repeat EKG in AM -Start ASA 81 mg daily -Risk factor stratification with HgbA1c and FLP -Outpatient PCP/cardiology consultation  HTN -Continue Norvasc, hydralazine, Cozaar, Aldactone -Consider outpatient increase in dose of Norvasc to 10 mg daily  HLD -Continue statin  -Consider changing Zocor 40 to Lipitor 40 mg daily -Lipid panel pending  DM -Check A1c -Resume Glucophage  GERD -Consider changing prn 20 mg prilosec to standing 40 mg Protonix daily   Note: This patient has been tested and is pending for the novel coronavirus COVID-19. She has been fully vaccinated against COVID-19.  Thank you for this interesting consult.  The patient has been cleared for d/c by cardiology and so TR[facility] will sign off on the patient at this time.   Jonah Blue MD [facility]

## 2020-07-31 ENCOUNTER — Other Ambulatory Visit: Payer: Self-pay | Admitting: Internal Medicine

## 2020-10-01 ENCOUNTER — Encounter: Payer: Self-pay | Admitting: Internal Medicine

## 2020-10-01 ENCOUNTER — Other Ambulatory Visit: Payer: Self-pay

## 2020-10-01 ENCOUNTER — Ambulatory Visit: Payer: Medicare PPO | Admitting: Internal Medicine

## 2020-10-01 VITALS — BP 134/62 | HR 77 | Temp 98.2°F | Resp 18 | Ht 61.0 in | Wt 145.2 lb

## 2020-10-01 DIAGNOSIS — N1831 Chronic kidney disease, stage 3a: Secondary | ICD-10-CM | POA: Insufficient documentation

## 2020-10-01 DIAGNOSIS — E785 Hyperlipidemia, unspecified: Secondary | ICD-10-CM

## 2020-10-01 DIAGNOSIS — E1122 Type 2 diabetes mellitus with diabetic chronic kidney disease: Secondary | ICD-10-CM

## 2020-10-01 DIAGNOSIS — E1169 Type 2 diabetes mellitus with other specified complication: Secondary | ICD-10-CM

## 2020-10-01 DIAGNOSIS — Z0000 Encounter for general adult medical examination without abnormal findings: Secondary | ICD-10-CM | POA: Diagnosis not present

## 2020-10-01 DIAGNOSIS — N183 Chronic kidney disease, stage 3 unspecified: Secondary | ICD-10-CM | POA: Insufficient documentation

## 2020-10-01 DIAGNOSIS — I10 Essential (primary) hypertension: Secondary | ICD-10-CM

## 2020-10-01 DIAGNOSIS — Z0001 Encounter for general adult medical examination with abnormal findings: Secondary | ICD-10-CM | POA: Insufficient documentation

## 2020-10-01 NOTE — Patient Instructions (Signed)
We will see you back in about 6 months. Think about getting the shingles vaccine. Get the flu and covid-19 vaccine in the fall.

## 2020-10-01 NOTE — Assessment & Plan Note (Signed)
BP at goal on current regimen of amlodipine 5 mg daily, losartan 100 mg daily, hydralazine 50 mg TID, spironolactone 25 mg daily. Recent CMP without indication for change.

## 2020-10-01 NOTE — Assessment & Plan Note (Signed)
Recent lipid panel at goal at the ER. Taking simvastatin 40 mg daily and will continue.

## 2020-10-01 NOTE — Assessment & Plan Note (Signed)
Foot exam done. Recent HgA1c 6.0 with metformin 500 mg daily and will continue. Will get records from eye exam. Is on ARB and statin.

## 2020-10-01 NOTE — Assessment & Plan Note (Signed)
Discussion today about GFR and trend for her. Has DM and HTN which are well controlled now but have been not well controlled in the past. She is interested in close monitoring as family member on dialysis.

## 2020-10-01 NOTE — Progress Notes (Signed)
   Subjective:   Patient ID: Vicki Perry, female    DOB: [birth year], 70 y.o.   MRN: [id number]  HPI The patient is a 70 YO female coming in for transfer of care and physical.   PMH, FMH, social history reviewed and updated  Review of Systems  Constitutional: Negative.   HENT: Negative.    Eyes: Negative.   Respiratory:  Negative for cough, chest tightness and shortness of breath.   Cardiovascular:  Negative for chest pain, palpitations and leg swelling.  Gastrointestinal:  Negative for abdominal distention, abdominal pain, constipation, diarrhea, nausea and vomiting.  Musculoskeletal: Negative.   Skin: Negative.   Neurological: Negative.   Psychiatric/Behavioral: Negative.     Objective:  Physical Exam Constitutional:      Appearance: She is well-developed.  HENT:     Head: Normocephalic and atraumatic.  Cardiovascular:     Rate and Rhythm: Normal rate and regular rhythm.  Pulmonary:     Effort: Pulmonary effort is normal. No respiratory distress.     Breath sounds: Normal breath sounds. No wheezing or rales.  Abdominal:     General: Bowel sounds are normal. There is no distension.     Palpations: Abdomen is soft.     Tenderness: There is no abdominal tenderness. There is no rebound.  Musculoskeletal:     Cervical back: Normal range of motion.  Skin:    General: Skin is warm and dry.     Comments: Foot exam done.  Neurological:     Mental Status: She is alert and oriented to person, place, and time.     Coordination: Coordination normal.    Vitals:   10/01/20 0815  BP: 134/62  Pulse: 77  Resp: 18  Temp: 98.2 F (36.8 C)  TempSrc: Oral  SpO2: 99%  Weight: 145 lb 3.2 oz (65.9 kg)  Height: 5\' 1"  (1.549 m)    This visit occurred during the SARS-CoV-2 public health emergency.  Safety protocols were in place, including screening questions prior to the visit, additional usage of staff PPE, and extensive cleaning of exam room while observing appropriate contact  time as indicated for disinfecting solutions.   Assessment & Plan:

## 2020-10-01 NOTE — Assessment & Plan Note (Signed)
Flu shot yearly. Covid-19 discussed 4th shot. Pneumonia complete. Shingrix advised to get at pharmacy. Tetanus due 2025. Cologuard due 2024. Mammogram due 2024, pap smear aged out and dexa due 2023. Counseled about sun safety and mole surveillance. Counseled about the dangers of distracted driving. Given 10 year screening recommendations.

## 2020-10-08 ENCOUNTER — Encounter: Payer: Medicare PPO | Admitting: Internal Medicine

## 2020-12-01 ENCOUNTER — Telehealth: Payer: Self-pay | Admitting: Internal Medicine

## 2020-12-01 NOTE — Telephone Encounter (Signed)
LVM for pt to rtn my call to schedule AWV with [facility]. Please schedule this appt if pt calls the office.  °

## 2020-12-02 ENCOUNTER — Other Ambulatory Visit: Payer: Self-pay | Admitting: Internal Medicine

## 2020-12-02 ENCOUNTER — Other Ambulatory Visit: Payer: Self-pay | Admitting: Family

## 2020-12-02 DIAGNOSIS — N183 Chronic kidney disease, stage 3 unspecified: Secondary | ICD-10-CM

## 2020-12-02 DIAGNOSIS — E1122 Type 2 diabetes mellitus with diabetic chronic kidney disease: Secondary | ICD-10-CM

## 2020-12-07 ENCOUNTER — Other Ambulatory Visit: Payer: Self-pay

## 2020-12-07 NOTE — Progress Notes (Signed)
Pt asked asked that her Metformin be refilled. I was able refill medication.

## 2020-12-13 NOTE — Progress Notes (Signed)
Cardiology Office Note:    Date:  12/15/2020   ID:  Vicki Perry, DOB 1950/12/27, MRN [id number]  PCP:  Myrlene Broker, MD  Cardiologist:  None  Electrophysiologist:  None   Referring MD: Lorre Munroe, NP   Chief Complaint  Patient presents with   Hypertension     History of Present Illness:    Vicki Perry is a 70 y.o. female with a hx of hypertension, hyperlipidemia, diabetes who is referred by Nicki Reaper, NP for evaluation of hypertension and chest pain.  Reports has been having left-sided chest pain.  Describes as dull aching pain.  Has occurred 3-4 times in the last few weeks.  Can occur with exertion and last for up to an hour.  Was walking 3 miles per day, but has not been walking since November.  Denies any chest pain when she was walking in November.  States that she has gained 10 pounds since she stopped working.  Also periodically has left arm pain.  Checks BP at home, has been up to 170s 2 weeks ago but recently has been in the 130s.  No smoking history.  Brother had CVA.  TTE 07/01/2019 (study has been read but not transferring to Epic): normal biventricular function, Grade 2 diastolic dysfunction, no significant valvular disease.  Coronary CTA on 07/22/2019 showed calcium score 71 (72nd percentile), minimal nonobstructive CAD.  Since last clinic visit, she reports that she has been doing well.  Home BP log shows BP 120s to 130s over 50s to 60s.  Has not taken BP meds today.  Reports chest pain has improved since she started Pepcid.  Following with GI, considering EGD.  She denies any dyspnea, syncope, lower extremity edema, or palpitations.  Reports intermittent lightheadedness but denies any syncope.  She walks at the [facility] twice weekly for 2 miles.    Past Medical History:  Diagnosis Date   Anemia    CAD (coronary artery disease)    Diabetes mellitus    High cholesterol    Hypertension     Past Surgical History:  Procedure Laterality Date   CATARACT  EXTRACTION, BILATERAL Bilateral 03/22/2018   CESAREAN SECTION     FOOT SURGERY Right    Bone Spur removal   TONSILLECTOMY     WISDOM TOOTH EXTRACTION      Current Medications: Current Meds  Medication Sig   amLODipine (NORVASC) 5 MG tablet Take 1 tablet (5 mg total) by mouth daily.   cholecalciferol (VITAMIN D3) 25 MCG (1000 UT) tablet Take 1,000 Units by mouth daily.   Famotidine (PEPCID PO) Take 1 tablet by mouth as needed.   glucose blood (ONETOUCH VERIO) test strip 1 each by Other route 2 (two) times a day.   hydrALAZINE (APRESOLINE) 50 MG tablet Take 50mg  in the AM, 50 mg in the afternoon and 25mg  at night (Patient taking differently: Take 50 mg by mouth 3 (three) times daily.)   Lancets (ONETOUCH ULTRASOFT) lancets Use as directed to test blood sugar once daily E11.9   losartan (COZAAR) 100 MG tablet Take 1 tablet (100 mg total) by mouth daily.   metFORMIN (GLUCOPHAGE) 500 MG tablet TAKE 1 TABLET BY MOUTH DAILY WITH BREAKFAST. SCHEDULE PHYSICAL   simvastatin (ZOCOR) 40 MG tablet TAKE 1 TABLET BY MOUTH DAILY AT 6 PM. (Patient taking differently: Take 40 mg by mouth daily.)   spironolactone (ALDACTONE) 25 MG tablet Take 1 tablet (25 mg total) by mouth daily.     Allergies:  Codeine   Social History   Socioeconomic History   Marital status: Single    Spouse name: Not on file   Number of children: Not on file   Years of education: Not on file   Highest education level: Not on file  Occupational History   Occupation: retired from [employer]  Tobacco Use   Smoking status: Never   Smokeless tobacco: Never  Substance and Sexual Activity   Alcohol use: No   Drug use: No   Sexual activity: Not on file  Other Topics Concern   Not on file  Social History Narrative   Not on file   Social Determinants of Health   Financial Resource Strain: Not on file  Food Insecurity: Not on file  Transportation Needs: Not on file  Physical Activity: Not on file  Stress: Not on file  Social  Connections: Not on file     Family History: The patient's family history includes Cancer (age of onset: 12) in her mother; Diabetes in her maternal grandmother, mother, and sister; Hypertension in her brother, father, mother, and sister. There is no history of Heart disease or Stroke.  ROS:   Please see the history of present illness.     All other systems reviewed and are negative.  EKGs/Labs/Other Studies Reviewed:    The following studies were reviewed today:   EKG:  EKG is ordered today.  The ekg ordered demonstrates normal sinus rhythm, rate 63, no ST/T abnormalities  Recent Labs: 07/29/2020: BUN 11; Creatinine, Ser 1.23; Hemoglobin 10.9; Platelets 305; Potassium 3.8; Sodium 132  Recent Lipid Panel    Component Value Date/Time   CHOL 111 07/29/2020 1015   CHOL 114 09/05/2019 0953   TRIG 32 07/29/2020 1015   HDL 46 07/29/2020 1015   HDL 44 09/05/2019 0953   CHOLHDL 2.4 07/29/2020 1015   VLDL 6 07/29/2020 1015   LDLCALC 59 07/29/2020 1015   LDLCALC 58 09/05/2019 0953    Physical Exam:    VS:  BP (!) 187/69   Pulse 63   Ht 5\' 1"  (1.549 m)   Wt 144 lb 9.6 oz (65.6 kg)   SpO2 98%   BMI 27.32 kg/m     Wt Readings from Last 3 Encounters:  12/15/20 144 lb 9.6 oz (65.6 kg)  10/01/20 145 lb 3.2 oz (65.9 kg)  07/29/20 144 lb 13.5 oz (65.7 kg)     GEN:  Well nourished, well developed in no acute distress HEENT: Normal NECK: No JVD CARDIAC: RRR, no murmurs, rubs, gallops RESPIRATORY:  Clear to auscultation without rales, wheezing or rhonchi  ABDOMEN: Soft, non-tender, non-distended MUSCULOSKELETAL:  No edema; No deformity  SKIN: Warm and dry NEUROLOGIC:  Alert and oriented x 3 PSYCHIATRIC:  Normal affect   ASSESSMENT:    1. Coronary artery disease involving native coronary artery of native heart without angina pectoris   2. Essential hypertension   3. Hyperlipidemia, unspecified hyperlipidemia type     PLAN:    CAD: Coronary CTA on 07/22/2019 showed calcium  score 71 (72nd percentile), minimal nonobstructive CAD. Continues to have atypical chest pain, suspect GI etiology as occurs after eating.  Echocardiogram 07/01/19 showed normal biventricular function, Grade 2 diastolic dysfunction, no significant valvular disease. -LDL at goal less than 70 (59 on 07/29/2020) on simvastatin 40 mg daily.   -Referred to GI for evaluation.  Reports has improved with PPI  Hypertension: Currently on losartan 100 mg daily, amlodipine 5 mg daily, hydralazine 50 mg twice daily, spironolactone 25  mg daily.  Developed hyponatremia with hydrochlorothiazide, edema with amlodipine 10 mg daily.  BP elevated in clinic today (187/69) but improved on recheck (154/55).  She has not taken her BP meds today.  Home BP log shows good control (120s 130s over 50s to 60s) and she has had her home monitor calibrated.  Will check BMP and continue current regimen  Hyperlipidemia: On simvastatin 40 mg daily.  LDL 59 on 07/29/20  Type 2 diabetes: A1c 6.0 on 07/29/20.  On Metformin  RTC in 1 year   Medication Adjustments/Labs and Tests Ordered: Current medicines are reviewed at length with the patient today.  Concerns regarding medicines are outlined above.  Orders Placed This Encounter  Procedures   Basic metabolic panel   Magnesium   EKG 12-Lead    No orders of the defined types were placed in this encounter.   Patient Instructions  Medication Instructions:  Your physician recommends that you continue on your current medications as directed. Please refer to the Current Medication list given to you today.  *If you need a refill on your cardiac medications before your next appointment, please call your pharmacy*   Lab Work: BMET, Mag today  If you have labs (blood work) drawn today and your tests are completely normal, you will receive your results only by: MyChart Message (if you have MyChart) OR A paper copy in the mail If you have any lab test that is abnormal or we need to  change your treatment, we will call you to review the results.  Follow-Up: At [facility], you and your health needs are our priority.  As part of our continuing mission to provide you with exceptional heart care, we have created designated Provider Care Teams.  These Care Teams include your primary Cardiologist (physician) and Advanced Practice Providers (APPs -  Physician Assistants and Nurse Practitioners) who all work together to provide you with the care you need, when you need it.  We recommend signing up for the patient portal called "MyChart".  Sign up information is provided on this After Visit Summary.  MyChart is used to connect with patients for Virtual Visits (Telemedicine).  Patients are able to view lab/test results, encounter notes, upcoming appointments, etc.  Non-urgent messages can be sent to your provider as well.   To learn more about what you can do with MyChart, go to [email or web address].    Your next appointment:   12 month(s)  The format for your next appointment:   In Person  Provider:   Epifanio Lesches, MD    Signed, [name], MD  12/15/2020 10:49 AM    [facility]

## 2020-12-15 ENCOUNTER — Other Ambulatory Visit: Payer: Self-pay

## 2020-12-15 ENCOUNTER — Ambulatory Visit (INDEPENDENT_AMBULATORY_CARE_PROVIDER_SITE_OTHER): Payer: Medicare PPO | Admitting: Cardiology

## 2020-12-15 ENCOUNTER — Encounter: Payer: Self-pay | Admitting: Cardiology

## 2020-12-15 VITALS — BP 187/69 | HR 63 | Ht 61.0 in | Wt 144.6 lb

## 2020-12-15 DIAGNOSIS — E785 Hyperlipidemia, unspecified: Secondary | ICD-10-CM | POA: Diagnosis not present

## 2020-12-15 DIAGNOSIS — I10 Essential (primary) hypertension: Secondary | ICD-10-CM | POA: Diagnosis not present

## 2020-12-15 DIAGNOSIS — I2510 Atherosclerotic heart disease of native coronary artery without angina pectoris: Secondary | ICD-10-CM

## 2020-12-15 LAB — BASIC METABOLIC PANEL
BUN/Creatinine Ratio: 9 — ABNORMAL LOW (ref 12–28)
BUN: 13 mg/dL (ref 8–27)
CO2: 21 mmol/L (ref 20–29)
Calcium: 9.8 mg/dL (ref 8.7–10.3)
Chloride: 92 mmol/L — ABNORMAL LOW (ref 96–106)
Creatinine, Ser: 1.38 mg/dL — ABNORMAL HIGH (ref 0.57–1.00)
Glucose: 105 mg/dL — ABNORMAL HIGH (ref 70–99)
Potassium: 5.6 mmol/L — ABNORMAL HIGH (ref 3.5–5.2)
Sodium: 128 mmol/L — ABNORMAL LOW (ref 134–144)
eGFR: 41 mL/min/{1.73_m2} — ABNORMAL LOW (ref >59)

## 2020-12-15 LAB — MAGNESIUM: Magnesium: 2 mg/dL (ref 1.6–2.3)

## 2020-12-15 NOTE — Patient Instructions (Signed)
Medication Instructions:  Your physician recommends that you continue on your current medications as directed. Please refer to the Current Medication list given to you today.  *If you need a refill on your cardiac medications before your next appointment, please call your pharmacy*  Lab Work: BMET, Mag today  If you have labs (blood work) drawn today and your tests are completely normal, you will receive your results only by: . MyChart Message (if you have MyChart) OR . A paper copy in the mail If you have any lab test that is abnormal or we need to change your treatment, we will call you to review the results.  Follow-Up: At [facility], you and your health needs are our priority.  As part of our continuing mission to provide you with exceptional heart care, we have created designated Provider Care Teams.  These Care Teams include your primary Cardiologist (physician) and Advanced Practice Providers (APPs -  Physician Assistants and Nurse Practitioners) who all work together to provide you with the care you need, when you need it.  We recommend signing up for the patient portal called "MyChart".  Sign up information is provided on this After Visit Summary.  MyChart is used to connect with patients for Virtual Visits (Telemedicine).  Patients are able to view lab/test results, encounter notes, upcoming appointments, etc.  Non-urgent messages can be sent to your provider as well.   To learn more about what you can do with MyChart, go to [email or web address].    Your next appointment:   12 month(s)  The format for your next appointment:   In Person  Provider:   Christopher Schumann, MD     

## 2020-12-17 ENCOUNTER — Other Ambulatory Visit: Payer: Self-pay | Admitting: *Deleted

## 2020-12-17 DIAGNOSIS — E875 Hyperkalemia: Secondary | ICD-10-CM

## 2020-12-17 DIAGNOSIS — I10 Essential (primary) hypertension: Secondary | ICD-10-CM

## 2020-12-17 DIAGNOSIS — Z79899 Other long term (current) drug therapy: Secondary | ICD-10-CM

## 2020-12-17 MED ORDER — CARVEDILOL 6.25 MG PO TABS: 6.2500 mg | ORAL_TABLET | Freq: Two times a day (BID) | ORAL | 3 refills | Status: DC

## 2020-12-21 ENCOUNTER — Telehealth: Payer: Self-pay | Admitting: *Deleted

## 2020-12-21 ENCOUNTER — Other Ambulatory Visit: Payer: Self-pay

## 2020-12-21 DIAGNOSIS — Z79899 Other long term (current) drug therapy: Secondary | ICD-10-CM | POA: Diagnosis not present

## 2020-12-21 DIAGNOSIS — E875 Hyperkalemia: Secondary | ICD-10-CM | POA: Diagnosis not present

## 2020-12-21 DIAGNOSIS — I10 Essential (primary) hypertension: Secondary | ICD-10-CM | POA: Diagnosis not present

## 2020-12-21 LAB — BASIC METABOLIC PANEL
BUN/Creatinine Ratio: 10 — ABNORMAL LOW (ref 12–28)
BUN: 12 mg/dL (ref 8–27)
CO2: 20 mmol/L (ref 20–29)
Calcium: 9.6 mg/dL (ref 8.7–10.3)
Chloride: 90 mmol/L — ABNORMAL LOW (ref 96–106)
Creatinine, Ser: 1.23 mg/dL — ABNORMAL HIGH (ref 0.57–1.00)
Glucose: 93 mg/dL (ref 70–99)
Potassium: 5.3 mmol/L — ABNORMAL HIGH (ref 3.5–5.2)
Sodium: 125 mmol/L — ABNORMAL LOW (ref 134–144)
eGFR: 47 mL/min/{1.73_m2} — ABNORMAL LOW (ref >59)

## 2020-12-21 LAB — MAGNESIUM: Magnesium: 1.9 mg/dL (ref 1.6–2.3)

## 2020-12-21 NOTE — Telephone Encounter (Signed)
Patient in office for lab work, request to speak to nurse.     Patient states she is having issues with the new medication (Coreg) that was started last week.  She states she has had nausea, dizziness, feels like her legs and ankles are "drawing up" or tight.  BP has been in the 130s systolic and states her HR has increased to 80s.   States HR is normally in the 60s.    She did not take the carvedilol this morning and no symptoms currently.      Will see if we can move up pharmD appt Aware to continue to monitor BP/HR 2 times daily.     Routed to MD to review.

## 2020-12-21 NOTE — Telephone Encounter (Signed)
I spoke with patient. Her BP have been as follows: 137/57 HR 68  134/57  135/56 136/54 149/49 132/56  135/52 120/61  128/56  Stopped eating bananas. Looks like her K is still a little high but improved. I did review foods high in potassium and she is eating a fair about of them. She will try to decrease them. I advised that I would have Dr. Bjorn Pippin review labs as well. She is on losartan. Would hate to have to stop as we are very limited on BP medication options, but we might have to if K doesn't come down. Renal function appears to be around baseline. I will see in her BP clinic on 10/11. At this point, no need to see her sooner.

## 2020-12-22 NOTE — Telephone Encounter (Signed)
Agree with plan 

## 2020-12-27 NOTE — Progress Notes (Signed)
Patient ID: KYTZIA GIENGER                 DOB: 09/07/1950                      MRN: [id number]     HPI: Vicki Perry is a 70 y.o. female referred by Dr. Bjorn Pippin to HTN clinic. PMH is significant for T2DM, HLD, HTN.  Patient was last seen by Dr. Bjorn Pippin on 12/15/20 for HTN and CAD evaluation. BP was elevated in clinic 154/55 after not taking meds that morning but home BP log showed good control. Labs showed elevated potassium 5.6, low sodium 128, and Scr 1.38 (Baseline 1.23). Dr. Bjorn Pippin started carvedilol 6.25 mg BID, stopped spironolactone, and referred patient to HTN clinic with a repeat BMet.  Repeat labs on 10/4 showed near baseline renal function 1.23,  elevated potassium 5.3 and sodium 125. PharmD reviewed labs with patient via phone and she states she eats a lot of potassium containing-foods. Counseled to adopt a low-potassium diet and follow-up with pharmacy as scheduled with repeat Bmet same day. BP is within goal on losartan, amlodipine and hydralazine, but plan is to discontinue losartan if potassium does not improve.  Patient presents today for follow-up. She has brought in all her medications for review. Reports adhering to low-potassium diet since phone call. Recent home BPs are stable 120-130s/60s and she is tolerating all her medications. She is regularly going to the [facility].  Current HTN meds: losartan 100mg  daily, hydralazine 50mg  three times a day, amlodipine 5mg  daily   Previously tried: HCTZ 25 mg daily (hyponatremia), amlodipine 10 mg daily (edema), hydralazine 10mg  and 25mg  BID, hydralazine 50 mg TID (made her feet "tingle"), lisinopril 20mg  & 40 mg daily, carvedilol 6.25mg  (nausea, dizziness, feels like her legs and ankles are "drawing up" or tight) BP goal: <130/80  Family History: HTN and DM (mother), HTN (father)  Diet: Patient has started diet low in potassium after most recent labs (stopped eating bananas regularly), already eats a diet low in salt - Avoids  processed meats, excess saly - Eats fresh vegetables regularly - down to 6 cups of water instead of 8 - has cut back on coffee (1/2 cup) - has not had caffeine beyond  Drinks: water and tea with lemon, occasionally artificial sweeteners   Exercise: Regularly exercises at the [facility], lifts free weights, completed [employer] prep class-  Walks at least a mile per day everyday - gets wood for fireplace - clean windows, refrigerator  - completed [employer] prep class   Home BP readings:  120-130s/60-70s HR 60s-70s (10/6) - 128/64, 128/60, 126/61 (10/7) - 139/57, 129/59, 118/55 (10/9) - 131/49, 123/56 (10/8) - 136/54, 124/57 (10/10) - 131/59, 128/59, 132/61 (10/11) - 128/59, 124/54 Wt Readings from Last 3 Encounters:  12/15/20 144 lb 9.6 oz (65.6 kg)  10/01/20 145 lb 3.2 oz (65.9 kg)  07/29/20 144 lb 13.5 oz (65.7 kg)   BP Readings from Last 3 Encounters:  12/28/20 (!) 124/54  12/15/20 (!) 187/69  10/01/20 134/62   Pulse Readings from Last 3 Encounters:  12/28/20 70  12/15/20 63  10/01/20 77    Renal function: Estimated Creatinine Clearance: 35.8 mL/min (A) (by C-G formula based on SCr of 1.23 mg/dL (H)).  Past Medical History:  Diagnosis Date   Anemia    CAD (coronary artery disease)    Diabetes mellitus    High cholesterol    Hypertension     Current Outpatient Medications  on File Prior to Visit  Medication Sig Dispense Refill   amLODipine (NORVASC) 5 MG tablet Take 1 tablet (5 mg total) by mouth daily. 90 tablet 3   Calcium Carb-Cholecalciferol (CALCIUM 500/D) 500-400 MG-UNIT CHEW Chew 1 tablet by mouth daily.     Famotidine (PEPCID PO) Take 1 tablet by mouth as needed.     hydrALAZINE (APRESOLINE) 50 MG tablet Take 50 mg by mouth 3 (three) times daily.     losartan (COZAAR) 100 MG tablet Take 1 tablet (100 mg total) by mouth daily. 90 tablet 1   metFORMIN (GLUCOPHAGE) 500 MG tablet TAKE 1 TABLET BY MOUTH DAILY WITH BREAKFAST. SCHEDULE PHYSICAL 90 tablet 0   simvastatin  (ZOCOR) 40 MG tablet TAKE 1 TABLET BY MOUTH DAILY AT 6 PM. (Patient taking differently: Take 40 mg by mouth daily.) 90 tablet 3   glucose blood (ONETOUCH VERIO) test strip 1 each by Other route 2 (two) times a day. 200 each 2   Lancets (ONETOUCH ULTRASOFT) lancets Use as directed to test blood sugar once daily E11.9 100 each 12   No current facility-administered medications on file prior to visit.    Allergies  Allergen Reactions   Codeine Nausea Only    Blood pressure (!) 124/54, pulse 70, SpO2 99 %.   Assessment/Plan:  1. Hypertension - BP is at goal (<130/80) in clinic today. Repeat BMP drawn to confirm potassium has decreased. Continue losartan 100mg  daily, hydralazine 50mg  three times a day, amlodipine 5mg  daily. Encouraged patient to continue regular exercise with wellness classes at the [facility].  Counseled patient on eating foods/recipes low in potassium. Patient is motivated to adopt dietary changes in order to stay on current HTN regimen.   Thank you   Pharm D. Candidate  [facility] [address] [place], Hermenia Fiscal.D, BCPS, CPP [facility]  [address], [place], [place] [address]  Phone: [phone]; Fax: [phone]

## 2020-12-28 ENCOUNTER — Other Ambulatory Visit: Payer: Self-pay

## 2020-12-28 ENCOUNTER — Ambulatory Visit: Payer: Medicare PPO | Admitting: Pharmacist

## 2020-12-28 VITALS — BP 124/54 | HR 70

## 2020-12-28 DIAGNOSIS — I10 Essential (primary) hypertension: Secondary | ICD-10-CM

## 2020-12-28 LAB — BASIC METABOLIC PANEL
BUN/Creatinine Ratio: 10 — ABNORMAL LOW (ref 12–28)
BUN: 11 mg/dL (ref 8–27)
CO2: 22 mmol/L (ref 20–29)
Calcium: 9.4 mg/dL (ref 8.7–10.3)
Chloride: 93 mmol/L — ABNORMAL LOW (ref 96–106)
Creatinine, Ser: 1.14 mg/dL — ABNORMAL HIGH (ref 0.57–1.00)
Glucose: 101 mg/dL — ABNORMAL HIGH (ref 70–99)
Potassium: 4.8 mmol/L (ref 3.5–5.2)
Sodium: 129 mmol/L — ABNORMAL LOW (ref 134–144)
eGFR: 51 mL/min/{1.73_m2} — ABNORMAL LOW (ref >59)

## 2020-12-28 NOTE — Patient Instructions (Addendum)
Eat, Drink, and Be Healthy: The [employer] Guide to Healthy Eating Paperback - December 07, 2015  Continue losartan 100mg  daily, hydralazine 50mg  three times a day and amlodipine 5mg  daily   I will call you tomorrow with your lab results  Call me at [phone] with any questions

## 2020-12-29 ENCOUNTER — Telehealth: Payer: Self-pay | Admitting: Pharmacist

## 2020-12-29 DIAGNOSIS — E871 Hypo-osmolality and hyponatremia: Secondary | ICD-10-CM

## 2020-12-29 MED ORDER — SIMVASTATIN 40 MG PO TABS: 40.0000 mg | ORAL_TABLET | Freq: Every day | ORAL | 3 refills | Status: DC

## 2020-12-29 NOTE — Telephone Encounter (Signed)
Reviewed results with patient. K and Scr much better. Na still low. I have sent message to Dr. Bjorn Pippin about Na. Holding losartan vs decreasing fluids and increasing sodium in diet. Will call patient back once discussed with Dr. Bjorn Pippin.

## 2020-12-30 NOTE — Addendum Note (Signed)
Addended by: Malena Peer D on: 12/30/2020 04:57 PM   Modules accepted: Orders

## 2020-12-30 NOTE — Telephone Encounter (Signed)
Called pt, she will come in for urine sodium and urine osmolality tomorrow.  Patient asked about her simvastatin 40mg  and her amlodipine. States her pharmacist told her there was an interaction. I advised she take 20mg  of simvastatin until she runs out and then we can change the statin.

## 2020-12-31 ENCOUNTER — Other Ambulatory Visit: Payer: Self-pay

## 2020-12-31 ENCOUNTER — Other Ambulatory Visit: Payer: Medicare PPO | Admitting: *Deleted

## 2020-12-31 DIAGNOSIS — E871 Hypo-osmolality and hyponatremia: Secondary | ICD-10-CM | POA: Diagnosis not present

## 2021-01-01 LAB — OSMOLALITY, URINE: Osmolality, Ur: 117 mOsmol/kg

## 2021-01-01 LAB — SODIUM, URINE, RANDOM: Sodium, Ur: 28 mmol/L

## 2021-01-03 ENCOUNTER — Telehealth: Payer: Self-pay | Admitting: Cardiology

## 2021-01-03 DIAGNOSIS — Z79899 Other long term (current) drug therapy: Secondary | ICD-10-CM

## 2021-01-03 NOTE — Telephone Encounter (Signed)
Pt updated and verbalized understanding. Lab order placed.   [name], MD  01/02/2021  9:59 PM EDT Back to Top    Recommend fluid restriction, goal less than 1500 mL per day and repeat BMET in 1 week, would also check TSH/free T4

## 2021-01-03 NOTE — Telephone Encounter (Signed)
Pt is calling back for results. Please advise pt further

## 2021-01-04 ENCOUNTER — Other Ambulatory Visit: Payer: Self-pay | Admitting: Cardiology

## 2021-01-07 ENCOUNTER — Ambulatory Visit: Payer: Medicare PPO

## 2021-01-07 ENCOUNTER — Other Ambulatory Visit: Payer: Self-pay

## 2021-01-10 DIAGNOSIS — Z79899 Other long term (current) drug therapy: Secondary | ICD-10-CM | POA: Diagnosis not present

## 2021-01-11 ENCOUNTER — Telehealth: Payer: Self-pay | Admitting: Cardiology

## 2021-01-11 LAB — BASIC METABOLIC PANEL
BUN/Creatinine Ratio: 10 — ABNORMAL LOW (ref 12–28)
BUN: 13 mg/dL (ref 8–27)
CO2: 23 mmol/L (ref 20–29)
Calcium: 9.3 mg/dL (ref 8.7–10.3)
Chloride: 99 mmol/L (ref 96–106)
Creatinine, Ser: 1.25 mg/dL — ABNORMAL HIGH (ref 0.57–1.00)
Glucose: 96 mg/dL (ref 70–99)
Potassium: 4.9 mmol/L (ref 3.5–5.2)
Sodium: 135 mmol/L (ref 134–144)
eGFR: 46 mL/min/{1.73_m2} — ABNORMAL LOW (ref >59)

## 2021-01-11 LAB — TSH+FREE T4
Free T4: 1.17 ng/dL (ref 0.82–1.77)
TSH: 1.13 u[IU]/mL (ref 0.450–4.500)

## 2021-01-11 NOTE — Telephone Encounter (Signed)
Spoke to patient she was calling for lab results done yesterday 10/24.Advised results not available.Advised Dr.Schumann is out of office.Advised once Dr.Schumann has reviewed his RN will call with results.I will make her aware.

## 2021-01-11 NOTE — Telephone Encounter (Signed)
Pt is calling for lab results... please advise

## 2021-01-14 ENCOUNTER — Telehealth: Payer: Self-pay | Admitting: Cardiology

## 2021-01-14 NOTE — Telephone Encounter (Signed)
Called patient no answer.Left message on personal voice mail to call back. 

## 2021-01-14 NOTE — Telephone Encounter (Signed)
Patient is returning call to discuss lab results. 

## 2021-01-14 NOTE — Telephone Encounter (Signed)
See result note.  

## 2021-01-17 NOTE — Telephone Encounter (Signed)
Pt updated with lab results and verbalized understanding.   Vicki Ishikawa, MD  01/11/2021  8:32 PM EDT     Sodium has normalized.  Normal thyroid function

## 2021-01-17 NOTE — Telephone Encounter (Signed)
Patient returned call

## 2021-01-31 ENCOUNTER — Ambulatory Visit (INDEPENDENT_AMBULATORY_CARE_PROVIDER_SITE_OTHER): Payer: Medicare PPO

## 2021-01-31 ENCOUNTER — Other Ambulatory Visit: Payer: Self-pay

## 2021-01-31 VITALS — BP 128/68 | HR 68 | Temp 98.2°F | Ht 61.0 in | Wt 143.4 lb

## 2021-01-31 DIAGNOSIS — Z23 Encounter for immunization: Secondary | ICD-10-CM

## 2021-01-31 DIAGNOSIS — Z0000 Encounter for general adult medical examination without abnormal findings: Secondary | ICD-10-CM | POA: Diagnosis not present

## 2021-01-31 NOTE — Progress Notes (Addendum)
Subjective:   Vicki Perry is a 70 y.o. female who presents for Medicare Annual (Subsequent) preventive examination.  Review of Systems     Cardiac Risk Factors include: advanced age (>51men, >63 women);diabetes mellitus;dyslipidemia;hypertension;family history of premature cardiovascular disease     Objective:    Today's Vitals   01/31/21 1038  BP: 128/68  Pulse: 68  Temp: 98.2 F (36.8 C)  SpO2: 98%  Weight: 143 lb 6.4 oz (65 kg)  Height: 5\' 1"  (1.549 m)  PainSc: 0-No pain   Body mass index is 27.1 kg/m.  Advanced Directives 01/31/2021 09/19/2019  Does Patient Have a Medical Advance Directive? No No  Would patient like information on creating a medical advance directive? Yes (MAU/Ambulatory/Procedural Areas - Information given) Yes (ED - Information included in AVS)    Current Medications (verified) Outpatient Encounter Medications as of 01/31/2021  Medication Sig   amLODipine (NORVASC) 5 MG tablet Take 1 tablet (5 mg total) by mouth daily.   Calcium Carb-Cholecalciferol (CALCIUM 500/D) 500-400 MG-UNIT CHEW Chew 1 tablet by mouth daily.   Famotidine (PEPCID PO) Take 1 tablet by mouth as needed.   glucose blood (ONETOUCH VERIO) test strip 1 each by Other route 2 (two) times a day.   hydrALAZINE (APRESOLINE) 50 MG tablet TAKE 1 TABLET BY MOUTH THREE TIMES A DAY   Lancets (ONETOUCH ULTRASOFT) lancets Use as directed to test blood sugar once daily E11.9   losartan (COZAAR) 100 MG tablet Take 1 tablet (100 mg total) by mouth daily.   metFORMIN (GLUCOPHAGE) 500 MG tablet TAKE 1 TABLET BY MOUTH DAILY WITH BREAKFAST. SCHEDULE PHYSICAL   simvastatin (ZOCOR) 40 MG tablet Take 20 mg by mouth daily. Take 1/2 tablet once a day   No facility-administered encounter medications on file as of 01/31/2021.    Allergies (verified) Codeine   History: Past Medical History:  Diagnosis Date   Anemia    CAD (coronary artery disease)    Diabetes mellitus    High cholesterol     Hypertension    Past Surgical History:  Procedure Laterality Date   CATARACT EXTRACTION, BILATERAL Bilateral 03/22/2018   CESAREAN SECTION     FOOT SURGERY Right    Bone Spur removal   TONSILLECTOMY     WISDOM TOOTH EXTRACTION     Family History  Problem Relation Age of Onset   Diabetes Mother    Hypertension Mother    Cancer Mother 25       Cervical   Hypertension Father    Diabetes Maternal Grandmother    Hypertension Sister    Diabetes Sister    Hypertension Brother    Heart disease Neg Hx    Stroke Neg Hx    Social History   Socioeconomic History   Marital status: Single    Spouse name: Not on file   Number of children: Not on file   Years of education: Not on file   Highest education level: Not on file  Occupational History   Occupation: retired from 39  Tobacco Use   Smoking status: Never   Smokeless tobacco: Never  Substance and Sexual Activity   Alcohol use: No   Drug use: No   Sexual activity: Not on file  Other Topics Concern   Not on file  Social History Narrative   Not on file   Social Determinants of Health   Financial Resource Strain: Low Risk    Difficulty of Paying Living Expenses: Not hard at all  Food Insecurity:  No Food Insecurity   Worried About Charity fundraiser in the Last Year: Never true   Ran Out of Food in the Last Year: Never true  Transportation Needs: No Transportation Needs   Lack of Transportation (Medical): No   Lack of Transportation (Non-Medical): No  Physical Activity: Sufficiently Active   Days of Exercise per Week: 5 days   Minutes of Exercise per Session: 30 min  Stress: No Stress Concern Present   Feeling of Stress : Not at all  Social Connections: Moderately Integrated   Frequency of Communication with Friends and Family: More than three times a week   Frequency of Social Gatherings with Friends and Family: More than three times a week   Attends Religious Services: More than 4 times per year   Active Member of  [employer] or Organizations: Yes   Attends Music therapist: More than 4 times per year   Marital Status: Never married    Tobacco Counseling Counseling given: Not Answered   Clinical Intake:  Pre-visit preparation completed: Yes  Pain : No/denies pain Pain Score: 0-No pain     BMI - recorded: 27.1 Nutritional Status: BMI 25 -29 Overweight Nutritional Risks: None Diabetes: Yes CBG done?: No Did pt. bring in CBG monitor from home?: No  How often do you need to have someone help you when you read instructions, pamphlets, or other written materials from your doctor or pharmacy?: 1 - Never What is the last grade level you completed in school?: Bachelor's Degree  Diabetic? yes  Interpreter Needed?: No  Information entered by :: Vicki Abu, LPN   Activities of Daily Living In your present state of health, do you have any difficulty performing the following activities: 01/31/2021 10/01/2020  Hearing? N N  Vision? N N  Difficulty concentrating or making decisions? N N  Walking or climbing stairs? N N  Dressing or bathing? N N  Doing errands, shopping? N N  Preparing Food and eating ? N -  Using the Toilet? N -  In the past six months, have you accidently leaked urine? N -  Do you have problems with loss of bowel control? N -  Managing your Medications? N -  Managing your Finances? N -  Housekeeping or managing your Housekeeping? N -  Some recent data might be hidden    Patient Care Team: Hoyt Koch, MD as PCP - General (Internal Medicine)  Indicate any recent Medical Services you may have received from other than [facility] providers in the past year (date may be approximate).     Assessment:   This is a routine wellness examination for Vicki Perry.  Hearing/Vision screen Hearing Screening - Comments:: Patient denied any hearing difficulty. No hearing aids. Vision Screening - Comments:: Patient wears readers for reading only.  Cataracts removed  from both eyes and replaced with lens implants.  Eye exams done at [facility] at [facility].  Dietary issues and exercise activities discussed: Current Exercise Habits: Home exercise routine;Structured exercise class (gym twice a week), Type of exercise: walking (walk 2-3 miles), Time (Minutes): 30, Frequency (Times/Week): 5, Weekly Exercise (Minutes/Week): 150, Intensity: Moderate, Exercise limited by: None identified   Goals Addressed               This Visit's Progress     Patient Stated (pt-stated)        My goal is to lose 10 pounds by watching my salt and sugar intake.  I will continue to be  physically active and independent.      Depression Screen PHQ 2/9 Scores 01/31/2021 10/01/2020 09/23/2019 09/03/2018 05/03/2017 03/16/2016 03/02/2015  PHQ - 2 Score 0 0 0 0 0 0 0  PHQ- 9 Score - - - 0 - - -    Fall Risk Fall Risk  01/31/2021 10/01/2020 09/03/2018 03/16/2016 03/02/2015  Falls in the past year? 0 0 0 No No  Number falls in past yr: 0 0 - - -  Injury with Fall? 0 0 - - -  Risk for fall due to : No Fall Risks - - - -  Follow up Falls evaluation completed - - - -    FALL RISK PREVENTION PERTAINING TO THE HOME:  Any stairs in or around the home? No  If so, are there any without handrails? No  Home free of loose throw rugs in walkways, pet beds, electrical cords, etc? Yes  Adequate lighting in your home to reduce risk of falls? Yes   ASSISTIVE DEVICES UTILIZED TO PREVENT FALLS:  Life alert? No  Use of a cane, walker or w/c? No  Grab bars in the bathroom? No  Shower chair or bench in shower? No  Elevated toilet seat or a handicapped toilet? Yes   TIMED UP AND GO:  Was the test performed? Yes .  Length of time to ambulate 10 feet: 7 sec.   Gait steady and fast without use of assistive device  Cognitive Function: Normal cognitive status assessed by direct observation by this Nurse Health Advisor. No abnormalities found.          Immunizations Immunization  History  Administered Date(s) Administered   Influenza,inj,Quad PF,6+ Mos 02/10/2013, 02/26/2014, 03/02/2015, 03/16/2016, 05/03/2017, 01/17/2018, 03/17/2019   PFIZER(Purple Top)SARS-COV-2 Vaccination 06/12/2019, 07/07/2019, 03/04/2020   Pneumococcal Conjugate-13 09/15/2014   Pneumococcal Polysaccharide-23 07/12/2012, 05/03/2017   Tdap 09/10/2013    TDAP status: Up to date  Flu Vaccine status: Completed at today's visit  Pneumococcal vaccine status: Up to date  Covid-19 vaccine status: Completed vaccines  Qualifies for Shingles Vaccine? Yes   Zostavax completed No   Shingrix Completed?: No.    Education has been provided regarding the importance of this vaccine. Patient has been advised to call insurance company to determine out of pocket expense if they have not yet received this vaccine. Advised may also receive vaccine at local pharmacy or Health Dept. Verbalized acceptance and understanding.  Screening Tests Health Maintenance  Topic Date Due   OPHTHALMOLOGY EXAM  05/30/2019   COVID-19 Vaccine (4 - Booster for Pfizer series) 04/29/2020   INFLUENZA VACCINE  10/18/2020   HEMOGLOBIN A1C  01/29/2021   FOOT EXAM  10/01/2021   MAMMOGRAM  04/13/2022   Fecal DNA (Cologuard)  10/08/2022   TETANUS/TDAP  09/11/2023   Pneumonia Vaccine 83+ Years old  Completed   DEXA SCAN  Completed   Hepatitis C Screening  Completed   HPV VACCINES  Aged Out   Zoster Vaccines- Shingrix  Discontinued    Health Maintenance  Health Maintenance Due  Topic Date Due   OPHTHALMOLOGY EXAM  05/30/2019   COVID-19 Vaccine (4 - Booster for Pfizer series) 04/29/2020   INFLUENZA VACCINE  10/18/2020   HEMOGLOBIN A1C  01/29/2021    Colorectal cancer screening: Type of screening: Cologuard. Completed 10/08/2019. Repeat every 3 years  Mammogram status: Completed 04/13/2020. Repeat every year  Bone Density status: Completed 03/23/2016. Results reflect: Bone density results: OSTEOPOROSIS. Repeat every 2  years.  Lung Cancer Screening: (Low Dose CT Chest recommended  if Age 72-80 years, 15 pack-year currently smoking OR have quit w/in 15years.) does not qualify.   Lung Cancer Screening Referral: no  Additional Screening:  Hepatitis C Screening: does qualify; Completed yes  Vision Screening: Recommended annual ophthalmology exams for early detection of glaucoma and other disorders of the eye. Is the patient up to date with their annual eye exam?  Yes  Who is the provider or what is the name of the office in which the patient attends annual eye exams? [facility] [place] If pt is not established with a provider, would they like to be referred to a provider to establish care? No .   Dental Screening: Recommended annual dental exams for proper oral hygiene  Community Resource Referral / Chronic Care Management: CRR required this visit?  No   CCM required this visit?  No      Plan:     I have personally reviewed and noted the following in the patient's chart:   Medical and social history Use of alcohol, tobacco or illicit drugs  Current medications and supplements including opioid prescriptions.  Functional ability and status Nutritional status Physical activity Advanced directives List of other physicians Hospitalizations, surgeries, and ER visits in previous 12 months Vitals Screenings to include cognitive, depression, and falls Referrals and appointments  In addition, I have reviewed and discussed with patient certain preventive protocols, quality metrics, and best practice recommendations. A written personalized care plan for preventive services as well as general preventive health recommendations were provided to patient.     Sheral Flow, LPN   [id number]   Nurse Notes:  Hearing Screening - Comments:: Patient denied any hearing difficulty. No hearing aids. Vision Screening - Comments:: Patient wears readers for reading only.  Cataracts removed from both  eyes and replaced with lens implants.  Eye exams done at [facility] at [facility].

## 2021-01-31 NOTE — Patient Instructions (Signed)
Ms. Bosak , Thank you for taking time to come for your Medicare Wellness Visit. I appreciate your ongoing commitment to your health goals. Please review the following plan we discussed and let me know if I can assist you in the future.   Screening recommendations/referrals: Colonoscopy: 10/08/2019; due every 3 years (Cologuard) Mammogram: 04/13/2020; due every year Bone Density: 03/23/2016; due every 2 years Recommended yearly ophthalmology/optometry visit for glaucoma screening and checkup Recommended yearly dental visit for hygiene and checkup  Vaccinations: Influenza vaccine: 01/31/2021 Pneumococcal vaccine: 09/15/2014, 05/03/2017 Tdap vaccine: 09/10/2013; due every 10 years Shingles vaccine: patient declined   Covid-19: 06/12/2019, 07/07/2019, 03/04/2020  Advanced directives: Advance directive discussed with you today. I have provided a copy for you to complete at home and have notarized. Once this is complete please bring a copy in to our office so we can scan it into your chart.  Conditions/risks identified: Yes; My goal is to lose 10 pounds by watching my diet (salt & sugar intake), staying physically active and independent.  Next appointment: Please schedule your next Medicare Wellness Visit with your Nurse Health Advisor in 1 year by calling [phone].   Preventive Care 9 Years and Older, Female Preventive care refers to lifestyle choices and visits with your health care provider that can promote health and wellness. What does preventive care include? A yearly physical exam. This is also called an annual well check. Dental exams once or twice a year. Routine eye exams. Ask your health care provider how often you should have your eyes checked. Personal lifestyle choices, including: Daily care of your teeth and gums. Regular physical activity. Eating a healthy diet. Avoiding tobacco and drug use. Limiting alcohol use. Practicing safe sex. Taking low-dose aspirin every  day. Taking vitamin and mineral supplements as recommended by your health care provider. What happens during an annual well check? The services and screenings done by your health care provider during your annual well check will depend on your age, overall health, lifestyle risk factors, and family history of disease. Counseling  Your health care provider may ask you questions about your: Alcohol use. Tobacco use. Drug use. Emotional well-being. Home and relationship well-being. Sexual activity. Eating habits. History of falls. Memory and ability to understand (cognition). Work and work Astronomer. Reproductive health. Screening  You may have the following tests or measurements: Height, weight, and BMI. Blood pressure. Lipid and cholesterol levels. These may be checked every 5 years, or more frequently if you are over 65 years old. Skin check. Lung cancer screening. You may have this screening every year starting at age 38 if you have a 30-pack-year history of smoking and currently smoke or have quit within the past 15 years. Fecal occult blood test (FOBT) of the stool. You may have this test every year starting at age 57. Flexible sigmoidoscopy or colonoscopy. You may have a sigmoidoscopy every 5 years or a colonoscopy every 10 years starting at age 77. Hepatitis C blood test. Hepatitis B blood test. Sexually transmitted disease (STD) testing. Diabetes screening. This is done by checking your blood sugar (glucose) after you have not eaten for a while (fasting). You may have this done every 1-3 years. Bone density scan. This is done to screen for osteoporosis. You may have this done starting at age 61. Mammogram. This may be done every 1-2 years. Talk to your health care provider about how often you should have regular mammograms. Talk with your health care provider about your test results, treatment options, and  if necessary, the need for more tests. Vaccines  Your health care  provider may recommend certain vaccines, such as: Influenza vaccine. This is recommended every year. Tetanus, diphtheria, and acellular pertussis (Tdap, Td) vaccine. You may need a Td booster every 10 years. Zoster vaccine. You may need this after age 51. Pneumococcal 13-valent conjugate (PCV13) vaccine. One dose is recommended after age 10. Pneumococcal polysaccharide (PPSV23) vaccine. One dose is recommended after age 44. Talk to your health care provider about which screenings and vaccines you need and how often you need them. This information is not intended to replace advice given to you by your health care provider. Make sure you discuss any questions you have with your health care provider. Document Released: 04/02/2015 Document Revised: 11/24/2015 Document Reviewed: 01/05/2015 Elsevier Interactive Patient Education  2017 [employer].  Fall Prevention in the Home Falls can cause injuries. They can happen to people of all ages. There are many things you can do to make your home safe and to help prevent falls. What can I do on the outside of my home? Regularly fix the edges of walkways and driveways and fix any cracks. Remove anything that might make you trip as you walk through a door, such as a raised step or threshold. Trim any bushes or trees on the path to your home. Use bright outdoor lighting. Clear any walking paths of anything that might make someone trip, such as rocks or tools. Regularly check to see if handrails are loose or broken. Make sure that both sides of any steps have handrails. Any raised decks and porches should have guardrails on the edges. Have any leaves, snow, or ice cleared regularly. Use sand or salt on walking paths during winter. Clean up any spills in your garage right away. This includes oil or grease spills. What can I do in the bathroom? Use night lights. Install grab bars by the toilet and in the tub and shower. Do not use towel bars as grab  bars. Use non-skid mats or decals in the tub or shower. If you need to sit down in the shower, use a plastic, non-slip stool. Keep the floor dry. Clean up any water that spills on the floor as soon as it happens. Remove soap buildup in the tub or shower regularly. Attach bath mats securely with double-sided non-slip rug tape. Do not have throw rugs and other things on the floor that can make you trip. What can I do in the bedroom? Use night lights. Make sure that you have a light by your bed that is easy to reach. Do not use any sheets or blankets that are too big for your bed. They should not hang down onto the floor. Have a firm chair that has side arms. You can use this for support while you get dressed. Do not have throw rugs and other things on the floor that can make you trip. What can I do in the kitchen? Clean up any spills right away. Avoid walking on wet floors. Keep items that you use a lot in easy-to-reach places. If you need to reach something above you, use a strong step stool that has a grab bar. Keep electrical cords out of the way. Do not use floor polish or wax that makes floors slippery. If you must use wax, use non-skid floor wax. Do not have throw rugs and other things on the floor that can make you trip. What can I do with my stairs? Do not leave any  items on the stairs. Make sure that there are handrails on both sides of the stairs and use them. Fix handrails that are broken or loose. Make sure that handrails are as long as the stairways. Check any carpeting to make sure that it is firmly attached to the stairs. Fix any carpet that is loose or worn. Avoid having throw rugs at the top or bottom of the stairs. If you do have throw rugs, attach them to the floor with carpet tape. Make sure that you have a light switch at the top of the stairs and the bottom of the stairs. If you do not have them, ask someone to add them for you. What else can I do to help prevent  falls? Wear shoes that: Do not have high heels. Have rubber bottoms. Are comfortable and fit you well. Are closed at the toe. Do not wear sandals. If you use a stepladder: Make sure that it is fully opened. Do not climb a closed stepladder. Make sure that both sides of the stepladder are locked into place. Ask someone to hold it for you, if possible. Clearly mark and make sure that you can see: Any grab bars or handrails. First and last steps. Where the edge of each step is. Use tools that help you move around (mobility aids) if they are needed. These include: Canes. Walkers. Scooters. Crutches. Turn on the lights when you go into a dark area. Replace any light bulbs as soon as they burn out. Set up your furniture so you have a clear path. Avoid moving your furniture around. If any of your floors are uneven, fix them. If there are any pets around you, be aware of where they are. Review your medicines with your doctor. Some medicines can make you feel dizzy. This can increase your chance of falling. Ask your doctor what other things that you can do to help prevent falls. This information is not intended to replace advice given to you by your health care provider. Make sure you discuss any questions you have with your health care provider. Document Released: 12/31/2008 Document Revised: 08/12/2015 Document Reviewed: 04/10/2014 Elsevier Interactive Patient Education  2017 [employer].

## 2021-02-03 ENCOUNTER — Other Ambulatory Visit: Payer: Self-pay | Admitting: Internal Medicine

## 2021-02-03 ENCOUNTER — Telehealth: Payer: Self-pay | Admitting: Internal Medicine

## 2021-02-03 DIAGNOSIS — M810 Age-related osteoporosis without current pathological fracture: Secondary | ICD-10-CM

## 2021-02-03 DIAGNOSIS — Z1231 Encounter for screening mammogram for malignant neoplasm of breast: Secondary | ICD-10-CM

## 2021-02-03 NOTE — Telephone Encounter (Signed)
Ok for me to place the order?

## 2021-02-03 NOTE — Telephone Encounter (Signed)
Patient calling in  Says she is due for her bone density test bc it has been 3 years since last one..requesting provider put order in

## 2021-02-04 NOTE — Telephone Encounter (Signed)
Yes, okay to place she is due.

## 2021-02-07 NOTE — Telephone Encounter (Signed)
Osteoporosis

## 2021-02-08 NOTE — Telephone Encounter (Signed)
Orders have been placed.

## 2021-02-27 ENCOUNTER — Other Ambulatory Visit: Payer: Self-pay | Admitting: Internal Medicine

## 2021-02-27 DIAGNOSIS — E1122 Type 2 diabetes mellitus with diabetic chronic kidney disease: Secondary | ICD-10-CM

## 2021-03-14 ENCOUNTER — Other Ambulatory Visit: Payer: Self-pay | Admitting: Family

## 2021-04-04 ENCOUNTER — Ambulatory Visit: Payer: Medicare PPO | Admitting: Internal Medicine

## 2021-04-11 ENCOUNTER — Telehealth: Payer: Self-pay | Admitting: Internal Medicine

## 2021-04-11 DIAGNOSIS — E2839 Other primary ovarian failure: Secondary | ICD-10-CM

## 2021-04-11 NOTE — Telephone Encounter (Signed)
Patient needs the orders sent over for her Bone density exam today.

## 2021-04-13 NOTE — Telephone Encounter (Signed)
Order placed

## 2021-04-13 NOTE — Telephone Encounter (Signed)
[facility] is requesting orders for bone density scheduled for tomorrow.   Callback #- [phone]

## 2021-04-14 ENCOUNTER — Other Ambulatory Visit: Payer: Self-pay

## 2021-04-14 ENCOUNTER — Ambulatory Visit (INDEPENDENT_AMBULATORY_CARE_PROVIDER_SITE_OTHER)
Admission: RE | Admit: 2021-04-14 | Discharge: 2021-04-14 | Disposition: A | Discharge status: Home / Self Care | Payer: Medicare PPO | Source: Ambulatory Visit | Attending: Internal Medicine | Admitting: Internal Medicine

## 2021-04-14 ENCOUNTER — Ambulatory Visit
Admission: RE | Admit: 2021-04-14 | Discharge: 2021-04-14 | Disposition: A | Discharge status: Home / Self Care | Payer: Medicare PPO | Source: Ambulatory Visit | Attending: Internal Medicine | Admitting: Internal Medicine

## 2021-04-14 DIAGNOSIS — Z1231 Encounter for screening mammogram for malignant neoplasm of breast: Secondary | ICD-10-CM | POA: Diagnosis not present

## 2021-04-14 DIAGNOSIS — E2839 Other primary ovarian failure: Secondary | ICD-10-CM | POA: Diagnosis not present

## 2021-05-02 ENCOUNTER — Ambulatory Visit: Payer: Medicare PPO | Admitting: Internal Medicine

## 2021-05-02 ENCOUNTER — Encounter: Payer: Self-pay | Admitting: Internal Medicine

## 2021-05-02 ENCOUNTER — Other Ambulatory Visit: Payer: Self-pay

## 2021-05-02 VITALS — BP 126/62 | HR 77 | Resp 18 | Ht 61.0 in | Wt 141.4 lb

## 2021-05-02 DIAGNOSIS — E1122 Type 2 diabetes mellitus with diabetic chronic kidney disease: Secondary | ICD-10-CM

## 2021-05-02 DIAGNOSIS — E785 Hyperlipidemia, unspecified: Secondary | ICD-10-CM | POA: Diagnosis not present

## 2021-05-02 DIAGNOSIS — E1169 Type 2 diabetes mellitus with other specified complication: Secondary | ICD-10-CM | POA: Diagnosis not present

## 2021-05-02 DIAGNOSIS — N1831 Chronic kidney disease, stage 3a: Secondary | ICD-10-CM

## 2021-05-02 LAB — CBC
HCT: 33.6 % — ABNORMAL LOW (ref 36.0–46.0)
Hemoglobin: 11.3 g/dL — ABNORMAL LOW (ref 12.0–15.0)
MCHC: 33.7 g/dL (ref 30.0–36.0)
MCV: 82.2 fl (ref 78.0–100.0)
Platelets: 288 10*3/uL (ref 150.0–400.0)
RBC: 4.09 Mil/uL (ref 3.87–5.11)
RDW: 12.5 % (ref 11.5–15.5)
WBC: 5.8 10*3/uL (ref 4.0–10.5)

## 2021-05-02 LAB — POCT GLYCOSYLATED HEMOGLOBIN (HGB A1C): Hemoglobin A1C: 6.1 % — AB (ref 4.0–5.6)

## 2021-05-02 MED ORDER — ATORVASTATIN CALCIUM 20 MG PO TABS: 20.0000 mg | ORAL_TABLET | Freq: Every day | ORAL | 3 refills | Status: DC

## 2021-05-02 NOTE — Assessment & Plan Note (Signed)
Checking CMP for stability. BP and DM under good control. Family hx dialysis.

## 2021-05-02 NOTE — Assessment & Plan Note (Signed)
Checking lipid panel today. Stop simvastatin 20 mg daily and switch to lipitor 20 mg daily due to interaction with amlodipine.

## 2021-05-02 NOTE — Patient Instructions (Addendum)
You can stop the metformin and we will recheck the levels in 3-4 months.  We will stop simvastatin and change it to atorvastatin. Still 1 pill daily.

## 2021-05-02 NOTE — Assessment & Plan Note (Signed)
POC Hga1c done 6.1. We have decided to stop metformin and follow up in 3-4 months for Hga1c check.

## 2021-05-02 NOTE — Progress Notes (Signed)
° °  Subjective:   Patient ID: Vicki Perry, female    DOB: 28-Jun-1950, 71 y.o.   MRN: [id number]  HPI The patient is a 71 YO female coming in for follow up. Some questions.   Review of Systems  Constitutional: Negative.   HENT: Negative.    Eyes: Negative.   Respiratory:  Negative for cough, chest tightness and shortness of breath.   Cardiovascular:  Negative for chest pain, palpitations and leg swelling.  Gastrointestinal:  Negative for abdominal distention, abdominal pain, constipation, diarrhea, nausea and vomiting.  Musculoskeletal: Negative.   Skin: Negative.   Neurological: Negative.   Psychiatric/Behavioral: Negative.     Objective:  Physical Exam Constitutional:      Appearance: She is well-developed.  HENT:     Head: Normocephalic and atraumatic.  Cardiovascular:     Rate and Rhythm: Normal rate and regular rhythm.  Pulmonary:     Effort: Pulmonary effort is normal. No respiratory distress.     Breath sounds: Normal breath sounds. No wheezing or rales.  Abdominal:     General: Bowel sounds are normal. There is no distension.     Palpations: Abdomen is soft.     Tenderness: There is no abdominal tenderness. There is no rebound.  Musculoskeletal:     Cervical back: Normal range of motion.  Skin:    General: Skin is warm and dry.  Neurological:     Mental Status: She is alert and oriented to person, place, and time.     Coordination: Coordination normal.    Vitals:   05/02/21 0848  BP: 126/62  Pulse: 77  Resp: 18  SpO2: 96%  Weight: 141 lb 6.4 oz (64.1 kg)  Height: 5\' 1"  (1.549 m)    This visit occurred during the SARS-CoV-2 public health emergency.  Safety protocols were in place, including screening questions prior to the visit, additional usage of staff PPE, and extensive cleaning of exam room while observing appropriate contact time as indicated for disinfecting solutions.   Assessment & Plan:

## 2021-05-03 LAB — COMPREHENSIVE METABOLIC PANEL
ALT: 9 U/L (ref 0–35)
AST: 18 U/L (ref 0–37)
Albumin: 4.4 g/dL (ref 3.5–5.2)
Alkaline Phosphatase: 64 U/L (ref 39–117)
BUN: 12 mg/dL (ref 6–23)
CO2: 30 mEq/L (ref 19–32)
Calcium: 9.7 mg/dL (ref 8.4–10.5)
Chloride: 102 mEq/L (ref 96–112)
Creatinine, Ser: 1.2 mg/dL (ref 0.40–1.20)
GFR: 45.07 mL/min — ABNORMAL LOW (ref >60.00)
Glucose, Bld: 93 mg/dL (ref 70–99)
Potassium: 4.3 mEq/L (ref 3.5–5.1)
Sodium: 135 mEq/L (ref 135–145)
Total Bilirubin: 0.4 mg/dL (ref 0.2–1.2)
Total Protein: 7 g/dL (ref 6.0–8.3)

## 2021-05-03 LAB — LIPID PANEL
Cholesterol: 111 mg/dL (ref 0–200)
HDL: 43.5 mg/dL (ref >39.00)
LDL Cholesterol: 52 mg/dL (ref 0–99)
NonHDL: 67.48
Total CHOL/HDL Ratio: 3
Triglycerides: 75 mg/dL (ref 0.0–149.0)
VLDL: 15 mg/dL (ref 0.0–40.0)

## 2021-05-04 ENCOUNTER — Telehealth: Payer: Self-pay | Admitting: Pharmacist

## 2021-05-04 NOTE — Telephone Encounter (Signed)
Patient called and left VM requesting a call back. Stated she had a question about medications. Called pt back and left VM to call back.

## 2021-05-04 NOTE — Telephone Encounter (Signed)
Patient called back. She said her PCP switched her simvastatin to atorvastatin 20mg  and she wanted to know if I thought that was a good switch. I reviewed patients labs and agree with the medication change. The concern with simvastatin was the interaction with her amlodipine. Atorvastatin 20mg  is an acceptable alternative.  Reviewed labs with her per her request.

## 2021-05-20 ENCOUNTER — Other Ambulatory Visit: Payer: Self-pay | Admitting: Internal Medicine

## 2021-05-20 DIAGNOSIS — N183 Chronic kidney disease, stage 3 unspecified: Secondary | ICD-10-CM

## 2021-05-20 DIAGNOSIS — E1122 Type 2 diabetes mellitus with diabetic chronic kidney disease: Secondary | ICD-10-CM

## 2021-05-23 ENCOUNTER — Other Ambulatory Visit: Payer: Self-pay | Admitting: Cardiology

## 2021-08-01 ENCOUNTER — Ambulatory Visit: Payer: Medicare PPO | Admitting: Internal Medicine

## 2021-08-01 ENCOUNTER — Encounter: Payer: Self-pay | Admitting: Internal Medicine

## 2021-08-01 VITALS — BP 128/66 | HR 82 | Resp 18 | Ht 61.0 in | Wt 136.0 lb

## 2021-08-01 DIAGNOSIS — I10 Essential (primary) hypertension: Secondary | ICD-10-CM

## 2021-08-01 DIAGNOSIS — E1122 Type 2 diabetes mellitus with diabetic chronic kidney disease: Secondary | ICD-10-CM

## 2021-08-01 DIAGNOSIS — N1831 Chronic kidney disease, stage 3a: Secondary | ICD-10-CM

## 2021-08-01 DIAGNOSIS — E785 Hyperlipidemia, unspecified: Secondary | ICD-10-CM

## 2021-08-01 DIAGNOSIS — E1169 Type 2 diabetes mellitus with other specified complication: Secondary | ICD-10-CM | POA: Diagnosis not present

## 2021-08-01 LAB — LIPID PANEL
Cholesterol: 159 mg/dL (ref 0–200)
HDL: 44.1 mg/dL (ref >39.00)
LDL Cholesterol: 98 mg/dL (ref 0–99)
NonHDL: 115.17
Total CHOL/HDL Ratio: 4
Triglycerides: 84 mg/dL (ref 0.0–149.0)
VLDL: 16.8 mg/dL (ref 0.0–40.0)

## 2021-08-01 LAB — COMPREHENSIVE METABOLIC PANEL
ALT: 11 U/L (ref 0–35)
AST: 15 U/L (ref 0–37)
Albumin: 4.2 g/dL (ref 3.5–5.2)
Alkaline Phosphatase: 69 U/L (ref 39–117)
BUN: 10 mg/dL (ref 6–23)
CO2: 27 mEq/L (ref 19–32)
Calcium: 9.3 mg/dL (ref 8.4–10.5)
Chloride: 102 mEq/L (ref 96–112)
Creatinine, Ser: 1.02 mg/dL (ref 0.40–1.20)
GFR: 54.69 mL/min — ABNORMAL LOW (ref >60.00)
Glucose, Bld: 101 mg/dL — ABNORMAL HIGH (ref 70–99)
Potassium: 4.4 mEq/L (ref 3.5–5.1)
Sodium: 135 mEq/L (ref 135–145)
Total Bilirubin: 0.5 mg/dL (ref 0.2–1.2)
Total Protein: 6.9 g/dL (ref 6.0–8.3)

## 2021-08-01 LAB — POCT GLYCOSYLATED HEMOGLOBIN (HGB A1C): Hemoglobin A1C: 6.2 % — AB (ref 4.0–5.6)

## 2021-08-01 MED ORDER — HYDRALAZINE HCL 50 MG PO TABS: 50.0000 mg | ORAL_TABLET | Freq: Two times a day (BID) | ORAL | 3 refills | Status: DC

## 2021-08-01 NOTE — Assessment & Plan Note (Signed)
She did stop simvastatin 20 mg daily and started atorvastatin 20 mg daily after last visit. She is having some side effects with that and is taking only when she feels like it. I have suggested change to pravastatin 20 mg daily instead but she wishes to use up current medication prior to switch. Checking lipid panel and adjust for goal LDL<100. ?

## 2021-08-01 NOTE — Assessment & Plan Note (Signed)
POC Hga1c done today at 6.2. Last time we asked her to stop metformin and she did cut back to 1 pill every other day 500 mg instead. Sugars are stable so we have asked her to stop metformin and D/C from list today. Checking CMP and lipid panel as well.  ?

## 2021-08-01 NOTE — Assessment & Plan Note (Signed)
BP at goal although she is not taking medications as prescribed. She is taking hydralazine 50 mg BID (not TID) and amlodipine 5 mg daily and losartan 100 mg daily. Since BP at goal will check CMP and keep regimen the same. New rx done for hydralazine so sig is correct.  ?

## 2021-08-01 NOTE — Patient Instructions (Signed)
When you run out of the metformin just stop taking it. ? ?It is okay to keep the hydralazine twice a day. ? ?Try taking the losartan in the morning to see if this helps with waking up at night to pee. ? ?We can change the cholesterol medicine to see if this is causing the itching. ? ? ?

## 2021-08-01 NOTE — Assessment & Plan Note (Signed)
Checking CMP for follow up. BP and sugars at goal. ?

## 2021-08-01 NOTE — Progress Notes (Signed)
? ?  Subjective:  ? ?Patient ID: Vicki Perry, female    DOB: [birth year], 71 y.o.   MRN: [id number] ? ?HPI ?The patient is a 71 YO coming in for follow up. ? ?Review of Systems  ?Constitutional: Negative.   ?HENT: Negative.    ?Eyes: Negative.   ?Respiratory:  Negative for cough, chest tightness and shortness of breath.   ?Cardiovascular:  Negative for chest pain, palpitations and leg swelling.  ?Gastrointestinal:  Negative for abdominal distention, abdominal pain, constipation, diarrhea, nausea and vomiting.  ?Musculoskeletal: Negative.   ?Skin: Negative.   ?Neurological: Negative.   ?Psychiatric/Behavioral: Negative.    ? ?Objective:  ?Physical Exam ?Constitutional:   ?   Appearance: She is well-developed.  ?HENT:  ?   Head: Normocephalic and atraumatic.  ?Cardiovascular:  ?   Rate and Rhythm: Normal rate and regular rhythm.  ?Pulmonary:  ?   Effort: Pulmonary effort is normal. No respiratory distress.  ?   Breath sounds: Normal breath sounds. No wheezing or rales.  ?Abdominal:  ?   General: Bowel sounds are normal. There is no distension.  ?   Palpations: Abdomen is soft.  ?   Tenderness: There is no abdominal tenderness. There is no rebound.  ?Musculoskeletal:  ?   Cervical back: Normal range of motion.  ?Skin: ?   General: Skin is warm and dry.  ?Neurological:  ?   Mental Status: She is alert and oriented to person, place, and time.  ?   Coordination: Coordination normal.  ? ? ?Vitals:  ? 08/01/21 0837  ?BP: 128/66  ?Pulse: 82  ?Resp: 18  ?SpO2: 99%  ?Weight: 136 lb (61.7 kg)  ?Height: 5\' 1"  (1.549 m)  ? ? ?Assessment & Plan:  ? ?

## 2021-08-02 ENCOUNTER — Telehealth: Payer: Self-pay

## 2021-08-02 ENCOUNTER — Telehealth: Payer: Self-pay | Admitting: Internal Medicine

## 2021-08-02 LAB — MICROALBUMIN / CREATININE URINE RATIO
Creatinine,U: 26.8 mg/dL
Microalb Creat Ratio: 2.6 mg/g (ref 0.0–30.0)
Microalb, Ur: <0.7 mg/dL (ref 0.0–1.9)

## 2021-08-02 NOTE — Telephone Encounter (Signed)
Noted  

## 2021-08-02 NOTE — Telephone Encounter (Signed)
See result note.  

## 2021-08-02 NOTE — Telephone Encounter (Signed)
Pt called in requesting a call back from assistant regarding last set of lab results.  ? ?Please call at earliest convenience.  ?

## 2021-08-02 NOTE — Telephone Encounter (Signed)
Pt called for results. I advised the pt of Dr. Okey Dupre result note stating Normal/stable labs. No changes needed. ? ?No further questions or concerns ? ?FYI ?

## 2021-09-28 DIAGNOSIS — J0100 Acute maxillary sinusitis, unspecified: Secondary | ICD-10-CM | POA: Diagnosis not present

## 2021-09-30 ENCOUNTER — Encounter (HOSPITAL_COMMUNITY): Payer: Self-pay

## 2021-09-30 ENCOUNTER — Emergency Department (HOSPITAL_COMMUNITY)
Admission: EM | Admit: 2021-09-30 | Discharge: 2021-09-30 | Disposition: A | Discharge status: Home / Self Care | Payer: Medicare PPO | Attending: Emergency Medicine | Admitting: Emergency Medicine

## 2021-09-30 ENCOUNTER — Emergency Department (HOSPITAL_COMMUNITY): Payer: Medicare PPO

## 2021-09-30 ENCOUNTER — Other Ambulatory Visit: Payer: Self-pay

## 2021-09-30 DIAGNOSIS — E871 Hypo-osmolality and hyponatremia: Secondary | ICD-10-CM | POA: Diagnosis not present

## 2021-09-30 DIAGNOSIS — R519 Headache, unspecified: Secondary | ICD-10-CM | POA: Insufficient documentation

## 2021-09-30 DIAGNOSIS — Z79899 Other long term (current) drug therapy: Secondary | ICD-10-CM | POA: Insufficient documentation

## 2021-09-30 DIAGNOSIS — I10 Essential (primary) hypertension: Secondary | ICD-10-CM | POA: Diagnosis not present

## 2021-09-30 DIAGNOSIS — R0602 Shortness of breath: Secondary | ICD-10-CM | POA: Diagnosis not present

## 2021-09-30 LAB — CBC WITH DIFFERENTIAL/PLATELET
Abs Immature Granulocytes: 0.03 10*3/uL (ref 0.00–0.07)
Basophils Absolute: 0.1 10*3/uL (ref 0.0–0.1)
Basophils Relative: 1 %
Eosinophils Absolute: 0.1 10*3/uL (ref 0.0–0.5)
Eosinophils Relative: 2 %
HCT: 33.6 % — ABNORMAL LOW (ref 36.0–46.0)
Hemoglobin: 11.2 g/dL — ABNORMAL LOW (ref 12.0–15.0)
Immature Granulocytes: 0 %
Lymphocytes Relative: 18 %
Lymphs Abs: 1.3 10*3/uL (ref 0.7–4.0)
MCH: 27.7 pg (ref 26.0–34.0)
MCHC: 33.3 g/dL (ref 30.0–36.0)
MCV: 83.2 fL (ref 80.0–100.0)
Monocytes Absolute: 0.4 10*3/uL (ref 0.1–1.0)
Monocytes Relative: 6 %
Neutro Abs: 5.5 10*3/uL (ref 1.7–7.7)
Neutrophils Relative %: 73 %
Platelets: 307 10*3/uL (ref 150–400)
RBC: 4.04 MIL/uL (ref 3.87–5.11)
RDW: 12.3 % (ref 11.5–15.5)
WBC: 7.5 10*3/uL (ref 4.0–10.5)
nRBC: 0 % (ref 0.0–0.2)

## 2021-09-30 LAB — URINALYSIS, ROUTINE W REFLEX MICROSCOPIC
Bilirubin Urine: NEGATIVE
Glucose, UA: NEGATIVE mg/dL
Hgb urine dipstick: NEGATIVE
Ketones, ur: NEGATIVE mg/dL
Leukocytes,Ua: NEGATIVE
Nitrite: NEGATIVE
Protein, ur: NEGATIVE mg/dL
Specific Gravity, Urine: 1.002 — ABNORMAL LOW (ref 1.005–1.030)
pH: 7 (ref 5.0–8.0)

## 2021-09-30 LAB — COMPREHENSIVE METABOLIC PANEL
ALT: 14 U/L (ref 0–44)
AST: 24 U/L (ref 15–41)
Albumin: 3.7 g/dL (ref 3.5–5.0)
Alkaline Phosphatase: 70 U/L (ref 38–126)
Anion gap: 12 (ref 5–15)
BUN: 8 mg/dL (ref 8–23)
CO2: 24 mmol/L (ref 22–32)
Calcium: 8.9 mg/dL (ref 8.9–10.3)
Chloride: 93 mmol/L — ABNORMAL LOW (ref 98–111)
Creatinine, Ser: 1.14 mg/dL — ABNORMAL HIGH (ref 0.44–1.00)
GFR, Estimated: 51 mL/min — ABNORMAL LOW (ref >60)
Glucose, Bld: 185 mg/dL — ABNORMAL HIGH (ref 70–99)
Potassium: 3.7 mmol/L (ref 3.5–5.1)
Sodium: 129 mmol/L — ABNORMAL LOW (ref 135–145)
Total Bilirubin: 0.6 mg/dL (ref 0.3–1.2)
Total Protein: 6.3 g/dL — ABNORMAL LOW (ref 6.5–8.1)

## 2021-09-30 LAB — LACTIC ACID, PLASMA: Lactic Acid, Venous: 1.1 mmol/L (ref 0.5–1.9)

## 2021-09-30 LAB — TROPONIN I (HIGH SENSITIVITY)
Troponin I (High Sensitivity): 4 ng/L (ref <18)
Troponin I (High Sensitivity): 5 ng/L (ref <18)

## 2021-09-30 MED ORDER — ACETAMINOPHEN 500 MG PO TABS
1000.0000 mg | ORAL_TABLET | Freq: Once | ORAL | Status: AC
  Administered 2021-09-30: 1000 mg via ORAL
  Filled 2021-09-30: qty 2

## 2021-09-30 NOTE — ED Provider Notes (Signed)
[facility] EMERGENCY DEPARTMENT Provider Note   CSN: [id number] Arrival date & time: 09/30/21  1323     History  Chief Complaint  Patient presents with   Hypertension    Vicki Perry is a 71 y.o. female.  Patient with hx htn, c/o concern that bp was high earlier today, in 200/ range. States compliant w her meds and has taken today. Mild frontal headache earlier. No acute, abrupt or severe head pain. States on abx therapy for possible sinusitis - denies other new meds or change in meds. No chest pain or discomfort. No sob. No increased edema. Transient tingling to palms of bil hands earlier - no neck pain or radicular pain. No weakness or loss of normal function. No change in speech or vision.   The history is provided by the patient, a relative and medical records.  Hypertension Pertinent negatives include no chest pain, no abdominal pain and no shortness of breath.       Home Medications Prior to Admission medications   Medication Sig Start Date End Date Taking? Authorizing Provider  amLODipine (NORVASC) 5 MG tablet TAKE 1 TABLET (5 MG TOTAL) BY MOUTH DAILY. 05/23/21  Yes [name], MD  atorvastatin (LIPITOR) 20 MG tablet Take 1 tablet (20 mg total) by mouth daily. 05/02/21  Yes Myrlene Broker, MD  Calcium Carb-Cholecalciferol (CALCIUM 500/D) 500-400 MG-UNIT CHEW Chew 1 tablet by mouth daily.   Yes [provider]  DENTA 5000 PLUS 1.1 % CREA dental cream Place 1 Application onto teeth at bedtime. 05/26/21  Yes [provider]  doxycycline (VIBRAMYCIN) 100 MG capsule Take 100 mg by mouth 2 (two) times daily. 09/28/21  Yes [provider]  hydrALAZINE (APRESOLINE) 50 MG tablet Take 1 tablet (50 mg total) by mouth in the morning and at bedtime. 08/01/21  Yes Myrlene Broker, MD  losartan (COZAAR) 100 MG tablet TAKE 1 TABLET BY MOUTH EVERY DAY 03/17/21  Yes Myrlene Broker, MD  glucose blood (ONETOUCH VERIO)  test strip 1 each by Other route 2 (two) times a day. 09/04/18   Lorre Munroe, NP  Lancets [facility] ULTRASOFT) lancets Use as directed to test blood sugar once daily E11.9 08/24/16   Lorre Munroe, NP      Allergies    Codeine    Review of Systems   Review of Systems  Constitutional:  Negative for fever.  HENT:  Positive for congestion.   Eyes:  Negative for pain, redness and visual disturbance.  Respiratory:  Negative for cough and shortness of breath.   Cardiovascular:  Negative for chest pain and leg swelling.  Gastrointestinal:  Negative for abdominal pain and vomiting.  Genitourinary:  Negative for flank pain.  Musculoskeletal:  Negative for neck pain and neck stiffness.  Skin:  Negative for rash.  Neurological:  Negative for speech difficulty and weakness.  Hematological:  Does not bruise/bleed easily.  Psychiatric/Behavioral:  Negative for confusion.     Physical Exam Updated Vital Signs BP (!) 154/58 (BP Location: Left Arm)   Pulse 71   Temp 98.3 F (36.8 C) (Oral)   Resp 16   Ht 1.549 m (5\' 1" )   Wt 63.5 kg   SpO2 96%   BMI 26.45 kg/m  Physical Exam Vitals and nursing note reviewed.  Constitutional:      Appearance: Normal appearance. She is well-developed.  HENT:     Head: Atraumatic.     Nose: Nose normal.  Mouth/Throat:     Mouth: Mucous membranes are moist.  Eyes:     General: No scleral icterus.    Conjunctiva/sclera: Conjunctivae normal.     Pupils: Pupils are equal, round, and reactive to light.  Neck:     Vascular: No carotid bruit.     Trachea: No tracheal deviation.     Comments: Thyroid not grossly enlarged or tender.  Cardiovascular:     Rate and Rhythm: Normal rate and regular rhythm.     Pulses: Normal pulses.     Heart sounds: Normal heart sounds. No murmur heard.    No friction rub. No gallop.  Pulmonary:     Effort: Pulmonary effort is normal. No respiratory distress.     Breath sounds: Normal breath sounds.  Abdominal:      General: There is no distension.     Tenderness: There is no abdominal tenderness.  Genitourinary:    Comments: No cva tenderness.  Musculoskeletal:        General: No swelling or tenderness.     Cervical back: Normal range of motion and neck supple. No rigidity. No muscular tenderness.     Right lower leg: No edema.     Left lower leg: No edema.     Comments: C spine non tender.   Skin:    General: Skin is warm and dry.     Findings: No rash.  Neurological:     Mental Status: She is alert.     Comments: Alert, speech normal. Motor/sens grossly intact bil. Steady gait.   Psychiatric:        Mood and Affect: Mood normal.     ED Results / Procedures / Treatments   Labs (all labs ordered are listed, but only abnormal results are displayed) Results for orders placed or performed during the hospital encounter of 09/30/21  Comprehensive metabolic panel  Result Value Ref Range   Sodium 129 (L) 135 - 145 mmol/L   Potassium 3.7 3.5 - 5.1 mmol/L   Chloride 93 (L) 98 - 111 mmol/L   CO2 24 22 - 32 mmol/L   Glucose, Bld 185 (H) 70 - 99 mg/dL   BUN 8 8 - 23 mg/dL   Creatinine, Ser 2.03 (H) 0.44 - 1.00 mg/dL   Calcium 8.9 8.9 - 55.9 mg/dL   Total Protein 6.3 (L) 6.5 - 8.1 g/dL   Albumin 3.7 3.5 - 5.0 g/dL   AST 24 15 - 41 U/L   ALT 14 0 - 44 U/L   Alkaline Phosphatase 70 38 - 126 U/L   Total Bilirubin 0.6 0.3 - 1.2 mg/dL   GFR, Estimated 51 (L) >60 mL/min   Anion gap 12 5 - 15  CBC with Differential  Result Value Ref Range   WBC 7.5 4.0 - 10.5 K/uL   RBC 4.04 3.87 - 5.11 MIL/uL   Hemoglobin 11.2 (L) 12.0 - 15.0 g/dL   HCT 74.1 (L) 63.8 - 45.3 %   MCV 83.2 80.0 - 100.0 fL   MCH 27.7 26.0 - 34.0 pg   MCHC 33.3 30.0 - 36.0 g/dL   RDW 64.6 80.3 - 21.2 %   Platelets 307 150 - 400 K/uL   nRBC 0.0 0.0 - 0.2 %   Neutrophils Relative % 73 %   Neutro Abs 5.5 1.7 - 7.7 K/uL   Lymphocytes Relative 18 %   Lymphs Abs 1.3 0.7 - 4.0 K/uL   Monocytes Relative 6 %   Monocytes Absolute 0.4  0.1 -  1.0 K/uL   Eosinophils Relative 2 %   Eosinophils Absolute 0.1 0.0 - 0.5 K/uL   Basophils Relative 1 %   Basophils Absolute 0.1 0.0 - 0.1 K/uL   Immature Granulocytes 0 %   Abs Immature Granulocytes 0.03 0.00 - 0.07 K/uL  Urinalysis, Routine w reflex microscopic  Result Value Ref Range   Color, Urine STRAW (A) YELLOW   APPearance CLEAR CLEAR   Specific Gravity, Urine 1.002 (L) 1.005 - 1.030   pH 7.0 5.0 - 8.0   Glucose, UA NEGATIVE NEGATIVE mg/dL   Hgb urine dipstick NEGATIVE NEGATIVE   Bilirubin Urine NEGATIVE NEGATIVE   Ketones, ur NEGATIVE NEGATIVE mg/dL   Protein, ur NEGATIVE NEGATIVE mg/dL   Nitrite NEGATIVE NEGATIVE   Leukocytes,Ua NEGATIVE NEGATIVE  Lactic acid, plasma  Result Value Ref Range   Lactic Acid, Venous 1.1 0.5 - 1.9 mmol/L  Troponin I (High Sensitivity)  Result Value Ref Range   Troponin I (High Sensitivity) 4 <18 ng/L  Troponin I (High Sensitivity)  Result Value Ref Range   Troponin I (High Sensitivity) 5 <18 ng/L   DG Chest 2 View  Result Date: 09/30/2021 CLINICAL DATA:  Shortness of breath. Hypertension. High blood pressure. EXAM: CHEST - 2 VIEW COMPARISON:  May 12 1,022 FINDINGS: The heart size and mediastinal contours are within normal limits. Both lungs are clear. The visualized skeletal structures are unremarkable. IMPRESSION: No active cardiopulmonary disease. Electronically Signed   By: Gerome Sam III M.D.   On: 09/30/2021 14:40   CT HEAD WO CONTRAST ( )  Result Date: 09/30/2021 CLINICAL DATA:  New or worsening headache. EXAM: CT HEAD WITHOUT CONTRAST TECHNIQUE: Contiguous axial images were obtained from the base of the skull through the vertex without intravenous contrast. RADIATION DOSE REDUCTION: This exam was performed according to the departmental dose-optimization program which includes automated exposure control, adjustment of the mA and/or kV according to patient size and/or use of iterative reconstruction technique. COMPARISON:  CT  brain 09/19/2019 FINDINGS: Brain: There is mild cortical atrophy, unchanged from prior and within normal limits for patient age. The ventricles are normal in configuration. The basilar cisterns are patent. No mass, mass effect, or midline shift. No acute intracranial hemorrhage is seen. No abnormal extra-axial fluid collection. Preservation of the normal cortical gray-white interface without CT evidence of an acute major vascular territorial cortical based infarction. Vascular: No hyperdense vessel or unexpected calcification. Skull: Normal. Negative for fracture or focal lesion. Sinuses/Orbits: Status post bilateral ocular lens replacements. The visualized paranasal sinuses and mastoid air cells are clear. Other: None. IMPRESSION: No acute intracranial process. Electronically Signed   By: Neita Garnet M.D.   On: 09/30/2021 14:30      EKG EKG Interpretation  Date/Time:  Friday September 30 2021 13:27:17 EDT Ventricular Rate:  94 PR Interval:  128 QRS Duration: 98 QT Interval:  336 QTC Calculation: 420 R Axis:   73 Text Interpretation: Normal sinus rhythm Non-specific ST-t changes No significant change since last tracing Confirmed by Cathren Laine ([id number]) on 09/30/2021 5:43:21 PM  Radiology DG Chest 2 View  Result Date: 09/30/2021 CLINICAL DATA:  Shortness of breath. Hypertension. High blood pressure. EXAM: CHEST - 2 VIEW COMPARISON:  May 12 1,022 FINDINGS: The heart size and mediastinal contours are within normal limits. Both lungs are clear. The visualized skeletal structures are unremarkable. IMPRESSION: No active cardiopulmonary disease. Electronically Signed   By: Gerome Sam III M.D.   On: 09/30/2021 14:40   CT HEAD WO CONTRAST ( )  Result Date: 09/30/2021 CLINICAL DATA:  New or worsening headache. EXAM: CT HEAD WITHOUT CONTRAST TECHNIQUE: Contiguous axial images were obtained from the base of the skull through the vertex without intravenous contrast. RADIATION DOSE REDUCTION: This exam  was performed according to the departmental dose-optimization program which includes automated exposure control, adjustment of the mA and/or kV according to patient size and/or use of iterative reconstruction technique. COMPARISON:  CT brain 09/19/2019 FINDINGS: Brain: There is mild cortical atrophy, unchanged from prior and within normal limits for patient age. The ventricles are normal in configuration. The basilar cisterns are patent. No mass, mass effect, or midline shift. No acute intracranial hemorrhage is seen. No abnormal extra-axial fluid collection. Preservation of the normal cortical gray-white interface without CT evidence of an acute major vascular territorial cortical based infarction. Vascular: No hyperdense vessel or unexpected calcification. Skull: Normal. Negative for fracture or focal lesion. Sinuses/Orbits: Status post bilateral ocular lens replacements. The visualized paranasal sinuses and mastoid air cells are clear. Other: None. IMPRESSION: No acute intracranial process. Electronically Signed   By: Neita Garnet M.D.   On: 09/30/2021 14:30    Procedures Procedures    Medications Ordered in ED Medications  acetaminophen (TYLENOL) tablet 1,000 mg (has no administration in time range)    ED Course/ Medical Decision Making/ A&P                           Medical Decision Making Problems Addressed: Essential hypertension: chronic illness or injury with exacerbation, progression, or side effects of treatment that poses a threat to life or bodily functions Hyponatremia: acute illness or injury  Amount and/or Complexity of Data Reviewed Independent Historian:     Details: family, hx External Data Reviewed: notes. Labs: ordered. Decision-making details documented in ED Course. Radiology: ordered and independent interpretation performed. Decision-making details documented in ED Course. ECG/medicine tests: ordered and independent interpretation performed. Decision-making details  documented in ED Course.  Risk OTC drugs. Decision regarding hospitalization.   Iv ns. Continuous pulse ox and cardiac monitoring. Labs ordered/sent. Imaging ordered.   Diff dx incudes htn, uncontrolled htn, aki, etc - dispo decision including potential need for admission considered - will get labs and imaging and reassess.   Reviewed nursing notes and prior charts for additional history. External reports reviewed. Additional history from: family.  Cardiac monitor: sinus rhythm, rate 70.  Labs reviewed/interpreted by me - na sl low. Trop normal.   Xrays reviewed/interpreted by me - no pna.   CT reviewed/interpreted by me - no hem.   Acetaminophen po.  Recheck, bp improved from initial, 154/58, and does not require emergent lowering.   Patient appears stable for d/c.   Rec pcp f/u.   Return precautions provided.            Final Clinical Impression(s) / ED Diagnoses Final diagnoses:  Essential hypertension  Hyponatremia    Rx / DC Orders ED Discharge Orders     None         Cathren Laine, MD 09/30/21 [phone]

## 2021-09-30 NOTE — ED Triage Notes (Signed)
Pt states she had high blood pressure today (200/__), shortness of breath, hands tingling and headache.

## 2021-09-30 NOTE — Discharge Instructions (Addendum)
It was our pleasure to provide your ER care today - we hope that you feel better.  Continue your blood pressure medication.  From today's labs, your sodium level is mildly low (131) -  follow up with your doctor for this as well as for recheck of your blood pressure.   Return to ER if worse, new symptoms, chest pain, trouble breathing, or other concern.

## 2021-09-30 NOTE — ED Provider Triage Note (Addendum)
Emergency Medicine Provider Triage Evaluation Note  Vicki Perry , a 71 y.o. female  was evaluated in triage.  Pt complains of high blood pressure.  A few days ago she went to her doctors office.  Her pressure was 201 per her report.    She was seen two days ago for a sinus headache and hypertension at urgent care.   She took her doxycyline today for the first time.  She waited until noon to take her blood pressure medication.   She had labs drawn on the 12th showing her sodium was 131  She reports that at 1pm she didn't feel well and checked her blood pressure and it was in the 200s.   She reports that her hands became more numb than usual, she could feel her heart beating fast, and she developed a headache.   A few months ago she started taking her hydralazine BID instead of TID.  She reports compliance with that and her amlodipine. She did take her hydralazine later than usual today.   At the time of my evaluation her HR had improved to 85, her head feels better but still hurts.  Her BP had improved to 156/50.    Physical Exam  BP (!) 194/51 (BP Location: Right Arm)   Pulse (!) 107   Temp 98.3 F (36.8 C) (Oral)   Resp 18   Ht 5\' 1"  (1.549 m)   Wt 63.5 kg   SpO2 100%   BMI 26.45 kg/m  Gen:   Awake, no distress   Resp:  Normal effort  MSK:   Moves extremities without difficulty  Other:  Normal speech.   Medical Decision Making  Medically screening exam initiated at 1:57 PM.  Appropriate orders placed.  Karmen A Zidek was informed that the remainder of the evaluation will be completed by another provider, this initial triage assessment does not replace that evaluation, and the importance of remaining in the ED until their evaluation is complete.     , PA-C 09/30/21 1405    10/02/21, PA-C 09/30/21 1409

## 2021-09-30 NOTE — ED Notes (Signed)
Pt verbalized understanding of d/c instructions, meds, and followup care. Denies questions. VSS, no distress noted. Steady gait to exit with all belongings.  ?

## 2021-10-03 ENCOUNTER — Encounter: Payer: Self-pay | Admitting: Internal Medicine

## 2021-10-03 ENCOUNTER — Ambulatory Visit: Payer: Medicare PPO | Admitting: Internal Medicine

## 2021-10-03 VITALS — BP 130/66 | HR 66 | Resp 18 | Ht 61.0 in | Wt 136.8 lb

## 2021-10-03 DIAGNOSIS — E1169 Type 2 diabetes mellitus with other specified complication: Secondary | ICD-10-CM

## 2021-10-03 DIAGNOSIS — N1831 Chronic kidney disease, stage 3a: Secondary | ICD-10-CM

## 2021-10-03 DIAGNOSIS — E1122 Type 2 diabetes mellitus with diabetic chronic kidney disease: Secondary | ICD-10-CM | POA: Diagnosis not present

## 2021-10-03 DIAGNOSIS — E871 Hypo-osmolality and hyponatremia: Secondary | ICD-10-CM

## 2021-10-03 DIAGNOSIS — E785 Hyperlipidemia, unspecified: Secondary | ICD-10-CM | POA: Diagnosis not present

## 2021-10-03 DIAGNOSIS — N183 Chronic kidney disease, stage 3 unspecified: Secondary | ICD-10-CM | POA: Diagnosis not present

## 2021-10-03 LAB — COMPREHENSIVE METABOLIC PANEL
ALT: 11 U/L (ref 0–35)
AST: 19 U/L (ref 0–37)
Albumin: 4.5 g/dL (ref 3.5–5.2)
Alkaline Phosphatase: 72 U/L (ref 39–117)
BUN: 11 mg/dL (ref 6–23)
CO2: 24 mEq/L (ref 19–32)
Calcium: 9.3 mg/dL (ref 8.4–10.5)
Chloride: 92 mEq/L — ABNORMAL LOW (ref 96–112)
Creatinine, Ser: 1 mg/dL (ref 0.40–1.20)
GFR: 55.93 mL/min — ABNORMAL LOW (ref >60.00)
Glucose, Bld: 103 mg/dL — ABNORMAL HIGH (ref 70–99)
Potassium: 3.9 mEq/L (ref 3.5–5.1)
Sodium: 125 mEq/L — ABNORMAL LOW (ref 135–145)
Total Bilirubin: 0.6 mg/dL (ref 0.2–1.2)
Total Protein: 7 g/dL (ref 6.0–8.3)

## 2021-10-03 LAB — VITAMIN B12: Vitamin B-12: 370 pg/mL (ref 211–911)

## 2021-10-03 LAB — TSH: TSH: 1.5 u[IU]/mL (ref 0.35–5.50)

## 2021-10-03 LAB — MAGNESIUM: Magnesium: 1.8 mg/dL (ref 1.5–2.5)

## 2021-10-03 NOTE — Patient Instructions (Signed)
We will check the labs today. 

## 2021-10-03 NOTE — Progress Notes (Signed)
   Subjective:   Patient ID: Vicki Perry, female    DOB: Feb 06, 1951, 71 y.o.   MRN: [id number]  HPI The patient is a 71 YO female coming in for ER follow up for labile BP recently and with recurrent low sodium level on labs. She is feeling okay. She is worried that atorvastatin could be related to this.  PMH, [facility], social history reviewed and updated  Review of Systems  Constitutional:  Positive for fatigue.  HENT: Negative.    Eyes: Negative.   Respiratory:  Negative for cough, chest tightness and shortness of breath.   Cardiovascular:  Negative for chest pain, palpitations and leg swelling.  Gastrointestinal:  Negative for abdominal distention, abdominal pain, constipation, diarrhea, nausea and vomiting.  Musculoskeletal: Negative.   Skin: Negative.   Neurological: Negative.   Psychiatric/Behavioral: Negative.      Objective:  Physical Exam Constitutional:      Appearance: She is well-developed.  HENT:     Head: Normocephalic and atraumatic.  Cardiovascular:     Rate and Rhythm: Normal rate and regular rhythm.  Pulmonary:     Effort: Pulmonary effort is normal. No respiratory distress.     Breath sounds: Normal breath sounds. No wheezing or rales.  Abdominal:     General: Bowel sounds are normal. There is no distension.     Palpations: Abdomen is soft.     Tenderness: There is no abdominal tenderness. There is no rebound.  Musculoskeletal:     Cervical back: Normal range of motion.  Skin:    General: Skin is warm and dry.  Neurological:     Mental Status: She is alert and oriented to person, place, and time.     Coordination: Coordination normal.     Vitals:   10/03/21 1047  BP: 130/66  Pulse: 66  Resp: 18  SpO2: 98%  Weight: 136 lb 12.8 oz (62.1 kg)  Height: 5\' 1"  (1.549 m)    Assessment & Plan:  Visit time 25 minutes in face to face communication with patient and coordination of care, additional 15 minutes spent in record review, coordination or care,  ordering tests, communicating/referring to other healthcare professionals, documenting in medical records all on the same day of the visit for total time 40 minutes spent on the visit.

## 2021-10-05 ENCOUNTER — Encounter: Payer: Self-pay | Admitting: Internal Medicine

## 2021-10-05 DIAGNOSIS — E871 Hypo-osmolality and hyponatremia: Secondary | ICD-10-CM | POA: Insufficient documentation

## 2021-10-05 NOTE — Assessment & Plan Note (Signed)
She has stopped taking atorvastatin 20 mg daily due to concerns that this is impacting sodium. Reassurance given that this is not likely and advised to resume.

## 2021-10-05 NOTE — Assessment & Plan Note (Signed)
Checking CMP today for stability.

## 2021-10-05 NOTE — Assessment & Plan Note (Signed)
She did stop metformin in May and not due for recheck. Sugars stable on recent labs in the ER. Prior HgA1c over controlled. She is on ARB and prescribed statin but she is not taking it lately due to concerns.

## 2021-10-05 NOTE — Assessment & Plan Note (Signed)
This is a new problem to me. She had this back in the fall with cardiology and it is not clear that this was investigated then. They had changed her BP medications which may have been related. She is not currently on any thiazides which are known to cause hyponatremia. She is normal volume today. She does not appear to have heart failure (prior diastolic dysfunction on echo from 2-3 years ago). Needs TSH and magnesium today as well as CMP. Depending on results she may need further evaluation with urine sodium studies. She is not symptomatic today. If all labs normal and still with hyponatremia would be reasonable to do AM cortisol reading.

## 2021-10-10 ENCOUNTER — Other Ambulatory Visit: Payer: Self-pay | Admitting: Internal Medicine

## 2021-10-10 DIAGNOSIS — E871 Hypo-osmolality and hyponatremia: Secondary | ICD-10-CM

## 2021-10-18 ENCOUNTER — Other Ambulatory Visit (INDEPENDENT_AMBULATORY_CARE_PROVIDER_SITE_OTHER): Payer: Medicare PPO

## 2021-10-18 DIAGNOSIS — E871 Hypo-osmolality and hyponatremia: Secondary | ICD-10-CM

## 2021-10-18 LAB — BASIC METABOLIC PANEL
BUN: 9 mg/dL (ref 6–23)
CO2: 26 mEq/L (ref 19–32)
Calcium: 9.7 mg/dL (ref 8.4–10.5)
Chloride: 98 mEq/L (ref 96–112)
Creatinine, Ser: 0.98 mg/dL (ref 0.40–1.20)
GFR: 57.29 mL/min — ABNORMAL LOW (ref >60.00)
Glucose, Bld: 109 mg/dL — ABNORMAL HIGH (ref 70–99)
Potassium: 4.2 mEq/L (ref 3.5–5.1)
Sodium: 130 mEq/L — ABNORMAL LOW (ref 135–145)

## 2021-10-20 LAB — SODIUM, URINE, RANDOM: Sodium, Ur: 40 mmol/L (ref 28–272)

## 2021-10-20 LAB — OSMOLALITY, URINE: Osmolality, Ur: 129 mOsm/kg (ref 50–1200)

## 2021-11-30 DIAGNOSIS — R509 Fever, unspecified: Secondary | ICD-10-CM | POA: Diagnosis not present

## 2021-11-30 DIAGNOSIS — U071 COVID-19: Secondary | ICD-10-CM | POA: Diagnosis not present

## 2021-12-09 DIAGNOSIS — J0100 Acute maxillary sinusitis, unspecified: Secondary | ICD-10-CM | POA: Diagnosis not present

## 2022-01-17 ENCOUNTER — Other Ambulatory Visit: Payer: Self-pay | Admitting: Cardiology

## 2022-01-26 ENCOUNTER — Telehealth: Payer: Self-pay | Admitting: Internal Medicine

## 2022-01-26 NOTE — Telephone Encounter (Signed)
Left message for patient to call back to schedule Medicare Annual Wellness Visit   Last AWV  01/31/21  Please schedule at anytime with [facility]-N[facility] Health Advisor if patient calls the office back.     Any questions, please call me at [phone] 

## 2022-02-03 ENCOUNTER — Ambulatory Visit (INDEPENDENT_AMBULATORY_CARE_PROVIDER_SITE_OTHER): Payer: Medicare PPO | Admitting: *Deleted

## 2022-02-03 ENCOUNTER — Encounter: Payer: Self-pay | Admitting: *Deleted

## 2022-02-03 VITALS — BP 136/68 | HR 68 | Temp 97.9°F | Ht 61.0 in | Wt 137.0 lb

## 2022-02-03 DIAGNOSIS — Z23 Encounter for immunization: Secondary | ICD-10-CM | POA: Diagnosis not present

## 2022-02-03 DIAGNOSIS — Z0000 Encounter for general adult medical examination without abnormal findings: Secondary | ICD-10-CM | POA: Diagnosis not present

## 2022-02-03 NOTE — Progress Notes (Signed)
Subjective:   Vicki Perry is a 71 y.o. female who presents for Medicare Annual (Subsequent) preventive examination.  Review of Systems    Defer to PCP Cardiac Risk Factors include: advanced age (>4255men, 76>65 women);diabetes mellitus;hypertension    Please see problem list for additional risk factors  Objective:    Today's Vitals   02/03/22 1133  BP: 136/68  Pulse: 68  Temp: 97.9 F (36.6 C)  TempSrc: Oral  SpO2: 98%  Weight: 137 lb (62.1 kg)  Height: 5\' 1"  (1.549 m)   Body mass index is 25.89 kg/m.     02/03/2022   11:43 AM 09/30/2021    1:38 PM 01/31/2021   10:19 AM 09/19/2019   12:26 PM  Advanced Directives  Does Patient Have a Medical Advance Directive? No No No No  Would patient like information on creating a medical advance directive? No - Patient declined  Yes (MAU/Ambulatory/Procedural Areas - Information given) Yes (ED - Information included in AVS)    Current Medications (verified) Outpatient Encounter Medications as of 02/03/2022  Medication Sig   amLODipine (NORVASC) 5 MG tablet TAKE 1 TABLET (5 MG TOTAL) BY MOUTH DAILY.   Calcium Carb-Cholecalciferol (CALCIUM 500/D) 500-400 MG-UNIT CHEW Chew 1 tablet by mouth daily.   DENTA 5000 PLUS 1.1 % CREA dental cream Place 1 Application onto teeth at bedtime.   glucose blood (ONETOUCH VERIO) test strip 1 each by Other route 2 (two) times a day.   hydrALAZINE (APRESOLINE) 50 MG tablet TAKE 1 TABLET BY MOUTH THREE TIMES A DAY   Lancets (ONETOUCH ULTRASOFT) lancets Use as directed to test blood sugar once daily E11.9   losartan (COZAAR) 100 MG tablet TAKE 1 TABLET BY MOUTH EVERY DAY   atorvastatin (LIPITOR) 20 MG tablet Take 1 tablet (20 mg total) by mouth daily. (Patient not taking: Reported on 10/03/2021)   [DISCONTINUED] doxycycline (VIBRAMYCIN) 100 MG capsule Take 100 mg by mouth 2 (two) times daily.   No facility-administered encounter medications on file as of 02/03/2022.    Allergies (verified) Codeine    History: Past Medical History:  Diagnosis Date   Anemia    CAD (coronary artery disease)    Diabetes mellitus    High cholesterol    Hypertension    Past Surgical History:  Procedure Laterality Date   CATARACT EXTRACTION, BILATERAL Bilateral 03/22/2018   CESAREAN SECTION     FOOT SURGERY Right    Bone Spur removal   TONSILLECTOMY     WISDOM TOOTH EXTRACTION     Family History  Problem Relation Age of Onset   Diabetes Mother    Hypertension Mother    Cancer Mother [id number]       Cervical   Hypertension Father    Diabetes Maternal Grandmother    Hypertension Sister    Diabetes Sister    Hypertension Brother    Heart disease Neg Hx    Stroke Neg Hx    Social History   Socioeconomic History   Marital status: Single    Spouse name: Not on file   Number of children: Not on file   Years of education: Not on file   Highest education level: Not on file  Occupational History   Occupation: retired from [employer]  Tobacco Use   Smoking status: Never   Smokeless tobacco: Never  Vaping Use   Vaping Use: Never used  Substance and Sexual Activity   Alcohol use: No   Drug use: No   Sexual activity: Not on  file  Other Topics Concern   Not on file  Social History Narrative   Not on file   Social Determinants of Health   Financial Resource Strain: Low Risk  (02/03/2022)   Overall Financial Resource Strain (CARDIA)    Difficulty of Paying Living Expenses: Not hard at all  Food Insecurity: No Food Insecurity (02/03/2022)   Hunger Vital Sign    Worried About Running Out of Food in the Last Year: Never true    Ran Out of Food in the Last Year: Never true  Transportation Needs: No Transportation Needs (02/03/2022)   PRAPARE - Administrator, Civil Service (Medical): No    Lack of Transportation (Non-Medical): No  Physical Activity: Insufficiently Active (02/03/2022)   Exercise Vital Sign    Days of Exercise per Week: 2 days    Minutes of Exercise per Session: 60 min   Stress: No Stress Concern Present (02/03/2022)   [employer] of Occupational Health - Occupational Stress Questionnaire    Feeling of Stress : Not at all  Social Connections: Moderately Integrated (02/03/2022)   Social Connection and Isolation Panel [NHANES]    Frequency of Communication with Friends and Family: More than three times a week    Frequency of Social Gatherings with Friends and Family: More than three times a week    Attends Religious Services: More than 4 times per year    Active Member of [employer] or Organizations: Yes    Attends Banker Meetings: 1 to 4 times per year    Marital Status: Never married    Tobacco Counseling Counseling given: Not Answered   Clinical Intake:  Pre-visit preparation completed: Yes  Pain : No/denies pain     Nutritional Status: BMI 25 -29 Overweight Nutritional Risks: None Diabetes: Yes CBG done?: No Did pt. bring in CBG monitor from home?: No  How often do you need to have someone help you when you read instructions, pamphlets, or other written materials from your doctor or pharmacy?: 1 - Never What is the last grade level you completed in school?: 4 year college  Diabetic?Nutrition Risk Assessment:  Has the patient had any N/V/D within the last 2 months?  No  Does the patient have any non-healing wounds?  No  Has the patient had any unintentional weight loss or weight gain?  No   Diabetes:  Is the patient diabetic?  Yes  If diabetic, was a CBG obtained today?  No  Did the patient bring in their glucometer from home?  No  How often do you monitor your CBG's? 1-2 times .   Financial Strains and Diabetes Management:  Are you having any financial strains with the device, your supplies or your medication? No .  Does the patient want to be seen by Chronic Care Management for management of their diabetes?  No  Would the patient like to be referred to a Nutritionist or for Diabetic Management?  No   Diabetic  Exams:  Diabetic Eye Exam: Overdue for diabetic eye exam. Pt has been advised about the importance in completing this exam. Patient advised to call and schedule an eye exam. Diabetic Foot Exam: Overdue, Pt has been advised about the importance in completing this exam. Pt is scheduled for diabetic foot exam on n/a.   Interpreter Needed?: No      Activities of Daily Living    02/03/2022   11:44 AM 02/03/2022   11:42 AM  In your present state of health, do  you have any difficulty performing the following activities:  Hearing?  0  Vision?  1  Comment  patient has reading glasses  Difficulty concentrating or making decisions?  0  Walking or climbing stairs?  0  Dressing or bathing?  0  Doing errands, shopping?  0  Preparing Food and eating ? N   Using the Toilet? N   In the past six months, have you accidently leaked urine? N   Do you have problems with loss of bowel control? N   Managing your Medications? N   Managing your Finances? N   Housekeeping or managing your Housekeeping? N     Patient Care Team: Myrlene Broker, MD as PCP - General (Internal Medicine)  Indicate any recent Medical Services you may have received from other than [facility] providers in the past year (date may be approximate).     Assessment:   This is a routine wellness examination for Vicki Perry.  Hearing/Vision screen Hearing Screening - Comments:: No hearing problems Vision Screening - Comments:: Annual eye exam   Dietary issues and exercise activities discussed: Current Exercise Habits: Structured exercise class, Type of exercise: walking;treadmill, Time (Minutes): 40, Frequency (Times/Week): 2, Weekly Exercise (Minutes/Week): 80, Intensity: Moderate, Exercise limited by: None identified   Goals Addressed   None   Depression Screen    02/03/2022   11:41 AM 02/03/2022   11:38 AM 10/03/2021   10:54 AM 08/01/2021    8:42 AM 01/31/2021   11:07 AM 10/01/2020    8:25 AM 09/23/2019    2:36 PM  PHQ  2/9 Scores  PHQ - 2 Score 0 0 0 0 0 0 0  PHQ- 9 Score   0 0       Fall Risk    02/03/2022   11:38 AM 10/03/2021   10:54 AM 08/01/2021    8:42 AM 01/31/2021   11:10 AM 10/01/2020    8:25 AM  Fall Risk   Falls in the past year? 0 0 0 0 0  Number falls in past yr: 0 0 0 0 0  Injury with Fall? 0 0 0 0 0  Risk for fall due to : No Fall Risks   No Fall Risks   Follow up Falls evaluation completed   Falls evaluation completed     FALL RISK PREVENTION PERTAINING TO THE HOME:  Any stairs in or around the home? Yes  If so, are there any without handrails? Yes  Home free of loose throw rugs in walkways, pet beds, electrical cords, etc? Yes  Adequate lighting in your home to reduce risk of falls? Yes   ASSISTIVE DEVICES UTILIZED TO PREVENT FALLS:  Life alert? No  Use of a cane, walker or w/c? No  Grab bars in the bathroom? No  Shower chair or bench in shower? No  Elevated toilet seat or a handicapped toilet? Yes   TIMED UP AND GO:  Was the test performed? Yes .  Length of time to ambulate 10 feet: 6 sec.   Gait steady and fast without use of assistive device  Cognitive Function:        02/03/2022   11:42 AM  6CIT Screen  What Year? 0 points  What month? 0 points  What time? 0 points  Count back from 20 0 points  Months in reverse 0 points  Repeat phrase 0 points  Total Score 0 points    Immunizations Immunization History  Administered Date(s) Administered   Fluad Quad(high Dose 65+) 01/31/2021  Influenza,inj,Quad PF,6+ Mos 02/10/2013, 02/26/2014, 03/02/2015, 03/16/2016, 05/03/2017, 01/17/2018, 03/17/2019   PFIZER(Purple Top)SARS-COV-2 Vaccination 06/12/2019, 07/07/2019, 03/04/2020   Pneumococcal Conjugate-13 09/15/2014   Pneumococcal Polysaccharide-23 07/12/2012, 05/03/2017   Tdap 09/10/2013    TDAP status: Due, Education has been provided regarding the importance of this vaccine. Advised may receive this vaccine at local pharmacy or Health Dept. Aware to  provide a copy of the vaccination record if obtained from local pharmacy or Health Dept. Verbalized acceptance and understanding.  Flu Vaccine status: Completed at today's visit  Pneumococcal vaccine status: Up to date  Covid-19 vaccine status: Declined, Education has been provided regarding the importance of this vaccine but patient still declined. Advised may receive this vaccine at local pharmacy or Health Dept.or vaccine clinic. Aware to provide a copy of the vaccination record if obtained from local pharmacy or Health Dept. Verbalized acceptance and understanding.  Qualifies for Shingles Vaccine? Yes   Zostavax completed No   Shingrix Completed?: No.    Education has been provided regarding the importance of this vaccine. Patient has been advised to call insurance company to determine out of pocket expense if they have not yet received this vaccine. Advised may also receive vaccine at local pharmacy or Health Dept. Verbalized acceptance and understanding.  Screening Tests Health Maintenance  Topic Date Due   OPHTHALMOLOGY EXAM  05/30/2019   COVID-19 Vaccine (4 - Pfizer risk series) 04/29/2020   FOOT EXAM  10/01/2021   INFLUENZA VACCINE  10/18/2021   HEMOGLOBIN A1C  02/01/2022   Diabetic kidney evaluation - Urine ACR  08/02/2022   Fecal DNA (Cologuard)  10/08/2022   Diabetic kidney evaluation - GFR measurement  10/19/2022   Medicare Annual Wellness (AWV)  02/04/2023   MAMMOGRAM  04/15/2023   Pneumonia Vaccine 74+ Years old  Completed   DEXA SCAN  Completed   Hepatitis C Screening  Completed   HPV VACCINES  Aged Out   COLONOSCOPY (Pts 45-43yrs Insurance coverage will need to be confirmed)  Discontinued   Zoster Vaccines- Shingrix  Discontinued    Health Maintenance  Health Maintenance Due  Topic Date Due   OPHTHALMOLOGY EXAM  05/30/2019   COVID-19 Vaccine (4 - Pfizer risk series) 04/29/2020   FOOT EXAM  10/01/2021   INFLUENZA VACCINE  10/18/2021   HEMOGLOBIN A1C   02/01/2022    Colorectal cancer screening: No longer required.   Mammogram status: Completed 04/14/2021. Repeat every year  Bone Density status: Completed 04/14/2021. Results reflect: Bone density results: OSTEOPOROSIS. Repeat every 2 years.  Lung Cancer Screening: (Low Dose CT Chest recommended if Age 79-80 years, 30 pack-year currently smoking OR have quit w/in 15years.) does not qualify.   Lung Cancer Screening Referral: n/a  Additional Screening:  Hepatitis C Screening: does qualify; Completed 03/02/2015  Vision Screening: Recommended annual ophthalmology exams for early detection of glaucoma and other disorders of the eye. Is the patient up to date with their annual eye exam?  Yes  Who is the provider or what is the name of the office in which the patient attends annual eye exams? [facility] on [address]  If pt is not established with a provider, would they like to be referred to a provider to establish care? No .   Dental Screening: Recommended annual dental exams for proper oral hygiene  Community Resource Referral / Chronic Care Management: CRR required this visit?  No   CCM required this visit?  No      Plan:     I have personally  reviewed and noted the following in the patient's chart:   Medical and social history Use of alcohol, tobacco or illicit drugs  Current medications and supplements including opioid prescriptions. Patient is not currently taking opioid prescriptions. Functional ability and status Nutritional status Physical activity Advanced directives List of other physicians Hospitalizations, surgeries, and ER visits in previous 12 months Vitals Screenings to include cognitive, depression, and falls Referrals and appointments  In addition, I have reviewed and discussed with patient certain preventive protocols, quality metrics, and best practice recommendations. A written personalized care plan for preventive services as well as general  preventive health recommendations were provided to patient.     Tamela Oddi, CMA   02/03/2022  Non face to face 40  minutes  Nurse Notes:  Vicki Perry , Thank you for taking time to come for your Medicare Wellness Visit. I appreciate your ongoing commitment to your health goals. Please review the following plan we discussed and let me know if I can assist you in the future.   These are the goals we discussed:  Goals       Patient Stated (pt-stated)      My goal is to lose 10 pounds by watching my salt and sugar intake.  I will continue to be physically active and independent.        This is a list of the screening recommended for you and due dates:  Health Maintenance  Topic Date Due   Eye exam for diabetics  05/30/2019   COVID-19 Vaccine (4 - Pfizer risk series) 04/29/2020   Complete foot exam   10/01/2021   Flu Shot  10/18/2021   Hemoglobin A1C  02/01/2022   Yearly kidney health urinalysis for diabetes  08/02/2022   Cologuard (Stool DNA test)  10/08/2022   Yearly kidney function blood test for diabetes  10/19/2022   Medicare Annual Wellness Visit  02/04/2023   Mammogram  04/15/2023   Pneumonia Vaccine  Completed   DEXA scan (bone density measurement)  Completed   Hepatitis C Screening: USPSTF Recommendation to screen - Ages 81-79 yo.  Completed   HPV Vaccine  Aged Out   Colon Cancer Screening  Discontinued   Zoster (Shingles) Vaccine  Discontinued

## 2022-02-21 ENCOUNTER — Encounter: Payer: Self-pay | Admitting: Internal Medicine

## 2022-02-21 ENCOUNTER — Ambulatory Visit: Payer: Medicare PPO | Admitting: Internal Medicine

## 2022-02-21 VITALS — BP 140/60 | HR 96 | Temp 98.0°F | Ht 61.0 in | Wt 137.0 lb

## 2022-02-21 DIAGNOSIS — Z0001 Encounter for general adult medical examination with abnormal findings: Secondary | ICD-10-CM

## 2022-02-21 DIAGNOSIS — E1122 Type 2 diabetes mellitus with diabetic chronic kidney disease: Secondary | ICD-10-CM

## 2022-02-21 DIAGNOSIS — I10 Essential (primary) hypertension: Secondary | ICD-10-CM

## 2022-02-21 DIAGNOSIS — R0602 Shortness of breath: Secondary | ICD-10-CM | POA: Diagnosis not present

## 2022-02-21 DIAGNOSIS — E871 Hypo-osmolality and hyponatremia: Secondary | ICD-10-CM

## 2022-02-21 DIAGNOSIS — N1831 Chronic kidney disease, stage 3a: Secondary | ICD-10-CM

## 2022-02-21 LAB — CBC
HCT: 36.4 % (ref 36.0–46.0)
Hemoglobin: 12.3 g/dL (ref 12.0–15.0)
MCHC: 33.7 g/dL (ref 30.0–36.0)
MCV: 83.5 fl (ref 78.0–100.0)
Platelets: 332 10*3/uL (ref 150.0–400.0)
RBC: 4.36 Mil/uL (ref 3.87–5.11)
RDW: 13.2 % (ref 11.5–15.5)
WBC: 6.6 10*3/uL (ref 4.0–10.5)

## 2022-02-21 LAB — POCT GLYCOSYLATED HEMOGLOBIN (HGB A1C): HbA1c POC (<> result, manual entry): 5.8 % (ref 4.0–5.6)

## 2022-02-21 LAB — COMPREHENSIVE METABOLIC PANEL
ALT: 14 U/L (ref 0–35)
AST: 21 U/L (ref 0–37)
Albumin: 4.4 g/dL (ref 3.5–5.2)
Alkaline Phosphatase: 73 U/L (ref 39–117)
BUN: 12 mg/dL (ref 6–23)
CO2: 30 mEq/L (ref 19–32)
Calcium: 9.9 mg/dL (ref 8.4–10.5)
Chloride: 98 mEq/L (ref 96–112)
Creatinine, Ser: 1.16 mg/dL (ref 0.40–1.20)
GFR: 46.68 mL/min — ABNORMAL LOW (ref >60.00)
Glucose, Bld: 102 mg/dL — ABNORMAL HIGH (ref 70–99)
Potassium: 4.2 mEq/L (ref 3.5–5.1)
Sodium: 133 mEq/L — ABNORMAL LOW (ref 135–145)
Total Bilirubin: 0.5 mg/dL (ref 0.2–1.2)
Total Protein: 7.3 g/dL (ref 6.0–8.3)

## 2022-02-21 MED ORDER — AZITHROMYCIN 250 MG PO TABS: ORAL_TABLET | ORAL | 0 refills | Status: AC

## 2022-02-21 NOTE — Progress Notes (Signed)
   Subjective:   Patient ID: Vicki Perry, female    DOB: 11/15/1950, 71 y.o.   MRN: [id number]  HPI The patient is here for physical with acute concerns. Covid end of September and SOB since that time.  PMH, [facility], social history reviewed and updated  Review of Systems  Constitutional:  Positive for activity change.  HENT: Negative.    Eyes: Negative.   Respiratory:  Positive for shortness of breath. Negative for cough and chest tightness.   Cardiovascular:  Negative for chest pain, palpitations and leg swelling.  Gastrointestinal:  Negative for abdominal distention, abdominal pain, constipation, diarrhea, nausea and vomiting.  Musculoskeletal: Negative.   Skin: Negative.   Neurological: Negative.   Psychiatric/Behavioral: Negative.      Objective:  Physical Exam Constitutional:      Appearance: She is well-developed.  HENT:     Head: Normocephalic and atraumatic.  Cardiovascular:     Rate and Rhythm: Normal rate and regular rhythm.  Pulmonary:     Effort: Pulmonary effort is normal. No respiratory distress.     Breath sounds: Rhonchi present. No wheezing or rales.     Comments: Rhonchi at both bases Abdominal:     General: Bowel sounds are normal. There is no distension.     Palpations: Abdomen is soft.     Tenderness: There is no abdominal tenderness. There is no rebound.  Musculoskeletal:     Cervical back: Normal range of motion.  Skin:    General: Skin is warm and dry.  Neurological:     Mental Status: She is alert and oriented to person, place, and time.     Coordination: Coordination normal.     Vitals:   02/21/22 0914 02/21/22 0920 02/21/22 0921  BP: (!) 140/60 138/60 (!) 140/60  Pulse: 96    Temp: 98 F (36.7 C)    TempSrc: Oral    SpO2: 99%    Weight: 137 lb (62.1 kg)    Height: 5\' 1"  (1.549 m)      Assessment & Plan:

## 2022-02-21 NOTE — Assessment & Plan Note (Signed)
New since covid-19 and rhonchi on exam. Suspect secondary bacterial atypical pneumonia. Rx azithromycin. If no improvement needs CXR. Using albuterol inhaler she has leftover prn before activity.

## 2022-02-21 NOTE — Assessment & Plan Note (Signed)
POC HgA1c after stopped metformin about 6 months ago is good today at 5.9. She has made sustained diet/exercise changes and encouraged to continue. On ARB and statin. Foot exam done.

## 2022-02-21 NOTE — Assessment & Plan Note (Signed)
Flu shot up to date. Pneumonia complete. Shingrix declines. Tetanus up to date. Cologuard up to date. Mammogram up to date, pap smear up to date and dexa up to date. Counseled about sun safety and mole surveillance. Counseled about the dangers of distracted driving. Given 10 year screening recommendations.

## 2022-02-21 NOTE — Assessment & Plan Note (Signed)
Checking CMP to assess today and adjust as needed.

## 2022-02-21 NOTE — Patient Instructions (Signed)
I have sent in azithromycin to take 2 pills 1st day. Then starting 2nd day take 1 pill a day until gone.

## 2022-02-21 NOTE — Assessment & Plan Note (Signed)
Checking CMP today

## 2022-02-21 NOTE — Assessment & Plan Note (Signed)
BP at goal on hydralazine 50 mg TID and amlodipine 5 mg daily and losartan 100 mg daily. Adjust as needed after CMP.

## 2022-03-01 ENCOUNTER — Other Ambulatory Visit: Payer: Self-pay | Admitting: Internal Medicine

## 2022-03-01 DIAGNOSIS — Z1231 Encounter for screening mammogram for malignant neoplasm of breast: Secondary | ICD-10-CM

## 2022-03-24 ENCOUNTER — Other Ambulatory Visit: Payer: Self-pay | Admitting: Internal Medicine

## 2022-03-27 DIAGNOSIS — R350 Frequency of micturition: Secondary | ICD-10-CM | POA: Diagnosis not present

## 2022-03-27 DIAGNOSIS — N390 Urinary tract infection, site not specified: Secondary | ICD-10-CM | POA: Diagnosis not present

## 2022-04-25 ENCOUNTER — Ambulatory Visit
Admission: RE | Admit: 2022-04-25 | Discharge: 2022-04-25 | Disposition: A | Discharge status: Home / Self Care | Payer: Medicare PPO | Source: Ambulatory Visit | Attending: Internal Medicine | Admitting: Internal Medicine

## 2022-04-25 DIAGNOSIS — Z1231 Encounter for screening mammogram for malignant neoplasm of breast: Secondary | ICD-10-CM

## 2022-04-27 ENCOUNTER — Encounter: Payer: Self-pay | Admitting: Internal Medicine

## 2022-04-27 LAB — HM MAMMOGRAPHY

## 2022-05-11 DIAGNOSIS — E119 Type 2 diabetes mellitus without complications: Secondary | ICD-10-CM | POA: Diagnosis not present

## 2022-05-11 LAB — HM DIABETES EYE EXAM

## 2022-05-17 ENCOUNTER — Encounter: Payer: Self-pay | Admitting: Internal Medicine

## 2022-06-05 ENCOUNTER — Other Ambulatory Visit: Payer: Self-pay | Admitting: Cardiology

## 2022-06-28 ENCOUNTER — Other Ambulatory Visit: Payer: Self-pay | Admitting: Cardiology

## 2022-07-17 ENCOUNTER — Other Ambulatory Visit: Payer: Self-pay | Admitting: Cardiology

## 2022-08-02 ENCOUNTER — Other Ambulatory Visit: Payer: Self-pay | Admitting: Cardiology

## 2022-08-28 ENCOUNTER — Ambulatory Visit: Payer: Medicare PPO | Admitting: Internal Medicine

## 2022-08-29 ENCOUNTER — Encounter: Payer: Self-pay | Admitting: Internal Medicine

## 2022-08-29 ENCOUNTER — Ambulatory Visit: Payer: Medicare PPO | Admitting: Internal Medicine

## 2022-08-29 VITALS — BP 140/80 | HR 69 | Temp 97.7°F | Ht 61.0 in | Wt 140.0 lb

## 2022-08-29 DIAGNOSIS — N183 Chronic kidney disease, stage 3 unspecified: Secondary | ICD-10-CM | POA: Diagnosis not present

## 2022-08-29 DIAGNOSIS — D509 Iron deficiency anemia, unspecified: Secondary | ICD-10-CM

## 2022-08-29 DIAGNOSIS — R0609 Other forms of dyspnea: Secondary | ICD-10-CM

## 2022-08-29 DIAGNOSIS — R0602 Shortness of breath: Secondary | ICD-10-CM

## 2022-08-29 DIAGNOSIS — E1169 Type 2 diabetes mellitus with other specified complication: Secondary | ICD-10-CM | POA: Diagnosis not present

## 2022-08-29 DIAGNOSIS — E1122 Type 2 diabetes mellitus with diabetic chronic kidney disease: Secondary | ICD-10-CM | POA: Diagnosis not present

## 2022-08-29 DIAGNOSIS — R002 Palpitations: Secondary | ICD-10-CM

## 2022-08-29 DIAGNOSIS — E785 Hyperlipidemia, unspecified: Secondary | ICD-10-CM | POA: Diagnosis not present

## 2022-08-29 DIAGNOSIS — K219 Gastro-esophageal reflux disease without esophagitis: Secondary | ICD-10-CM

## 2022-08-29 DIAGNOSIS — N1831 Chronic kidney disease, stage 3a: Secondary | ICD-10-CM

## 2022-08-29 DIAGNOSIS — I10 Essential (primary) hypertension: Secondary | ICD-10-CM | POA: Diagnosis not present

## 2022-08-29 LAB — COMPREHENSIVE METABOLIC PANEL
ALT: 11 U/L (ref 0–35)
AST: 19 U/L (ref 0–37)
Albumin: 4.2 g/dL (ref 3.5–5.2)
Alkaline Phosphatase: 70 U/L (ref 39–117)
BUN: 13 mg/dL (ref 6–23)
CO2: 30 mEq/L (ref 19–32)
Calcium: 9.8 mg/dL (ref 8.4–10.5)
Chloride: 102 mEq/L (ref 96–112)
Creatinine, Ser: 1.15 mg/dL (ref 0.40–1.20)
GFR: 47 mL/min — ABNORMAL LOW (ref >60.00)
Glucose, Bld: 101 mg/dL — ABNORMAL HIGH (ref 70–99)
Potassium: 4.4 mEq/L (ref 3.5–5.1)
Sodium: 136 mEq/L (ref 135–145)
Total Bilirubin: 0.5 mg/dL (ref 0.2–1.2)
Total Protein: 7.3 g/dL (ref 6.0–8.3)

## 2022-08-29 LAB — MICROALBUMIN / CREATININE URINE RATIO
Creatinine,U: 23 mg/dL
Microalb Creat Ratio: 3 mg/g (ref 0.0–30.0)
Microalb, Ur: <0.7 mg/dL (ref 0.0–1.9)

## 2022-08-29 LAB — LIPID PANEL
Cholesterol: 163 mg/dL (ref 0–200)
HDL: 49.7 mg/dL (ref >39.00)
LDL Cholesterol: 96 mg/dL (ref 0–99)
NonHDL: 112.96
Total CHOL/HDL Ratio: 3
Triglycerides: 84 mg/dL (ref 0.0–149.0)
VLDL: 16.8 mg/dL (ref 0.0–40.0)

## 2022-08-29 LAB — CBC
HCT: 36.5 % (ref 36.0–46.0)
Hemoglobin: 12 g/dL (ref 12.0–15.0)
MCHC: 32.9 g/dL (ref 30.0–36.0)
MCV: 84.4 fl (ref 78.0–100.0)
Platelets: 310 10*3/uL (ref 150.0–400.0)
RBC: 4.33 Mil/uL (ref 3.87–5.11)
RDW: 13.1 % (ref 11.5–15.5)
WBC: 6.7 10*3/uL (ref 4.0–10.5)

## 2022-08-29 LAB — HEMOGLOBIN A1C: Hgb A1c MFr Bld: 6.1 % (ref 4.6–6.5)

## 2022-08-29 MED ORDER — ATORVASTATIN CALCIUM 20 MG PO TABS: 20.0000 mg | ORAL_TABLET | Freq: Every day | ORAL | 3 refills | Status: DC

## 2022-08-29 MED ORDER — HYDRALAZINE HCL 50 MG PO TABS: 50.0000 mg | ORAL_TABLET | Freq: Two times a day (BID) | ORAL | 3 refills | Status: DC

## 2022-08-29 NOTE — Assessment & Plan Note (Signed)
Due for labs today and prior was in pre-diabetes range without medications. If still in pre-diabetes range can change diabetes to in remission as she is not on medications and has made lifestyle changes which have been maintained for some time. She is off statin which was refilled today. On ARB. Checking HgA1c, lipid panel, microalbumin to creatinine ratio.

## 2022-08-29 NOTE — Assessment & Plan Note (Signed)
Overall controlled on no meds but does have morning cough which could be related. She does not need medication currently.

## 2022-08-29 NOTE — Assessment & Plan Note (Signed)
Checking lipid panel and she was off lipitor 20 mg daily which was refilled and she was encouraged to resume today.

## 2022-08-29 NOTE — Progress Notes (Signed)
Cardiology Clinic Note   Patient Name: Vicki Perry Date of Encounter: 09/07/2022  Primary Care Provider:  Myrlene Broker, MD Primary Cardiologist:  [name], MD  Patient Profile    72 year old female with history of hyperlipidemia, hypertension, and Type 2 diabetes seen initially by Dr. Bjorn Pippin on 12/15/2020 in the setting of chest discomfort and left arm pain.  Coronary CTA on 07/22/2019 revealed a calcium score of 71 with minimal nonobstructive CAD.  Symptoms improved with use of H2 blocker.   Past Medical History    Past Medical History:  Diagnosis Date   Anemia    CAD (coronary artery disease)    Diabetes mellitus    High cholesterol    Hypertension    Past Surgical History:  Procedure Laterality Date   CATARACT EXTRACTION, BILATERAL Bilateral 03/22/2018   CESAREAN SECTION     FOOT SURGERY Right    Bone Spur removal   TONSILLECTOMY     WISDOM TOOTH EXTRACTION      Allergies  Allergies  Allergen Reactions   Codeine Nausea Only    History of Present Illness    Mrs. Vicki Perry returns to the office today for ongoing assessment and management of hypertension, hyperlipidemia (not taking statin as she did not have any refills), with complaints of racing heart rate and shortness of breath which has been occurring for the last 2 to 3 months, more often, without exertion.  Patient was seen by primary care provider Hillard Danker, MD on 08/29/2022 with these complaints.  EKG revealed a heart rate of 64 with some PVCs.  She has 3 main complaints today on this office visit.  She is still having near syncope at least 2-3 times within the last month.  She is also been having fluttering in her chest that is been transient but becoming more frequent over the last 3 weeks, and also has been having some left arm achiness and pain with associated fatigue.  She did state her energy level has really gone down over the last few months which has been concerning for her.   She normally goes to the gym at least twice a week walking 3 miles a day so she has not felt as energetic enough to do that.  She is also taking care of her brother who has had a CVA and drives to [place] to see him daily to make sure he has eaten and has his medications.  Home Medications    Current Outpatient Medications  Medication Sig Dispense Refill   amLODipine (NORVASC) 5 MG tablet TAKE 1 TAB BY MOUTH DAILY**SCHEDULE OVERDUE APPT WITH DR.CAMNITZ FOR MORE REFILLS 90 tablet 1   atorvastatin (LIPITOR) 20 MG tablet Take 1 tablet (20 mg total) by mouth daily. 90 tablet 3   Calcium Carb-Cholecalciferol (CALCIUM 500/D) 500-400 MG-UNIT CHEW Chew 1 tablet by mouth daily.     DENTA 5000 PLUS 1.1 % CREA dental cream Place 1 Application onto teeth at bedtime.     glucose blood (ONETOUCH VERIO) test strip 1 each by Other route 2 (two) times a day. 200 each 2   hydrALAZINE (APRESOLINE) 50 MG tablet Take 1 tablet (50 mg total) by mouth in the morning and at bedtime. (Patient taking differently: Take 50 mg by mouth in the morning and at bedtime. Take 1 Tablet Twice Daily.) 180 tablet 3   Lancets (ONETOUCH ULTRASOFT) lancets Use as directed to test blood sugar once daily E11.9 100 each 12   losartan (COZAAR) 100 MG tablet  TAKE 1 TABLET BY MOUTH EVERY DAY 90 tablet 3   No current facility-administered medications for this visit.     Family History    Family History  Problem Relation Age of Onset   Diabetes Mother    Hypertension Mother    Cancer Mother 55       Cervical   Hypertension Father    Diabetes Maternal Grandmother    Hypertension Sister    Diabetes Sister    Hypertension Brother    Heart disease Neg Hx    Stroke Neg Hx    She indicated that her mother is alive. She indicated that her father is deceased. She indicated that the status of her sister is unknown. She indicated that the status of her brother is unknown. She indicated that the status of her maternal grandmother is  unknown. She indicated that the status of her neg hx is unknown.  Social History    Social History   Socioeconomic History   Marital status: Single    Spouse name: Not on file   Number of children: Not on file   Years of education: Not on file   Highest education level: Not on file  Occupational History   Occupation: retired from HR  Tobacco Use   Smoking status: Never   Smokeless tobacco: Never  Vaping Use   Vaping Use: Never used  Substance and Sexual Activity   Alcohol use: No   Drug use: No   Sexual activity: Not on file  Other Topics Concern   Not on file  Social History Narrative   Not on file   Social Determinants of Health   Financial Resource Strain: Low Risk  (02/03/2022)   Overall Financial Resource Strain (CARDIA)    Difficulty of Paying Living Expenses: Not hard at all  Food Insecurity: No Food Insecurity (02/03/2022)   Hunger Vital Sign    Worried About Running Out of Food in the Last Year: Never true    Ran Out of Food in the Last Year: Never true  Transportation Needs: No Transportation Needs (02/03/2022)   PRAPARE - Administrator, Civil Service (Medical): No    Lack of Transportation (Non-Medical): No  Physical Activity: Insufficiently Active (02/03/2022)   Exercise Vital Sign    Days of Exercise per Week: 2 days    Minutes of Exercise per Session: 60 min  Stress: No Stress Concern Present (02/03/2022)   [employer] of Occupational Health - Occupational Stress Questionnaire    Feeling of Stress : Not at all  Social Connections: Moderately Integrated (02/03/2022)   Social Connection and Isolation Panel [NHANES]    Frequency of Communication with Friends and Family: More than three times a week    Frequency of Social Gatherings with Friends and Family: More than three times a week    Attends Religious Services: More than 4 times per year    Active Member of [employer] or Organizations: Yes    Attends Banker Meetings: 1 to  4 times per year    Marital Status: Never married  Intimate Partner Violence: Not At Risk (02/03/2022)   Humiliation, Afraid, Rape, and Kick questionnaire    Fear of Current or Ex-Partner: No    Emotionally Abused: No    Physically Abused: No    Sexually Abused: No     Review of Systems    General:  No chills, fever, night sweats or weight changes.  Positive for generalized fatigue Cardiovascular: Positive for achy  chest pain, with left arm pain, dyspnea on exertion, edema, orthopnea, positive for fluttering palpitations, paroxysmal nocturnal dyspnea. Dermatological: No rash, lesions/masses Respiratory: No cough, dyspnea Urologic: No hematuria, dysuria Abdominal:   No nausea, vomiting, diarrhea, bright red blood per rectum, melena, or hematemesis Neurologic:  No visual changes, wkns, changes in mental status. All other systems reviewed and are otherwise negative except as noted above.     Physical Exam    VS:  BP (!) 148/74   Pulse 68   Wt 138 lb 14.4 oz (63 kg)   SpO2 97%   BMI 26.24 kg/m  , BMI Body mass index is 26.24 kg/m.     GEN: Well nourished, well developed, in no acute distress. HEENT: normal. Neck: Supple, no JVD, carotid bruits, or masses. Cardiac: RRR, no murmurs, rubs, or gallops. No clubbing, cyanosis, edema.  Radials/DP/PT 2+ and equal bilaterally.  Respiratory:  Respirations regular and unlabored, clear to auscultation bilaterally. GI: Soft, nontender, nondistended, BS + x 4. MS: no deformity or atrophy. Skin: warm and dry, no rash. Neuro:  Strength and sensation are intact. Psych: Normal affect.  Accessory Clinical Findings    ECG personally reviewed by me today-sinus rhythm, sinus bradycardia heart rate 62 bpm.  Lab Results  Component Value Date   WBC 6.7 08/29/2022   HGB 12.0 08/29/2022   HCT 36.5 08/29/2022   MCV 84.4 08/29/2022   PLT 310.0 08/29/2022   Lab Results  Component Value Date   CREATININE 1.15 08/29/2022   BUN 13 08/29/2022    NA 136 08/29/2022   K 4.4 08/29/2022   CL 102 08/29/2022   CO2 30 08/29/2022   Lab Results  Component Value Date   ALT 11 08/29/2022   AST 19 08/29/2022   ALKPHOS 70 08/29/2022   BILITOT 0.5 08/29/2022   Lab Results  Component Value Date   CHOL 163 08/29/2022   HDL 49.70 08/29/2022   LDLCALC 96 08/29/2022   TRIG 84.0 08/29/2022   CHOLHDL 3 08/29/2022    Lab Results  Component Value Date   HGBA1C 6.1 08/29/2022    Review of Prior Studies: Coronary CTA 07/22/2019 1. Coronary calcium score of 71. This was 72nd percentile for age and sex matched controls.   2. Normal coronary origin with right dominance.   3. Minimal, non-obstructive CAD (<25%).   4. Distal LAD myocardial bridge (normal variant) with limited evaluation of the distal LAD.   RECOMMENDATIONS: 1. Minimal non-obstructive CAD (0-24%). Consider preventive therapy and risk factor modification.   Lennie Odor, MD  Assessment & Plan   1.  Near syncope: She has had 3 episodes over the last month which have been transient without any loss of consciousness but have been very concerning to her.  She normally sits down or lies down and allows this to pass.  She is also noticing some heart fluttering occasionally around this episode.  But that does not always happen in the setting of the near syncope.  I will place show 2-week ZIO monitor to evaluate for arrhythmias, heart block, or atrial fibrillation.  2.  Chest pain with radiation to the left arm: Coronary CTA in 2021 revealed minimal coronary artery disease.  Will repeat this for progression of CAD with a history of hypertension, diabetes, and hyperlipidemia.  EKG does not reveal any abnormalities she is bradycardic.  3.  Hypertension: Currently not well-controlled.  I rechecked it in the office and found it to be 150/60.  She has not been taking hydralazine  as directed which should be 3 times daily.  We will begin at twice daily 50 mg and increase to 3 times daily if  blood pressures not well-controlled at home.  She is going to keep a blood pressure log and bring that back with her when she returns for office visit.  She is given a low-sodium diet.  And a salty 6 pamphlet.  4.  Hyperlipidemia: Has not been taking rosuvastatin as directed as this is most stomach upset.  Recently started it again a few days ago and again she had significant nausea.  Will reduce the dose of rosuvastatin from 20 mg daily to 10 mg daily to see if it is better tolerated.        Signed, Bettey Mare. Liborio Nixon, ANP, AACC   09/07/2022 10:33 AM      Office [phone] Fax [phone]  Notice: This dictation was prepared with Dragon dictation along with smaller phrase technology. Any transcriptional errors that result from this process are unintentional and may not be corrected upon review.

## 2022-08-29 NOTE — Progress Notes (Signed)
   Subjective:   Patient ID: Vicki Perry, female    DOB: [birth year], 72 y.o.   MRN: [id number]  HPI The patient is a 72 YO female coming in for having worsening SOB on exertion. Had an episode of heart racing with fast heart beating last night. No stress before or trigger. Was not exerting herself. Some left arm pain also recently. Has some some SOB for some time but worse in the last 2-3 months and more often. Only with exertion not at rest or with lying down. BP at goal 120s/60s has log with her. Not on statin for some time due to running out of refills.   Review of Systems  Constitutional: Negative.   HENT: Negative.    Eyes: Negative.   Respiratory:  Positive for shortness of breath. Negative for cough and chest tightness.   Cardiovascular:  Positive for palpitations. Negative for chest pain and leg swelling.  Gastrointestinal:  Negative for abdominal distention, abdominal pain, constipation, diarrhea, nausea and vomiting.  Musculoskeletal: Negative.   Skin: Negative.   Neurological: Negative.   Psychiatric/Behavioral: Negative.      Objective:  Physical Exam Constitutional:      Appearance: She is well-developed.  HENT:     Head: Normocephalic and atraumatic.  Cardiovascular:     Rate and Rhythm: Normal rate and regular rhythm.  Pulmonary:     Effort: Pulmonary effort is normal. No respiratory distress.     Breath sounds: Normal breath sounds. No wheezing or rales.  Abdominal:     General: Bowel sounds are normal. There is no distension.     Palpations: Abdomen is soft.     Tenderness: There is no abdominal tenderness. There is no rebound.  Musculoskeletal:     Cervical back: Normal range of motion.  Skin:    General: Skin is warm and dry.  Neurological:     Mental Status: She is alert and oriented to person, place, and time.     Coordination: Coordination normal.     Vitals:   08/29/22 0843 08/29/22 0846  BP: (!) 140/80 (!) 140/80  Pulse: 69   Temp: 97.7 F  (36.5 C)   TempSrc: Oral   SpO2: 99%   Weight: 140 lb (63.5 kg)   Height: 5\' 1"  (1.549 m)    EKG: Rate 64, axis normal, interval normal, sinus with some PVC, no st or t wave changes, no significant change compared to prior 2023   Assessment & Plan:

## 2022-08-29 NOTE — Assessment & Plan Note (Signed)
Checking EKG today although on ausculatation sounded normal. Referral back to cardiology as she is also having worsening/new SOB on exertion and needs assessment.

## 2022-08-29 NOTE — Assessment & Plan Note (Signed)
Checking CBC given new SOB on exertion.

## 2022-08-29 NOTE — Patient Instructions (Addendum)
We will check the labs today and get you back in with cardiology.

## 2022-08-29 NOTE — Assessment & Plan Note (Signed)
Checking CMP for stability. Should not be related to recent SOB on exertion.

## 2022-08-29 NOTE — Assessment & Plan Note (Signed)
BP running normal to low at home average 120/60. Taking hydralazine 50 mg BID and losartan 100 mg daily and amlodipine 5 mg daily.

## 2022-08-29 NOTE — Assessment & Plan Note (Signed)
New/worsening SOB on exertion and some left arm pain recently. EKG done today. Referral back to cardiology as this could be anginal equivalent and may need stress testing. Off statin currently but we are resuming today. BP at goal.

## 2022-09-07 ENCOUNTER — Ambulatory Visit: Payer: Medicare PPO | Attending: Adult Health | Admitting: Adult Health

## 2022-09-07 ENCOUNTER — Other Ambulatory Visit: Payer: Medicare PPO

## 2022-09-07 ENCOUNTER — Encounter: Payer: Self-pay | Admitting: Adult Health

## 2022-09-07 VITALS — BP 148/74 | HR 68 | Wt 138.9 lb

## 2022-09-07 DIAGNOSIS — I495 Sick sinus syndrome: Secondary | ICD-10-CM

## 2022-09-07 DIAGNOSIS — R55 Syncope and collapse: Secondary | ICD-10-CM

## 2022-09-07 DIAGNOSIS — E7800 Pure hypercholesterolemia, unspecified: Secondary | ICD-10-CM | POA: Diagnosis not present

## 2022-09-07 DIAGNOSIS — I2510 Atherosclerotic heart disease of native coronary artery without angina pectoris: Secondary | ICD-10-CM

## 2022-09-07 DIAGNOSIS — I10 Essential (primary) hypertension: Secondary | ICD-10-CM

## 2022-09-07 MED ORDER — METOPROLOL TARTRATE 25 MG PO TABS: 25.0000 mg | ORAL_TABLET | Freq: Once | ORAL | 0 refills | Status: DC

## 2022-09-07 NOTE — Progress Notes (Unsigned)
Enrolled patient for a 14 day Zio AT monitor to be mailed to patients home  [place] to read 

## 2022-09-07 NOTE — Patient Instructions (Addendum)
Medication Instructions:  Decrease Lipitor from 20 mg to 10 mg ( Take 1/2 of a 20 mg Tablet Daily). Take Hydralazine 50 mg ( Take 1 Tablet Twice Daily). Metoprolol Tartrate 25 mg ( Take 1 Tablet 90 min- 2 hours prior to Scan). *If you need a refill on your cardiac medications before your next appointment, please call your pharmacy*   Lab Work: BMET :Today If you have labs (blood work) drawn today and your tests are completely normal, you will receive your results only by: MyChart Message (if you have MyChart) OR A paper copy in the mail If you have any lab test that is abnormal or we need to change your treatment, we will call you to review the results.   Testing/Procedures: ZIO AT Long term monitor-Live Telemetry  Your physician has requested you wear a ZIO patch monitor for 14 days.  This is a single patch monitor. [employer] supplies one patch monitor per enrollment. Additional  stickers are not available.  Please do not apply patch if you will be having a Nuclear Stress Test, Echocardiogram, Cardiac CT, MRI,  or Chest Xray during the period you would be wearing the monitor. The patch cannot be worn during  these tests. You cannot remove and re-apply the ZIO AT patch monitor.  Your ZIO patch monitor will be mailed 3 day USPS to your address on file. It may take 3-5 days to  receive your monitor after you have been enrolled.  Once you have received your monitor, please review the enclosed instructions. Your monitor has  already been registered assigning a specific monitor serial # to you.   Billing and Patient Assistance Program information  Meredeth Ide has been supplied with any insurance information on record for billing. [employer] offers a sliding scale Patient Assistance Program for patients without insurance, or whose  insurance does not completely cover the cost of the ZIO patch monitor. You must apply for the  Patient Assistance Program to qualify for the discounted rate. To apply,  call [employer] at [phone],  select option 4, select option 2 , ask to apply for the Patient Assistance Program, (you can request an  interpreter if needed). [employer] will ask your household income and how many people are in your  household. [employer] will quote your out-of-pocket cost based on this information. They will also be able  to set up a 12 month interest free payment plan if needed.  Applying the monitor   Shave hair from upper left chest.  Hold the abrader disc by orange tab. Rub the abrader in 40 strokes over left upper chest as indicated in  your monitor instructions.  Clean area with 4 enclosed alcohol pads. Use all pads to ensure the area is cleaned thoroughly. Let  dry.  Apply patch as indicated in monitor instructions. Patch will be placed under collarbone on left side of  chest with arrow pointing upward.  Rub patch adhesive wings for 2 minutes. Remove the white label marked "1". Remove the white label  marked "2". Rub patch adhesive wings for 2 additional minutes.  While looking in a mirror, press and release button in center of patch. A small green light will flash 3-4  times. This will be your only indicator that the monitor has been turned on.  Do not shower for the first 24 hours. You may shower after the first 24 hours.  Press the button if you feel a symptom. You will hear a small click. Record Date, Time and Symptom in  the Patient Log.   Starting the Gateway  In your kit there is a Audiological scientist box the size of a cellphone. This is Buyer, retail. It transmits all your  recorded data to [facility]. This box must always stay within 10 feet of you. Open the box and push the *  button. There will be a light that blinks orange and then green a few times. When the light stops  blinking, the Gateway is connected to the ZIO patch. Call [employer] at [phone] to confirm your monitor is transmitting.  Returning your monitor  Remove your patch and place it inside the  Gateway. In the lower half of the Gateway there is a white  bag with prepaid postage on it. Place Gateway in bag and seal. Mail package back to [place] as soon as  possible. Your physician should have your final report approximately 7 days after you have mailed back  your monitor. Call [facility] Customer Care at [phone] if you have questions regarding your ZIO AT  patch monitor. Call them immediately if you see an orange light blinking on your monitor.  If your monitor falls off in less than 4 days, contact our Monitor department at [phone]. If your  monitor becomes loose or falls off after 4 days call [employer] at [phone] for suggestions on  securing your monitor      Your cardiac CT will be scheduled at one of the below locations:   [facility] [address] [place], [place] [address] [phone]   If scheduled at [facility], please arrive at the [facility] and Children's Entrance (Entrance C2) of [facility] 30 minutes prior to test start time. You can use the FREE valet parking offered at entrance C (encouraged to control the heart rate for the test)  Proceed to the [facility] Radiology Department (first floor) to check-in and test prep.  All radiology patients and guests should use entrance C2 at [facility], accessed from [facility], even though the hospital's physical address listed is [address].    If scheduled at [facility] or [facility], please arrive 15 mins early for check-in and test prep.   On the Night Before the Test: Be sure to Drink plenty of water. Do not consume any caffeinated/decaffeinated beverages or chocolate 12 hours prior to your test. Do not take any antihistamines 12 hours prior to your test. If the patient has contrast allergy: On the Day of the Test: Drink plenty of water until 1 hour prior to the test. Do  not eat any food 1 hour prior to test. You may take your regular medications prior to the test.  Take metoprolol (Lopressor) two hours prior to test. If you take Furosemide/Hydrochlorothiazide/Spironolactone, please HOLD on the morning of the test. FEMALES- please wear underwire-free bra if available, avoid dresses & tight clothing       After the Test: Drink plenty of water. After receiving IV contrast, you may experience a mild flushed feeling. This is normal. On occasion, you may experience a mild rash up to 24 hours after the test. This is not dangerous. If this occurs, you can take Benadryl 25 mg and increase your fluid intake. If you experience trouble breathing, this can be serious. If it is severe call 911 IMMEDIATELY. If it is mild, please call our office. If you take any of these medications: Glipizide/Metformin, Avandament, Glucavance, please do not take 48 hours after  completing test unless otherwise instructed.  We will call to schedule your test 2-4 weeks out understanding that some insurance companies will need an authorization prior to the service being performed.   For non-scheduling related questions, please contact the cardiac imaging nurse navigator should you have any questions/concerns: Rockwell Alexandria, Cardiac Imaging Nurse Navigator Larey Brick, Cardiac Imaging Nurse Navigator [facility] Direct Office Dial: [phone]   For scheduling needs, including cancellations and rescheduling, please call [place], [phone].   Your next appointment:   6 week(s)  Provider:   [name], DNP, ANP

## 2022-09-08 ENCOUNTER — Telehealth: Payer: Self-pay

## 2022-09-08 LAB — BASIC METABOLIC PANEL
BUN/Creatinine Ratio: 10 — ABNORMAL LOW (ref 12–28)
BUN: 12 mg/dL (ref 8–27)
CO2: 23 mmol/L (ref 20–29)
Calcium: 10.8 mg/dL — ABNORMAL HIGH (ref 8.7–10.3)
Chloride: 101 mmol/L (ref 96–106)
Creatinine, Ser: 1.22 mg/dL — ABNORMAL HIGH (ref 0.57–1.00)
Glucose: 104 mg/dL — ABNORMAL HIGH (ref 70–99)
Potassium: 4.8 mmol/L (ref 3.5–5.2)
Sodium: 138 mmol/L (ref 134–144)
eGFR: 47 mL/min/{1.73_m2} — ABNORMAL LOW (ref >59)

## 2022-09-08 NOTE — Telephone Encounter (Addendum)
Called patient regarding results. Patient had understanding of results.----- Message from Jodelle Gross, NP sent at 09/08/2022  7:08 AM EDT ----- I have reviewed her labs. Creatinine is slightly elevated. Should drink more water and stay hydrated. Will await coronary CTA.

## 2022-09-13 ENCOUNTER — Telehealth: Payer: Self-pay | Admitting: Cardiology

## 2022-09-13 NOTE — Telephone Encounter (Signed)
Pt calling regarding the ZIO monitor and pt states she would rather do stress test instead of heart  monitor. She would like a callback regarding this matter. Please advise.

## 2022-09-13 NOTE — Telephone Encounter (Signed)
Patient states she decided not to do the heart monitor and wanted to know if she can do the stress test instead?

## 2022-09-14 NOTE — Telephone Encounter (Signed)
Appears monitor was ordered by [name] at recent visit for near syncope.  Recommend proceeding with monitor.  Does not need stress testSamara Deist ordered coronary CTA instead, which is a better test for evaluating for heart artery blockages

## 2022-09-14 NOTE — Telephone Encounter (Signed)
Patient states she thought Samara Deist informed her she had an option either the monitor or the stress test.   I did inform her of Dr. Bjorn Pippin recommendation. She states she will wear the monitor for 14 days. She does not want to do that.

## 2022-09-14 NOTE — Telephone Encounter (Signed)
Pt states she is not going to wear heart monitor and would like a callback regarding this matter. Please advise.

## 2022-09-14 NOTE — Telephone Encounter (Signed)
Pt returning call   Pt ask that you call [phone]

## 2022-09-14 NOTE — Telephone Encounter (Signed)
Unable to leave voicemail   Phone just rings.

## 2022-09-15 ENCOUNTER — Telehealth: Payer: Self-pay | Admitting: Cardiology

## 2022-09-15 NOTE — Telephone Encounter (Signed)
Left voicemail to return call to office.

## 2022-09-15 NOTE — Telephone Encounter (Signed)
DUPLICATE encounter.  Please see other encounter for review

## 2022-09-15 NOTE — Telephone Encounter (Signed)
Patient refuses the monitor.  She gets upset and states "I have had several conversations about this and I am not going to wear it" I advised I am just returning the message.  She states again she is not going to wear it and sent it back to the company yesterday.

## 2022-09-15 NOTE — Telephone Encounter (Signed)
Follow Up:     Patient is retuning a call from today. 

## 2022-10-02 ENCOUNTER — Telehealth (HOSPITAL_COMMUNITY): Payer: Self-pay | Admitting: *Deleted

## 2022-10-02 ENCOUNTER — Encounter (HOSPITAL_COMMUNITY): Payer: Self-pay

## 2022-10-02 NOTE — Telephone Encounter (Signed)
Reaching out to patient to offer assistance regarding upcoming cardiac imaging study; pt verbalizes understanding of appt date/time, parking situation and where to check in, pre-test NPO status and medications ordered, and verified current allergies; name and call back number provided for further questions should they arise Hayley Sharpe RN Navigator Cardiac [facility] [phone] office [phone] cell  

## 2022-10-03 ENCOUNTER — Ambulatory Visit (HOSPITAL_COMMUNITY)
Admission: RE | Admit: 2022-10-03 | Discharge: 2022-10-03 | Disposition: A | Discharge status: Home / Self Care | Payer: Medicare PPO | Source: Ambulatory Visit | Attending: Adult Health | Admitting: Adult Health

## 2022-10-03 DIAGNOSIS — R55 Syncope and collapse: Secondary | ICD-10-CM | POA: Insufficient documentation

## 2022-10-03 DIAGNOSIS — I2510 Atherosclerotic heart disease of native coronary artery without angina pectoris: Secondary | ICD-10-CM | POA: Diagnosis not present

## 2022-10-03 DIAGNOSIS — I10 Essential (primary) hypertension: Secondary | ICD-10-CM | POA: Diagnosis not present

## 2022-10-03 DIAGNOSIS — I495 Sick sinus syndrome: Secondary | ICD-10-CM | POA: Insufficient documentation

## 2022-10-03 MED ORDER — NITROGLYCERIN 0.4 MG SL SUBL
0.8000 mg | SUBLINGUAL_TABLET | Freq: Once | SUBLINGUAL | Status: AC
  Administered 2022-10-03: 0.8 mg via SUBLINGUAL

## 2022-10-03 MED ORDER — IOHEXOL 350 MG/ML SOLN
95.0000 mL | Freq: Once | INTRAVENOUS | Status: AC | PRN
  Administered 2022-10-03: 95 mL via INTRAVENOUS

## 2022-10-03 MED ORDER — NITROGLYCERIN 0.4 MG SL SUBL
SUBLINGUAL_TABLET | SUBLINGUAL | Status: AC
  Filled 2022-10-03: qty 2

## 2022-10-05 ENCOUNTER — Ambulatory Visit: Payer: Medicare PPO | Admitting: Physician Assistant

## 2022-10-10 ENCOUNTER — Telehealth: Payer: Self-pay

## 2022-10-10 NOTE — Telephone Encounter (Addendum)
Called patient regarding results. Patient had understanding of results.----- Message from [name] sent at 10/03/2022 10:22 AM EDT ----- I have reviewed the coronary CTA results. He has a low calcium score, only minimal CAD is seen in the left anterior descending artery.  Not concerning. Continue to eat healthy, avoid high cholesterol foods.  No changes in his regimen.

## 2022-10-10 NOTE — Progress Notes (Signed)
Cardiology Clinic Note   Patient Name: Vicki Perry Date of Encounter: 10/16/2022  Primary Care Provider:  Myrlene Broker, MD Primary Cardiologist:  [name], MD  Patient Profile    72 year old female with known history of coronary artery disease, coronary CT scan on 07/22/2019 revealing calcium score of 71 with minimal nonobstructive disease, Hyperlipidemia, hypertension, and type 2 diabetes.  Last seen in the office on 09/07/2022 with complaints of racing heart rate and shortness of breath, occurring more often without exertion.  She also complains near syncope 2-3 times a month along with fluttering in her chest, and left arm pain.  She normally went to the gym at least twice a week and does walk 3 miles a day up until this time of these new symptoms.  Past Medical History    Past Medical History:  Diagnosis Date   Anemia    CAD (coronary artery disease)    Diabetes mellitus    High cholesterol    Hypertension    Past Surgical History:  Procedure Laterality Date   CATARACT EXTRACTION, BILATERAL Bilateral 03/22/2018   CESAREAN SECTION     FOOT SURGERY Right    Bone Spur removal   TONSILLECTOMY     WISDOM TOOTH EXTRACTION      Allergies  Allergies  Allergen Reactions   Codeine Nausea Only    History of Present Illness    Vicki Perry returns today for ongoing assessment and management of near syncope, recurrent chest pain, hypertension and hyperlipidemia.  On last office visit dated 09/07/2022 I placed a ZIO monitor for 2 weeks to evaluate for arrhythmias heart block or atrial fibrillation due to her symptoms of recurrent palpitations and heart racing.    I also repeated coronary CTA to evaluate for progression of coronary artery disease with multiple cardiovascular risk factors.  She was advised to keep a blood pressure log at home.  I also restarted her rosuvastatin to 10 mg daily as she was unable to tolerate higher doses due to nausea.  Coronary  CTA revealed total calcium score of 111, she had mild soft plaque in the mid left circumflex with minimal stenosis in the distal left main and mid LAD.  She was also found to have aortic atherosclerosis.  She comes today without any cardiac complaints.  She remains very busy taking care of her brother, helping out in her church, goes to the [facility] twice a week to walk on a treadmill.  Blood pressure has been well-controlled at home since being seen last.  I have reviewed her blood pressure recordings at home and they are within normal limits with good trends in the morning and in the afternoon.  She chose not to wear the ZIO monitor.  Home Medications    Current Outpatient Medications  Medication Sig Dispense Refill   atorvastatin (LIPITOR) 20 MG tablet Take 1 tablet (20 mg total) by mouth daily. (Patient taking differently: Take 10 mg by mouth daily. Take 1/2 Tablet Daily) 90 tablet 3   Calcium Carb-Cholecalciferol (CALCIUM 500/D) 500-400 MG-UNIT CHEW Chew 1 tablet by mouth daily.     glucose blood (ONETOUCH VERIO) test strip 1 each by Other route 2 (two) times a day. 200 each 2   hydrALAZINE (APRESOLINE) 50 MG tablet Take 1 tablet (50 mg total) by mouth in the morning and at bedtime. (Patient taking differently: Take 50 mg by mouth in the morning and at bedtime. Take 1 Tablet Twice Daily.) 180 tablet 3   Lancets (  ONETOUCH ULTRASOFT) lancets Use as directed to test blood sugar once daily E11.9 100 each 12   losartan (COZAAR) 100 MG tablet TAKE 1 TABLET BY MOUTH EVERY DAY 90 tablet 3   amLODipine (NORVASC) 5 MG tablet TAKE 1 TAB BY MOUTH DAILY**SCHEDULE OVERDUE APPT WITH DR.CAMNITZ FOR MORE REFILLS (Patient not taking: Reported on 10/16/2022) 90 tablet 1   DENTA 5000 PLUS 1.1 % CREA dental cream Place 1 Application onto teeth at bedtime. (Patient not taking: Reported on 10/16/2022)     metoprolol tartrate (LOPRESSOR) 25 MG tablet Take 1 tablet (25 mg total) by mouth once for 1 dose. Take 90 minutes to 2  hours prior to Scan 1 tablet 0   No current facility-administered medications for this visit.     Family History    Family History  Problem Relation Age of Onset   Diabetes Mother    Hypertension Mother    Cancer Mother 13       Cervical   Hypertension Father    Diabetes Maternal Grandmother    Hypertension Sister    Diabetes Sister    Hypertension Brother    Heart disease Neg Hx    Stroke Neg Hx    She indicated that her mother is alive. She indicated that her father is deceased. She indicated that the status of her sister is unknown. She indicated that the status of her brother is unknown. She indicated that the status of her maternal grandmother is unknown. She indicated that the status of her neg hx is unknown.  Social History    Social History   Socioeconomic History   Marital status: Single    Spouse name: Not on file   Number of children: Not on file   Years of education: Not on file   Highest education level: Not on file  Occupational History   Occupation: retired from HR  Tobacco Use   Smoking status: Never   Smokeless tobacco: Never  Vaping Use   Vaping status: Never Used  Substance and Sexual Activity   Alcohol use: No   Drug use: No   Sexual activity: Not on file  Other Topics Concern   Not on file  Social History Narrative   Not on file   Social Determinants of Health   Financial Resource Strain: Low Risk  (02/03/2022)   Overall Financial Resource Strain (CARDIA)    Difficulty of Paying Living Expenses: Not hard at all  Food Insecurity: No Food Insecurity (02/03/2022)   Hunger Vital Sign    Worried About Running Out of Food in the Last Year: Never true    Ran Out of Food in the Last Year: Never true  Transportation Needs: No Transportation Needs (02/03/2022)   PRAPARE - Administrator, Civil Service (Medical): No    Lack of Transportation (Non-Medical): No  Physical Activity: Insufficiently Active (02/03/2022)   Exercise Vital  Sign    Days of Exercise per Week: 2 days    Minutes of Exercise per Session: 60 min  Stress: No Stress Concern Present (02/03/2022)   [employer] of Occupational Health - Occupational Stress Questionnaire    Feeling of Stress : Not at all  Social Connections: Moderately Integrated (02/03/2022)   Social Connection and Isolation Panel [NHANES]    Frequency of Communication with Friends and Family: More than three times a week    Frequency of Social Gatherings with Friends and Family: More than three times a week    Attends Religious Services:  More than 4 times per year    Active Member of Clubs or Organizations: Yes    Attends Club or Organization Meetings: 1 to 4 times per year    Marital Status: Never married  Intimate Partner Violence: Not At Risk (02/03/2022)   Humiliation, Afraid, Rape, and Kick questionnaire    Fear of Current or Ex-Partner: No    Emotionally Abused: No    Physically Abused: No    Sexually Abused: No     Review of Systems    General:  No chills, fever, night sweats or weight changes.  Cardiovascular:  No chest pain, dyspnea on exertion, edema, orthopnea, palpitations, paroxysmal nocturnal dyspnea. Dermatological: No rash, lesions/masses Respiratory: No cough, dyspnea Urologic: No hematuria, dysuria Abdominal:   No nausea, vomiting, diarrhea, bright red blood per rectum, melena, or hematemesis Neurologic:  No visual changes, wkns, changes in mental status. All other systems reviewed and are otherwise negative except as noted above.       Physical Exam    VS:  BP 138/66 (BP Location: Left Arm, Patient Position: Sitting, Cuff Size: Normal)   Pulse 81   Ht 5\' 1"  (1.549 m)   Wt 141 lb 12.8 oz (64.3 kg)   SpO2 90%   BMI 26.79 kg/m  , BMI Body mass index is 26.79 kg/m.     GEN: Well nourished, well developed, in no acute distress. HEENT: normal. Neck: Supple, no JVD, carotid bruits, or masses. Cardiac: RRR, no murmurs, rubs, or gallops. No  clubbing, cyanosis, edema.  Radials/DP/PT 2+ and equal bilaterally.  Respiratory:  Respirations regular and unlabored, clear to auscultation bilaterally. GI: Soft, nontender, nondistended, BS + x 4. MS: no deformity or atrophy. Skin: warm and dry, no rash. Neuro:  Strength and sensation are intact. Psych: Normal affect.      Lab Results  Component Value Date   WBC 6.7 08/29/2022   HGB 12.0 08/29/2022   HCT 36.5 08/29/2022   MCV 84.4 08/29/2022   PLT 310.0 08/29/2022   Lab Results  Component Value Date   CREATININE 1.22 (H) 09/07/2022   BUN 12 09/07/2022   NA 138 09/07/2022   K 4.8 09/07/2022   CL 101 09/07/2022   CO2 23 09/07/2022   Lab Results  Component Value Date   ALT 11 08/29/2022   AST 19 08/29/2022   ALKPHOS 70 08/29/2022   BILITOT 0.5 08/29/2022   Lab Results  Component Value Date   CHOL 163 08/29/2022   HDL 49.70 08/29/2022   LDLCALC 96 08/29/2022   TRIG 84.0 08/29/2022   CHOLHDL 3 08/29/2022    Lab Results  Component Value Date   HGBA1C 6.1 08/29/2022     Review of Prior Studies    Coronary CTA: 10/03/2022-Dr. Crenshaw IMPRESSION: 1. Coronary calcium score of 111. This was 60 percentile for age-, sex, and race-matched controls.   2. Total plaque volume 103 mm3 which is 29 percentile for age- and sex-matched controls (calcified plaque 22 mm3; non-calcified plaque 81 mm3). TPV is (moderate).   3. Normal coronary origin with right dominance.   4. Mild (25-49) soft plaque in the mid Lcx and minimal stenoses in the distal LM and mid LAD.   5. Aortic atherosclerosis.  Assessment & Plan   1.  Hypertension: Blood pressure is very well-controlled at home currently.  I have reviewed her trends on home blood pressure monitoring.  Would continue her current medication regimen of hydralazine in the morning and evening and half a tablet in  the middle of the day if blood pressure is elevated.  Continue losartan 100 mg daily she is no longer on amlodipine.   She will continue metoprolol as directed.  2.  Coronary calcium noted on CT: Coronary calcium score 111 with mild soft plaque in the mid left circumflex and minimal stenosis in the distal left main and LAD.  She will continue statin therapy, she is on atorvastatin 10 mg daily, would like to increase it to 20 mg daily, but will monitor labs.  As coronary calcium score is low I do not feel it necessary to titrate up at this time.       Signed, Bettey Mare. Liborio Nixon, ANP, AACC   10/16/2022 12:32 PM      Office [phone] Fax [phone]  Notice: This dictation was prepared with Dragon dictation along with smaller phrase technology. Any transcriptional errors that result from this process are unintentional and may not be corrected upon review.

## 2022-10-11 ENCOUNTER — Telehealth: Payer: Self-pay

## 2022-10-11 NOTE — Telephone Encounter (Signed)
Patient states she has spoken with staff and confirmed her F/U appt. Nothing else is needed

## 2022-10-11 NOTE — Telephone Encounter (Addendum)
Patient returned call regarding results. Patient had understanding of results.----- Message from [name] sent at 10/11/2022  7:01 AM EDT ----- I have reviewed the CT of her coronaries. She has minimal CAD and not worrisome.  The dizziness and near syncope, the chest fluttering, and the left arm pain are not related to heart blockages. She is to continue the atorvastatin as directed.  Exercise and low cholesterol diet is also recommended.    Samara Deist

## 2022-10-11 NOTE — Telephone Encounter (Signed)
Patient was returning call. Please advise ?

## 2022-10-11 NOTE — Telephone Encounter (Addendum)
Called patient regarding results. Left message for patient to call office.----- Message from [name] sent at 10/11/2022  7:01 AM EDT ----- I have reviewed the CT of her coronaries. She has minimal CAD and not worrisome.  The dizziness and near syncope, the chest fluttering, and the left arm pain are not related to heart blockages. She is to continue the atorvastatin as directed.  Exercise and low cholesterol diet is also recommended.    Vicki Perry

## 2022-10-16 ENCOUNTER — Ambulatory Visit: Payer: Medicare PPO | Attending: Adult Health | Admitting: Adult Health

## 2022-10-16 ENCOUNTER — Encounter: Payer: Self-pay | Admitting: Adult Health

## 2022-10-16 VITALS — BP 138/66 | HR 81 | Ht 61.0 in | Wt 141.8 lb

## 2022-10-16 DIAGNOSIS — R931 Abnormal findings on diagnostic imaging of heart and coronary circulation: Secondary | ICD-10-CM | POA: Diagnosis not present

## 2022-10-16 DIAGNOSIS — E7800 Pure hypercholesterolemia, unspecified: Secondary | ICD-10-CM | POA: Diagnosis not present

## 2022-10-16 DIAGNOSIS — I10 Essential (primary) hypertension: Secondary | ICD-10-CM

## 2022-10-16 NOTE — Patient Instructions (Signed)
Medication Instructions:  No Changes *If you need a refill on your cardiac medications before your next appointment, please call your pharmacy*   Lab Work: No Labs If you have labs (blood work) drawn today and your tests are completely normal, you will receive your results only by: MyChart Message (if you have MyChart) OR A paper copy in the mail If you have any lab test that is abnormal or we need to change your treatment, we will call you to review the results.   Testing/Procedures: No Testing   Follow-Up: At [facility], you and your health needs are our priority.  As part of our continuing mission to provide you with exceptional heart care, we have created designated Provider Care Teams.  These Care Teams include your primary Cardiologist (physician) and Advanced Practice Providers (APPs -  Physician Assistants and Nurse Practitioners) who all work together to provide you with the care you need, when you need it.  We recommend signing up for the patient portal called "MyChart".  Sign up information is provided on this After Visit Summary.  MyChart is used to connect with patients for Virtual Visits (Telemedicine).  Patients are able to view lab/test results, encounter notes, upcoming appointments, etc.  Non-urgent messages can be sent to your provider as well.   To learn more about what you can do with MyChart, go to [email or web address].    Your next appointment:   1 year(s)  Provider:   [name], MD

## 2022-11-22 ENCOUNTER — Other Ambulatory Visit: Payer: Self-pay | Admitting: Cardiology

## 2022-11-27 ENCOUNTER — Telehealth: Payer: Self-pay | Admitting: Internal Medicine

## 2022-11-27 NOTE — Telephone Encounter (Signed)
She is not really due for labs and I see no upcoming visits with PCP so this would not be typical.

## 2022-11-27 NOTE — Telephone Encounter (Signed)
Patient is requesting lab work to be done on 12/07/2022 when she sees [facility] for her diabetes. Can you put in the order for her?

## 2022-11-30 ENCOUNTER — Encounter: Payer: Self-pay | Admitting: Gastroenterology

## 2022-11-30 ENCOUNTER — Ambulatory Visit: Payer: Medicare PPO | Admitting: Gastroenterology

## 2022-11-30 VITALS — BP 144/60 | HR 84 | Ht 60.0 in | Wt 138.5 lb

## 2022-11-30 DIAGNOSIS — K5900 Constipation, unspecified: Secondary | ICD-10-CM

## 2022-11-30 DIAGNOSIS — K219 Gastro-esophageal reflux disease without esophagitis: Secondary | ICD-10-CM | POA: Diagnosis not present

## 2022-11-30 DIAGNOSIS — Z1211 Encounter for screening for malignant neoplasm of colon: Secondary | ICD-10-CM | POA: Diagnosis not present

## 2022-11-30 DIAGNOSIS — R142 Eructation: Secondary | ICD-10-CM | POA: Diagnosis not present

## 2022-11-30 MED ORDER — OMEPRAZOLE 20 MG PO CPDR: 20.0000 mg | DELAYED_RELEASE_CAPSULE | Freq: Every day | ORAL | 6 refills | Status: DC

## 2022-11-30 NOTE — Patient Instructions (Addendum)
Your provider has ordered Cologuard testing as an option for colon cancer screening. This is performed by [employer] and may be out of network with your insurance. PRIOR to completing the test, it is YOUR responsibility to contact your insurance about covered benefits for this test. Your out of pocket expense could be anywhere from $0.00 to $649.00.   When you call to check coverage with your insurer, please provide the following information:   -The ONLY provider of Cologuard is Opt[facility], dispensing  - CPT code for Cologuard is [phone].  [facility] NPI # [id number]  -[employer] Tax ID # [id number]   We have already sent your demographic and insurance information to [employer] (phone number [phone]) and they should contact you within the next week regarding your test. If you have not heard from them within the next week, please call our office at [phone].  FOLLOW UP AS NEEDED. _______________________________________________________  If your blood pressure at your visit was 140/90 or greater, please contact your primary care physician to follow up on this.  _______________________________________________________  If you are age 74 or older, your body mass index should be between 23-30. Your Body mass index is 27.05 kg/m. If this is out of the aforementioned range listed, please consider follow up with your Primary Care Provider.  If you are age 50 or younger, your body mass index should be between 19-25. Your Body mass index is 27.05 kg/m. If this is out of the aformentioned range listed, please consider follow up with your Primary Care Provider.   ________________________________________________________  The [facility] providers would like to encourage you to use [facility] to communicate with providers for non-urgent requests or questions.  Due to long hold times on the telephone, sending your provider a message by [facility] may be a  faster and more efficient way to get a response.  Please allow 48 business hours for a response.  Please remember that this is for non-urgent requests.  _______________________________________________________ It was a pleasure to see you today!  Thank you for trusting me with your gastrointestinal care!

## 2022-11-30 NOTE — Progress Notes (Signed)
Chief Complaint: Cologuard recall Primary GI MD: Dr. Adela Lank  HPI: 72 year old female history of CAD, diabetes, hypertension, anemia, presents for evaluation of cologuard recall  Last seen in 2022 by Dr. Adela Lank. At that time she was having GERD symptoms. She was put on omeprazole 20mg  once daily. EGD was offered, but patient politely declined. H. Pylori IgG testing was negative.  Recently seen by cardiology (Dr. Okey Dupre) 10/16/2022.  At that time she reported near syncope 2-3 times a month with associated fluttering in her chest and left arm pain.  Cardiology planned ZIO monitor for 2 weeks and repeated coronary CTA.  Patient chose not to wear ZIO monitor for and coronary CTA showed total calcium score of 111.  She is on statin therapy.  Lab work 08/2022 -Normal liver - GFR 47 - Hgb 12.0  Patient states overall she is doing well. She continues to have GERD with symptoms of heartburn and increased eructation. She is currently not on any antacid therapy, though notes omeprazole helped last time she was here. Denies nocturnal symptoms. Denies weight loss. Denies nausea/vomiting. She remains consistent on not wanting an EGD.  She reports some intermittent constipation. States if she does not have her diet full of fruit and other fiber she will become constipated and have to use suppositories. Denies melena/hematochezia.  Not interested in colonoscopy.  PREVIOUS GI WORKUP   Cologuard July 2021 - negative   Colonoscopy 2008 with Dr. Randa Evens for screening - Diverticulosis - Internal moderate hemorrhoids - Normal - Repeat 10 years  Past Medical History:  Diagnosis Date   Anemia    CAD (coronary artery disease)    Diabetes mellitus    High cholesterol    Hypertension     Past Surgical History:  Procedure Laterality Date   CATARACT EXTRACTION, BILATERAL Bilateral 03/22/2018   CESAREAN SECTION     FOOT SURGERY Right    Bone Spur removal   TONSILLECTOMY     WISDOM TOOTH  EXTRACTION      Current Outpatient Medications  Medication Sig Dispense Refill   amLODipine (NORVASC) 5 MG tablet TAKE 1 TAB BY MOUTH DAILY**SCHEDULE OVERDUE APPT WITH DR.CAMNITZ FOR MORE REFILLS 90 tablet 1   atorvastatin (LIPITOR) 20 MG tablet Take 1 tablet (20 mg total) by mouth daily. (Patient taking differently: Take 10 mg by mouth daily. Take 1/2 Tablet Daily) 90 tablet 3   Calcium Carb-Cholecalciferol (CALCIUM 500/D) 500-400 MG-UNIT CHEW Chew 1 tablet by mouth daily.     DENTA 5000 PLUS 1.1 % CREA dental cream Place 1 Application onto teeth at bedtime. (Patient not taking: Reported on 10/16/2022)     glucose blood (ONETOUCH VERIO) test strip 1 each by Other route 2 (two) times a day. 200 each 2   hydrALAZINE (APRESOLINE) 50 MG tablet Take 1 tablet (50 mg total) by mouth in the morning and at bedtime. (Patient taking differently: Take 50 mg by mouth in the morning and at bedtime. Take 1 Tablet Twice Daily.) 180 tablet 3   Lancets (ONETOUCH ULTRASOFT) lancets Use as directed to test blood sugar once daily E11.9 100 each 12   losartan (COZAAR) 100 MG tablet TAKE 1 TABLET BY MOUTH EVERY DAY 90 tablet 3   metoprolol tartrate (LOPRESSOR) 25 MG tablet Take 1 tablet (25 mg total) by mouth once for 1 dose. Take 90 minutes to 2 hours prior to Scan 1 tablet 0   No current facility-administered medications for this visit.    Allergies as of 11/30/2022 - Review Complete  10/16/2022  Allergen Reaction Noted   Codeine Nausea Only 02/10/2013    Family History  Problem Relation Age of Onset   Diabetes Mother    Hypertension Mother    Cancer Mother 35       Cervical   Hypertension Father    Diabetes Maternal Grandmother    Hypertension Sister    Diabetes Sister    Hypertension Brother    Heart disease Neg Hx    Stroke Neg Hx     Social History   Socioeconomic History   Marital status: Single    Spouse name: Not on file   Number of children: Not on file   Years of education: Not on file    Highest education level: Not on file  Occupational History   Occupation: retired from [employer]  Tobacco Use   Smoking status: Never   Smokeless tobacco: Never  Vaping Use   Vaping status: Never Used  Substance and Sexual Activity   Alcohol use: No   Drug use: No   Sexual activity: Not on file  Other Topics Concern   Not on file  Social History Narrative   Not on file   Social Determinants of Health   Financial Resource Strain: Low Risk  (02/03/2022)   Overall Financial Resource Strain (CARDIA)    Difficulty of Paying Living Expenses: Not hard at all  Food Insecurity: No Food Insecurity (02/03/2022)   Hunger Vital Sign    Worried About Running Out of Food in the Last Year: Never true    Ran Out of Food in the Last Year: Never true  Transportation Needs: No Transportation Needs (02/03/2022)   PRAPARE - Administrator, Civil Service (Medical): No    Lack of Transportation (Non-Medical): No  Physical Activity: Insufficiently Active (02/03/2022)   Exercise Vital Sign    Days of Exercise per Week: 2 days    Minutes of Exercise per Session: 60 min  Stress: No Stress Concern Present (02/03/2022)   [employer] of Occupational Health - Occupational Stress Questionnaire    Feeling of Stress : Not at all  Social Connections: Moderately Integrated (02/03/2022)   Social Connection and Isolation Panel [NHANES]    Frequency of Communication with Friends and Family: More than three times a week    Frequency of Social Gatherings with Friends and Family: More than three times a week    Attends Religious Services: More than 4 times per year    Active Member of [employer] or Organizations: Yes    Attends Banker Meetings: 1 to 4 times per year    Marital Status: Never married  Intimate Partner Violence: Not At Risk (02/03/2022)   Humiliation, Afraid, Rape, and Kick questionnaire    Fear of Current or Ex-Partner: No    Emotionally Abused: No    Physically Abused: No     Sexually Abused: No    Review of Systems:    Constitutional: No weight loss, fever, chills, weakness or fatigue HEENT: Eyes: No change in vision               Ears, Nose, Throat:  No change in hearing or congestion Skin: No rash or itching Cardiovascular: No chest pain, chest pressure or palpitations   Respiratory: No SOB or cough Gastrointestinal: See HPI and otherwise negative Genitourinary: No dysuria or change in urinary frequency Neurological: No headache, dizziness or syncope Musculoskeletal: No new muscle or joint pain Hematologic: No bleeding or bruising Psychiatric: No history of  depression or anxiety    Physical Exam:  Vital signs: There were no vitals taken for this visit.  Constitutional: NAD, Well developed, Well nourished, alert and cooperative. Appears much younger than stated age. Head:  Normocephalic and atraumatic. Eyes:   PEERL, EOMI. No icterus. Conjunctiva pink. Respiratory: Respirations even and unlabored. Lungs clear to auscultation bilaterally.   No wheezes, crackles, or rhonchi.  Cardiovascular:  Regular rate and rhythm. No peripheral edema, cyanosis or pallor.  Gastrointestinal:  Soft, nondistended, nontender. No rebound or guarding. Normal bowel sounds. No appreciable masses or hepatomegaly. Rectal:  Not performed.  Msk:  Symmetrical without gross deformities. Without edema, no deformity or joint abnormality.  Neurologic:  Alert and  oriented x4;  grossly normal neurologically.  Skin:   Dry and intact without significant lesions or rashes. Psychiatric: Oriented to person, place and time. Demonstrates good judgement and reason without abnormal affect or behaviors.   RELEVANT LABS AND IMAGING: CBC    Component Value Date/Time   WBC 6.7 08/29/2022 0951   RBC 4.33 08/29/2022 0951   HGB 12.0 08/29/2022 0951   HCT 36.5 08/29/2022 0951   PLT 310.0 08/29/2022 0951   MCV 84.4 08/29/2022 0951   MCH 27.7 09/30/2021 1413   MCHC 32.9 08/29/2022 0951    RDW 13.1 08/29/2022 0951   LYMPHSABS 1.3 09/30/2021 1413   MONOABS 0.4 09/30/2021 1413   EOSABS 0.1 09/30/2021 1413   BASOSABS 0.1 09/30/2021 1413    CMP     Component Value Date/Time   NA 138 09/07/2022 1102   K 4.8 09/07/2022 1102   CL 101 09/07/2022 1102   CO2 23 09/07/2022 1102   GLUCOSE 104 (H) 09/07/2022 1102   GLUCOSE 101 (H) 08/29/2022 0951   BUN 12 09/07/2022 1102   CREATININE 1.22 (H) 09/07/2022 1102   CALCIUM 10.8 (H) 09/07/2022 1102   PROT 7.3 08/29/2022 0951   ALBUMIN 4.2 08/29/2022 0951   AST 19 08/29/2022 0951   ALT 11 08/29/2022 0951   ALKPHOS 70 08/29/2022 0951   BILITOT 0.5 08/29/2022 0951   GFRNONAA 51 (L) 09/30/2021 1413   GFRAA 50 (L) 05/06/2020 1031     Assessment/Plan:   Special screening for malignant neoplasms, colon Denies colonoscopy. Colonoscopy in 2008 was normal. Last cologuard 2021 was negative - cologuard  Gastroesophageal reflux disease, unspecified whether esophagitis present Eructation Persistent chronic GERD symptoms characterized by heartburn and eructation. Not interested in EGD. No NSAIDs - omeprazole 20mg  daily for 60 days - if rebound symptoms when stopping omeprazole would recommend staying on it long term on a scheduled basis or even every other day to prevent symptoms.  - educated patient on lifestyle modifications and provided patient education handouts. - offered EGD. Patient politely declined.  Constipation, unspecified constipation type Intermittent, mainly when not getting enough dietary fiber. No red flag symptoms. Declines colonoscopy - recommend trial of benefiber daily. If not enough, can add in miralax prn. - increase water, increase exercise - follow up if symptoms worsen or not well controlled  [facility] 11/30/2022, 12:58 PM  Cc: Myrlene Broker, *

## 2022-11-30 NOTE — Progress Notes (Signed)
Agree with assessment and plan as outlined.  

## 2022-12-07 ENCOUNTER — Ambulatory Visit (INDEPENDENT_AMBULATORY_CARE_PROVIDER_SITE_OTHER): Payer: Medicare PPO

## 2022-12-07 VITALS — BP 132/58 | HR 71 | Ht 61.5 in | Wt 141.0 lb

## 2022-12-07 DIAGNOSIS — Z0000 Encounter for general adult medical examination without abnormal findings: Secondary | ICD-10-CM

## 2022-12-07 NOTE — Patient Instructions (Signed)
Ms. Vicki Perry , Thank you for taking time to come for your Medicare Wellness Visit. I appreciate your ongoing commitment to your health goals. Please review the following plan we discussed and let me know if I can assist you in the future.   Referrals/Orders/Follow-Ups/Clinician Recommendations: Remember to schedule a nurse visit to get your Flu vaccine soon.  It was very good to meet you.  Keep up the good work.  This is a list of the screening recommended for you and due dates:  Health Maintenance  Topic Date Due   Cologuard (Stool DNA test)  10/08/2022   Flu Shot  10/19/2022   COVID-19 Vaccine (4 - 2023-24 season) 12/23/2022*   Complete foot exam   02/22/2023   Hemoglobin A1C  02/28/2023   Eye exam for diabetics  05/12/2023   Yearly kidney health urinalysis for diabetes  08/29/2023   Yearly kidney function blood test for diabetes  09/07/2023   DTaP/Tdap/Td vaccine (2 - Td or Tdap) 09/11/2023   Medicare Annual Wellness Visit  12/07/2023   Mammogram  04/27/2024   Pneumonia Vaccine  Completed   DEXA scan (bone density measurement)  Completed   Hepatitis C Screening  Completed   HPV Vaccine  Aged Out   Colon Cancer Screening  Discontinued   Zoster (Shingles) Vaccine  Discontinued  *Topic was postponed. The date shown is not the original due date.    Advanced directives: (Provided) Advance directive discussed with you today. I have provided a copy for you to complete at home and have notarized. Once this is complete, please bring a copy in to our office so we can scan it into your chart.   Next Medicare Annual Wellness Visit scheduled for next year: No

## 2022-12-07 NOTE — Progress Notes (Signed)
Subjective:   Vicki Perry is a 72 y.o. female who presents for Medicare Annual (Subsequent) preventive examination.  Visit Complete: In person .  Cardiac Risk Factors include: advanced age (>55men, >33 women);hypertension;dyslipidemia;Other (see comment);diabetes mellitus, Risk factor comments: CKD     Objective:    Today's Vitals   12/07/22 0904  BP: (!) 132/58  Pulse: 71  SpO2: 99%  Weight: 141 lb (64 kg)  Height: 5' 1.5" (1.562 m)   Body mass index is 26.21 kg/m.     12/07/2022    9:12 AM 02/03/2022   11:43 AM 09/30/2021    1:38 PM 01/31/2021   10:19 AM 09/19/2019   12:26 PM  Advanced Directives  Does Patient Have a Medical Advance Directive? No No No No No  Would patient like information on creating a medical advance directive? Yes (MAU/Ambulatory/Procedural Areas - Information given) No - Patient declined  Yes (MAU/Ambulatory/Procedural Areas - Information given) Yes (ED - Information included in AVS)    Current Medications (verified) Outpatient Encounter Medications as of 12/07/2022  Medication Sig   amLODipine (NORVASC) 5 MG tablet TAKE 1 TAB BY MOUTH DAILY**SCHEDULE OVERDUE APPT WITH DR.CAMNITZ FOR MORE REFILLS   atorvastatin (LIPITOR) 20 MG tablet Take 1 tablet (20 mg total) by mouth daily. (Patient taking differently: Take 10 mg by mouth daily. Take 1/2 Tablet Daily)   Calcium Carb-Cholecalciferol (CALCIUM 500/D) 500-400 MG-UNIT CHEW Chew 1 tablet by mouth daily.   DENTA 5000 PLUS 1.1 % CREA dental cream Place 1 Application onto teeth at bedtime.   glucose blood (ONETOUCH VERIO) test strip 1 each by Other route 2 (two) times a day.   hydrALAZINE (APRESOLINE) 50 MG tablet Take 1 tablet (50 mg total) by mouth in the morning and at bedtime. (Patient taking differently: Take 50 mg by mouth in the morning and at bedtime. Take 1 Tablet Twice Daily.)   Lancets (ONETOUCH ULTRASOFT) lancets Use as directed to test blood sugar once daily E11.9   losartan (COZAAR) 100 MG  tablet TAKE 1 TABLET BY MOUTH EVERY DAY   omeprazole (PRILOSEC) 20 MG capsule Take 1 capsule (20 mg total) by mouth daily. (Patient not taking: Reported on 12/07/2022)   No facility-administered encounter medications on file as of 12/07/2022.    Allergies (verified) Codeine   History: Past Medical History:  Diagnosis Date   Anemia    CAD (coronary artery disease)    Diabetes mellitus    High cholesterol    Hypertension    Past Surgical History:  Procedure Laterality Date   CATARACT EXTRACTION, BILATERAL Bilateral 03/22/2018   CESAREAN SECTION     FOOT SURGERY Right    Bone Spur removal   TONSILLECTOMY     WISDOM TOOTH EXTRACTION     Family History  Problem Relation Age of Onset   Diabetes Mother    Hypertension Mother    Cancer Mother 70       Cervical   Hypertension Father    Hypertension Sister    Diabetes Sister    Hypertension Brother    Kidney failure Brother    Stroke Brother    Diabetes Maternal Grandmother    Heart disease Neg Hx    Social History   Socioeconomic History   Marital status: Single    Spouse name: Not on file   Number of children: 1   Years of education: Not on file   Highest education level: Not on file  Occupational History   Occupation: retired from [employer]  Tobacco Use   Smoking status: Never   Smokeless tobacco: Never  Vaping Use   Vaping status: Never Used  Substance and Sexual Activity   Alcohol use: No   Drug use: No   Sexual activity: Not on file  Other Topics Concern   Not on file  Social History Narrative   Her brother lives with her.   Social Determinants of Health   Financial Resource Strain: Low Risk  (12/07/2022)   Overall Financial Resource Strain (CARDIA)    Difficulty of Paying Living Expenses: Not very hard  Food Insecurity: No Food Insecurity (12/07/2022)   Hunger Vital Sign    Worried About Running Out of Food in the Last Year: Never true    Ran Out of Food in the Last Year: Never true  Transportation Needs:  No Transportation Needs (12/07/2022)   PRAPARE - Administrator, Civil Service (Medical): No    Lack of Transportation (Non-Medical): No  Physical Activity: Insufficiently Active (12/07/2022)   Exercise Vital Sign    Days of Exercise per Week: 2 days    Minutes of Exercise per Session: 60 min  Stress: No Stress Concern Present (12/07/2022)   [employer] of Occupational Health - Occupational Stress Questionnaire    Feeling of Stress : Only a little  Social Connections: Moderately Isolated (12/07/2022)   Social Connection and Isolation Panel [NHANES]    Frequency of Communication with Friends and Family: More than three times a week    Frequency of Social Gatherings with Friends and Family: More than three times a week    Attends Religious Services: More than 4 times per year    Active Member of [employer] or Organizations: No    Attends Engineer, structural: Never    Marital Status: Never married    Tobacco Counseling Counseling given: Not Answered   Clinical Intake:  Pre-visit preparation completed: Yes  Pain : No/denies pain     BMI - recorded: 26.21 Nutritional Status: BMI 25 -29 Overweight Nutritional Risks: None Diabetes: Yes CBG done?: No Did pt. bring in CBG monitor from home?: No  How often do you need to have someone help you when you read instructions, pamphlets, or other written materials from your doctor or pharmacy?: 1 - Never  Interpreter Needed?: No  Information entered by :: Emiliana Blaize, RMA   Activities of Daily Living    12/07/2022    9:05 AM 02/03/2022   11:44 AM  In your present state of health, do you have any difficulty performing the following activities:  Hearing? 0   Vision? 0   Difficulty concentrating or making decisions? 0   Walking or climbing stairs? 0   Dressing or bathing? 0   Doing errands, shopping? 0   Preparing Food and eating ? N N  Using the Toilet? N N  In the past six months, have you accidently  leaked urine? N N  Do you have problems with loss of bowel control? N N  Managing your Medications? N N  Managing your Finances? N N  Housekeeping or managing your Housekeeping? N N    Patient Care Team: Myrlene Broker, MD as PCP - General (Internal Medicine) [name], MD as PCP - Cardiology (Cardiology)  Indicate any recent Medical Services you may have received from other than [facility] providers in the past year (date may be approximate).     Assessment:   This is a routine wellness examination for Taralynn.  Hearing/Vision screen  Hearing Screening - Comments:: Denies hearing difficulties   Vision Screening - Comments:: Wears eyeglasses when reading/had cataract surgery   Goals Addressed               This Visit's Progress     Patient Stated (pt-stated)   On track     My goal is to lose 10 pounds by watching my salt and sugar intake.  I will continue to be physically active and independent.      Depression Screen    12/07/2022    9:19 AM 02/21/2022    9:16 AM 02/03/2022   11:41 AM 02/03/2022   11:38 AM 10/03/2021   10:54 AM 08/01/2021    8:42 AM 01/31/2021   11:07 AM  PHQ 2/9 Scores  PHQ - 2 Score 0 0 0 0 0 0 0  PHQ- 9 Score 1 0   0 0     Fall Risk    12/07/2022    9:13 AM 08/29/2022    8:46 AM 02/21/2022    9:15 AM 02/03/2022   11:38 AM 10/03/2021   10:54 AM  Fall Risk   Falls in the past year? 0 0 0 0 0  Number falls in past yr: 0 0 0 0 0  Injury with Fall? 0 0 0 0 0  Risk for fall due to : No Fall Risks   No Fall Risks   Follow up Falls prevention discussed;Falls evaluation completed Falls evaluation completed Falls evaluation completed Falls evaluation completed     MEDICARE RISK AT HOME: Medicare Risk at Home Any stairs in or around the home?: Yes If so, are there any without handrails?: Yes Home free of loose throw rugs in walkways, pet beds, electrical cords, etc?: Yes Adequate lighting in your home to reduce risk of falls?:  Yes Life alert?: No Use of a cane, walker or w/c?: No Grab bars in the bathroom?: No Shower chair or bench in shower?: No Elevated toilet seat or a handicapped toilet?: No  TIMED UP AND GO:  Was the test performed?  Yes  Length of time to ambulate 10 feet: 10 sec Gait steady and fast without use of assistive device    Cognitive Function:        12/07/2022    9:15 AM 02/03/2022   11:42 AM  6CIT Screen  What Year? 0 points 0 points  What month? 0 points 0 points  What time? 0 points 0 points  Count back from 20 0 points 0 points  Months in reverse 0 points 0 points  Repeat phrase 4 points 0 points  Total Score 4 points 0 points    Immunizations Immunization History  Administered Date(s) Administered   Fluad Quad(high Dose 65+) 01/31/2021, 02/03/2022   Influenza,inj,Quad PF,6+ Mos 02/10/2013, 02/26/2014, 03/02/2015, 03/16/2016, 05/03/2017, 01/17/2018, 03/17/2019   PFIZER(Purple Top)SARS-COV-2 Vaccination 06/12/2019, 07/07/2019, 03/04/2020   Pneumococcal Conjugate-13 09/15/2014   Pneumococcal Polysaccharide-23 07/12/2012, 05/03/2017   Tdap 09/10/2013    TDAP status: Up to date  Flu Vaccine status: Due, Education has been provided regarding the importance of this vaccine. Advised may receive this vaccine at local pharmacy or Health Dept. Aware to provide a copy of the vaccination record if obtained from local pharmacy or Health Dept. Verbalized acceptance and understanding.  Pneumococcal vaccine status: Up to date  Covid-19 vaccine status: Completed vaccines  Qualifies for Shingles Vaccine? Yes   Zostavax completed  Declines   Shingrix Completed?: No.    Education has been provided regarding the  importance of this vaccine. Patient has been advised to call insurance company to determine out of pocket expense if they have not yet received this vaccine. Advised may also receive vaccine at local pharmacy or Health Dept. Verbalized acceptance and understanding.  Screening  Tests Health Maintenance  Topic Date Due   Fecal DNA (Cologuard)  10/08/2022   INFLUENZA VACCINE  10/19/2022   COVID-19 Vaccine (4 - 2023-24 season) 12/23/2022 (Originally 11/19/2022)   FOOT EXAM  02/22/2023   HEMOGLOBIN A1C  02/28/2023   OPHTHALMOLOGY EXAM  05/12/2023   Diabetic kidney evaluation - Urine ACR  08/29/2023   Diabetic kidney evaluation - eGFR measurement  09/07/2023   DTaP/Tdap/Td (2 - Td or Tdap) 09/11/2023   Medicare Annual Wellness (AWV)  12/07/2023   MAMMOGRAM  04/27/2024   Pneumonia Vaccine 73+ Years old  Completed   DEXA SCAN  Completed   Hepatitis C Screening  Completed   HPV VACCINES  Aged Out   Colonoscopy  Discontinued   Zoster Vaccines- Shingrix  Discontinued    Health Maintenance  Health Maintenance Due  Topic Date Due   Fecal DNA (Cologuard)  10/08/2022   INFLUENZA VACCINE  10/19/2022    Colorectal cancer screening: Type of screening: Cologuard. Completed 10/08/2019. Repeat every 3 years  Mammogram status: Completed 04/27/2022. Repeat every year  Bone Density status: Completed 04/14/2021. Results reflect: Bone density results: NORMAL. Repeat every 2 years.  Lung Cancer Screening: (Low Dose CT Chest recommended if Age 25-80 years, 20 pack-year currently smoking OR have quit w/in 15years.) does not qualify.   Lung Cancer Screening Referral: N/A  Additional Screening:  Hepatitis C Screening: does qualify; Completed 03/02/2015  Vision Screening: Recommended annual ophthalmology exams for early detection of glaucoma and other disorders of the eye. Is the patient up to date with their annual eye exam?  Yes  Who is the provider or what is the name of the office in which the patient attends annual eye exams? [facility] If pt is not established with a provider, would they like to be referred to a provider to establish care? No .   Dental Screening: Recommended annual dental exams for proper oral hygiene  Diabetic Foot Exam: Diabetic Foot Exam:  Completed 0 08/29/2022  Community Resource Referral / Chronic Care Management: CRR required this visit?  No   CCM required this visit?  No     Plan:     I have personally reviewed and noted the following in the patient's chart:   Medical and social history Use of alcohol, tobacco or illicit drugs  Current medications and supplements including opioid prescriptions. Patient is not currently taking opioid prescriptions. Functional ability and status Nutritional status Physical activity Advanced directives List of other physicians Hospitalizations, surgeries, and ER visits in previous 12 months Vitals Screenings to include cognitive, depression, and falls Referrals and appointments  In addition, I have reviewed and discussed with patient certain preventive protocols, quality metrics, and best practice recommendations. A written personalized care plan for preventive services as well as general preventive health recommendations were provided to patient.     Regginald Pask L Taelon Bendorf, CMA   12/07/2022   After Visit Summary: (MyChart) Due to this being a telephonic visit, the after visit summary with patients personalized plan was offered to patient via MyChart   Nurse Notes: Patient is due for a Flu vaccine, which patient stated that she will schedule a nurse visit soon to have that done.  She also has a Engineer, manufacturing ordered  with GI and will be expecting it to come to her home any day now.  Patient is up to date on all other Health Maintenance.  She has no other concerns to address today.

## 2022-12-31 ENCOUNTER — Other Ambulatory Visit: Payer: Self-pay | Admitting: Internal Medicine

## 2023-01-07 LAB — FECAL OCCULT BLOOD, IMMUNOCHEMICAL: IFOBT: NEGATIVE

## 2023-01-24 ENCOUNTER — Encounter: Payer: Self-pay | Admitting: Internal Medicine

## 2023-01-31 DIAGNOSIS — N1831 Chronic kidney disease, stage 3a: Secondary | ICD-10-CM | POA: Diagnosis not present

## 2023-01-31 DIAGNOSIS — Z809 Family history of malignant neoplasm, unspecified: Secondary | ICD-10-CM | POA: Diagnosis not present

## 2023-01-31 DIAGNOSIS — I129 Hypertensive chronic kidney disease with stage 1 through stage 4 chronic kidney disease, or unspecified chronic kidney disease: Secondary | ICD-10-CM | POA: Diagnosis not present

## 2023-01-31 DIAGNOSIS — Z823 Family history of stroke: Secondary | ICD-10-CM | POA: Diagnosis not present

## 2023-01-31 DIAGNOSIS — K219 Gastro-esophageal reflux disease without esophagitis: Secondary | ICD-10-CM | POA: Diagnosis not present

## 2023-01-31 DIAGNOSIS — E785 Hyperlipidemia, unspecified: Secondary | ICD-10-CM | POA: Diagnosis not present

## 2023-01-31 DIAGNOSIS — Z8249 Family history of ischemic heart disease and other diseases of the circulatory system: Secondary | ICD-10-CM | POA: Diagnosis not present

## 2023-01-31 DIAGNOSIS — I2510 Atherosclerotic heart disease of native coronary artery without angina pectoris: Secondary | ICD-10-CM | POA: Diagnosis not present

## 2023-01-31 DIAGNOSIS — M1990 Unspecified osteoarthritis, unspecified site: Secondary | ICD-10-CM | POA: Diagnosis not present

## 2023-03-26 ENCOUNTER — Other Ambulatory Visit: Payer: Self-pay | Admitting: Internal Medicine

## 2023-03-26 DIAGNOSIS — Z1231 Encounter for screening mammogram for malignant neoplasm of breast: Secondary | ICD-10-CM

## 2023-04-27 ENCOUNTER — Ambulatory Visit: Payer: Medicare PPO

## 2023-05-01 DIAGNOSIS — E119 Type 2 diabetes mellitus without complications: Secondary | ICD-10-CM | POA: Diagnosis not present

## 2023-05-08 ENCOUNTER — Ambulatory Visit
Admission: RE | Admit: 2023-05-08 | Discharge: 2023-05-08 | Disposition: A | Discharge status: Home / Self Care | Payer: Medicare PPO | Source: Ambulatory Visit | Attending: Internal Medicine | Admitting: Internal Medicine

## 2023-05-08 DIAGNOSIS — Z1231 Encounter for screening mammogram for malignant neoplasm of breast: Secondary | ICD-10-CM | POA: Diagnosis not present

## 2023-05-11 ENCOUNTER — Encounter: Payer: Self-pay | Admitting: Internal Medicine

## 2023-05-11 LAB — HM MAMMOGRAPHY

## 2023-05-15 ENCOUNTER — Ambulatory Visit: Payer: Medicare PPO | Admitting: Internal Medicine

## 2023-05-15 ENCOUNTER — Encounter: Payer: Self-pay | Admitting: Internal Medicine

## 2023-05-15 ENCOUNTER — Other Ambulatory Visit: Payer: Self-pay | Admitting: Internal Medicine

## 2023-05-15 VITALS — BP 140/80 | HR 67 | Temp 97.7°F | Ht 61.5 in | Wt 136.0 lb

## 2023-05-15 DIAGNOSIS — E1169 Type 2 diabetes mellitus with other specified complication: Secondary | ICD-10-CM

## 2023-05-15 DIAGNOSIS — Z0000 Encounter for general adult medical examination without abnormal findings: Secondary | ICD-10-CM | POA: Diagnosis not present

## 2023-05-15 DIAGNOSIS — E785 Hyperlipidemia, unspecified: Secondary | ICD-10-CM | POA: Diagnosis not present

## 2023-05-15 DIAGNOSIS — N183 Chronic kidney disease, stage 3 unspecified: Secondary | ICD-10-CM

## 2023-05-15 DIAGNOSIS — E1122 Type 2 diabetes mellitus with diabetic chronic kidney disease: Secondary | ICD-10-CM

## 2023-05-15 DIAGNOSIS — Z0001 Encounter for general adult medical examination with abnormal findings: Secondary | ICD-10-CM

## 2023-05-15 DIAGNOSIS — N1831 Chronic kidney disease, stage 3a: Secondary | ICD-10-CM | POA: Diagnosis not present

## 2023-05-15 DIAGNOSIS — K219 Gastro-esophageal reflux disease without esophagitis: Secondary | ICD-10-CM | POA: Diagnosis not present

## 2023-05-15 DIAGNOSIS — I10 Essential (primary) hypertension: Secondary | ICD-10-CM

## 2023-05-15 LAB — CBC
HCT: 37.8 % (ref 36.0–46.0)
Hemoglobin: 12.6 g/dL (ref 12.0–15.0)
MCHC: 33.4 g/dL (ref 30.0–36.0)
MCV: 84.8 fL (ref 78.0–100.0)
Platelets: 302 10*3/uL (ref 150.0–400.0)
RBC: 4.46 Mil/uL (ref 3.87–5.11)
RDW: 12.7 % (ref 11.5–15.5)
WBC: 6.3 10*3/uL (ref 4.0–10.5)

## 2023-05-15 LAB — COMPREHENSIVE METABOLIC PANEL
ALT: 11 U/L (ref 0–35)
AST: 19 U/L (ref 0–37)
Albumin: 4.3 g/dL (ref 3.5–5.2)
Alkaline Phosphatase: 79 U/L (ref 39–117)
BUN: 10 mg/dL (ref 6–23)
CO2: 28 meq/L (ref 19–32)
Calcium: 9.4 mg/dL (ref 8.4–10.5)
Chloride: 101 meq/L (ref 96–112)
Creatinine, Ser: 1.09 mg/dL (ref 0.40–1.20)
GFR: 49.87 mL/min — ABNORMAL LOW (ref >60.00)
Glucose, Bld: 120 mg/dL — ABNORMAL HIGH (ref 70–99)
Potassium: 4.2 meq/L (ref 3.5–5.1)
Sodium: 136 meq/L (ref 135–145)
Total Bilirubin: 0.5 mg/dL (ref 0.2–1.2)
Total Protein: 7.4 g/dL (ref 6.0–8.3)

## 2023-05-15 LAB — LIPID PANEL
Cholesterol: 173 mg/dL (ref 0–200)
HDL: 46.7 mg/dL (ref >39.00)
LDL Cholesterol: 103 mg/dL — ABNORMAL HIGH (ref 0–99)
NonHDL: 126.05
Total CHOL/HDL Ratio: 4
Triglycerides: 116 mg/dL (ref 0.0–149.0)
VLDL: 23.2 mg/dL (ref 0.0–40.0)

## 2023-05-15 LAB — HEMOGLOBIN A1C: Hgb A1c MFr Bld: 6.3 % (ref 4.6–6.5)

## 2023-05-15 LAB — MAGNESIUM: Magnesium: 1.8 mg/dL (ref 1.5–2.5)

## 2023-05-15 MED ORDER — ROSUVASTATIN CALCIUM 5 MG PO TABS: 5.0000 mg | ORAL_TABLET | Freq: Every day | ORAL | 3 refills | Status: DC

## 2023-05-15 NOTE — Patient Instructions (Signed)
 We will check the labs today.

## 2023-05-15 NOTE — Assessment & Plan Note (Signed)
Not on meds currently.

## 2023-05-15 NOTE — Progress Notes (Signed)
   Subjective:   Patient ID: Vicki Perry, female    DOB: [birth year], 73 y.o.   MRN: [id number]  HPI The patient is a 73 YO female coming in for physical as well as new muscle cramps and tingling in feet. She has stopped metformin and needs follow up diabetes. Did stop taking atorvastatin some time ago due to it making her feel nauseous. Did not notice that stopping affects cramps.  PMH, [facility], social history reviewed  Review of Systems  Constitutional: Negative.   HENT: Negative.    Eyes: Negative.   Respiratory:  Positive for shortness of breath. Negative for cough and chest tightness.        Stable from prior  Cardiovascular:  Negative for chest pain, palpitations and leg swelling.  Gastrointestinal:  Negative for abdominal distention, abdominal pain, constipation, diarrhea, nausea and vomiting.  Musculoskeletal:  Positive for myalgias.  Skin: Negative.   Neurological:  Positive for numbness.  Psychiatric/Behavioral: Negative.      Objective:  Physical Exam Constitutional:      Appearance: She is well-developed.  HENT:     Head: Normocephalic and atraumatic.  Cardiovascular:     Rate and Rhythm: Normal rate and regular rhythm.  Pulmonary:     Effort: Pulmonary effort is normal. No respiratory distress.     Breath sounds: Normal breath sounds. No wheezing or rales.  Abdominal:     General: Bowel sounds are normal. There is no distension.     Palpations: Abdomen is soft.     Tenderness: There is no abdominal tenderness. There is no rebound.  Musculoskeletal:        General: Tenderness present.     Cervical back: Normal range of motion.  Skin:    General: Skin is warm and dry.     Comments: Foot exam done  Neurological:     Mental Status: She is alert and oriented to person, place, and time.     Coordination: Coordination normal.     Vitals:   05/15/23 1011 05/15/23 1042  BP: (!) 176/80 (!) 140/80  Pulse: 67   Temp: 97.7 F (36.5 C)   TempSrc: Oral   SpO2: 96%    Weight: 136 lb (61.7 kg)   Height: 5' 1.5" (1.562 m)     Assessment & Plan:

## 2023-05-15 NOTE — Assessment & Plan Note (Addendum)
 BP borderline today with normal readings at home she checks consistently. Taking losartan 100 mg daily and hydralazine 50 mg BID and amlodipine 5 mg daily. Checking CMP and adjust as needed. Checking magnesium given new myalgias.

## 2023-05-15 NOTE — Assessment & Plan Note (Signed)
 Flu shot declines. Pneumonia complete. Shingrix declines. Tetanus up to date. Colon cancer screening not up to date. Mammogram up to date, pap smear aged out and dexa up to date. Counseled about sun safety and mole surveillance. Counseled about the dangers of distracted driving. Given 10 year screening recommendations.

## 2023-05-15 NOTE — Assessment & Plan Note (Signed)
 Not taking meds currently. Previously prescribed atorvastatin. Checking lipid panel and will start crestor if above goal. She is willing to try.

## 2023-05-15 NOTE — Assessment & Plan Note (Signed)
 Checking CMP for stability. Adjust as needed. BP controlled at home borderline today.

## 2023-05-15 NOTE — Assessment & Plan Note (Signed)
 Checking HgA1c, diet controlled currently. Is on ARB and not taking statin currently as she stopped this herself. Checking lipid panel and likely will start crestor.

## 2023-09-07 ENCOUNTER — Other Ambulatory Visit: Payer: Self-pay | Admitting: Internal Medicine

## 2023-09-07 NOTE — Telephone Encounter (Signed)
 Copied from CRM [id number]. Topic: Clinical - Medication Refill >> Sep 07, 2023  4:11 PM Baldo Levan wrote: Medication: amLODipine  (NORVASC ) 5 MG tablet [[id number]]  Has the patient contacted their pharmacy? Yes- The pharmacy told the patient it was denied. (Agent: If no, request that the patient contact the pharmacy for the refill. If patient does not wish to contact the pharmacy document the reason why and proceed with request.) (Agent: If yes, when and what did the pharmacy advise?)  This is the patient's preferred pharmacy:  [id number] [place], [place] - [address] [facility] [facility] [address] [place] [address] Phone: [phone] Fax: [phone]  Is this the correct pharmacy for this prescription? Yes If no, delete pharmacy and type the correct one.   Has the prescription been filled recently? No  Is the patient out of the medication? No- Patient has enough for the weekend  Has the patient been seen for an appointment in the last year OR does the patient have an upcoming appointment? Yes  Can we respond through MyChart? No  Agent: Please be advised that Rx refills may take up to 3 business days. We ask that you follow-up with your pharmacy.

## 2023-09-11 ENCOUNTER — Telehealth: Payer: Self-pay

## 2023-09-11 ENCOUNTER — Other Ambulatory Visit: Payer: Self-pay | Admitting: Internal Medicine

## 2023-09-11 NOTE — Telephone Encounter (Signed)
 Pt requesting refill      Copied from CRM [id number]. Topic: Clinical - Medication Refill >> Sep 07, 2023  4:11 PM Chiquita SQUIBB wrote: Medication: amLODipine  (NORVASC ) 5 MG tablet [[id number]]  Has the patient contacted their pharmacy? Yes- The pharmacy told the patient it was denied. (Agent: If no, request that the patient contact the pharmacy for the refill. If patient does not wish to contact the pharmacy document the reason why and proceed with request.) (Agent: If yes, when and what did the pharmacy advise?)  This is the patient's preferred pharmacy:  [id number] [place], [place] - [address] [place] [address] Phone: [phone] Fax: [phone]  Is this the correct pharmacy for this prescription? Yes If no, delete pharmacy and type the correct one.   Has the prescription been filled recently? No  Is the patient out of the medication? No- Patient has enough for the weekend  Has the patient been seen for an appointment in the last year OR does the patient have an upcoming appointment? Yes  Can we respond through MyChart? No  Agent: Please be advised that Rx refills may take up to 3 business days. We ask that you follow-up with your pharmacy. >> Sep 11, 2023  1:04 PM Henretta I wrote: Patient called back regarding medication refill as pharmacy hasn't received it yet

## 2023-09-11 NOTE — Telephone Encounter (Signed)
 Copied from CRM [id number]. Topic: General - Other >> Sep 11, 2023  1:01 PM Henretta I wrote: Reason for CRM: Patient would like to know if a new monitor for her diabetes can be ordered or will she have to wait until her August appointment

## 2023-09-13 NOTE — Telephone Encounter (Signed)
 Ok to send in.

## 2023-09-14 NOTE — Telephone Encounter (Signed)
 She doesn't need to check sugars routinely is there a reason she wants?

## 2023-09-18 ENCOUNTER — Other Ambulatory Visit: Payer: Self-pay | Admitting: Cardiology

## 2023-09-24 NOTE — Telephone Encounter (Signed)
 Has since been filled by cardiologist

## 2023-10-10 ENCOUNTER — Telehealth: Payer: Self-pay | Admitting: Pharmacist

## 2023-10-10 NOTE — Progress Notes (Signed)
 Pharmacy Quality Measure Review  This patient is appearing on a report for being at risk of failing the adherence measure for cholesterol (statin) medications this calendar year.   Medication: Rosuvastatin  5 mg Last fill date: 2/25/202585 for 90 day supply  Discussed barriers to adherence, which included pt is experiencing nausea when she takes rosuvastatin  therefore she only takes it about 2-3x per week. Even when taking 2-3x per week, she still experiences nausea.   Pt also asked about amlodipine  5 mg - she notes she had been trying to get a refill for amlodipine  however she was not able to get a call back. I noted that cardiology, Dr. Alvan, is the one who prescribed the medication, not our office, therefore we did not get the refill requests. Also informed her that cardio sent a 30 DS refill on 09/18/23. [facility] after it was ready for pick up until 10/02/23. They will go ahead and fill it for her to pick up. Informed pt to discuss with PCP at upcoming visit if she will refill the Rx since she no longer follows with cardio.  Darrelyn Drum, PharmD, BCPS, CPP Clinical Pharmacist Practitioner [facility] [phone]

## 2023-10-15 ENCOUNTER — Other Ambulatory Visit: Payer: Self-pay | Admitting: Internal Medicine

## 2023-11-01 ENCOUNTER — Other Ambulatory Visit: Payer: Self-pay | Admitting: Cardiology

## 2023-11-12 ENCOUNTER — Ambulatory Visit (INDEPENDENT_AMBULATORY_CARE_PROVIDER_SITE_OTHER)

## 2023-11-12 ENCOUNTER — Encounter: Payer: Self-pay | Admitting: Internal Medicine

## 2023-11-12 ENCOUNTER — Ambulatory Visit: Admitting: Internal Medicine

## 2023-11-12 VITALS — BP 144/58 | HR 76 | Temp 97.7°F | Resp 20 | Ht 61.5 in | Wt 137.0 lb

## 2023-11-12 DIAGNOSIS — R0789 Other chest pain: Secondary | ICD-10-CM

## 2023-11-12 DIAGNOSIS — M25512 Pain in left shoulder: Secondary | ICD-10-CM | POA: Diagnosis not present

## 2023-11-12 DIAGNOSIS — E1122 Type 2 diabetes mellitus with diabetic chronic kidney disease: Secondary | ICD-10-CM

## 2023-11-12 DIAGNOSIS — N1831 Chronic kidney disease, stage 3a: Secondary | ICD-10-CM | POA: Diagnosis not present

## 2023-11-12 DIAGNOSIS — K219 Gastro-esophageal reflux disease without esophagitis: Secondary | ICD-10-CM | POA: Diagnosis not present

## 2023-11-12 DIAGNOSIS — G8929 Other chronic pain: Secondary | ICD-10-CM

## 2023-11-12 DIAGNOSIS — I10 Essential (primary) hypertension: Secondary | ICD-10-CM

## 2023-11-12 DIAGNOSIS — M19012 Primary osteoarthritis, left shoulder: Secondary | ICD-10-CM | POA: Diagnosis not present

## 2023-11-12 DIAGNOSIS — M778 Other enthesopathies, not elsewhere classified: Secondary | ICD-10-CM | POA: Diagnosis not present

## 2023-11-12 LAB — COMPREHENSIVE METABOLIC PANEL WITH GFR
ALT: 13 U/L (ref 0–35)
AST: 20 U/L (ref 0–37)
Albumin: 4.4 g/dL (ref 3.5–5.2)
Alkaline Phosphatase: 78 U/L (ref 39–117)
BUN: 11 mg/dL (ref 6–23)
CO2: 26 meq/L (ref 19–32)
Calcium: 9.5 mg/dL (ref 8.4–10.5)
Chloride: 98 meq/L (ref 96–112)
Creatinine, Ser: 1.02 mg/dL (ref 0.40–1.20)
GFR: 53.82 mL/min — ABNORMAL LOW (ref >60.00)
Glucose, Bld: 114 mg/dL — ABNORMAL HIGH (ref 70–99)
Potassium: 4.3 meq/L (ref 3.5–5.1)
Sodium: 132 meq/L — ABNORMAL LOW (ref 135–145)
Total Bilirubin: 0.6 mg/dL (ref 0.2–1.2)
Total Protein: 7.5 g/dL (ref 6.0–8.3)

## 2023-11-12 LAB — CBC
HCT: 37.3 % (ref 36.0–46.0)
Hemoglobin: 12.4 g/dL (ref 12.0–15.0)
MCHC: 33.4 g/dL (ref 30.0–36.0)
MCV: 83.4 fl (ref 78.0–100.0)
Platelets: 344 K/uL (ref 150.0–400.0)
RBC: 4.47 Mil/uL (ref 3.87–5.11)
RDW: 13.2 % (ref 11.5–15.5)
WBC: 7.3 K/uL (ref 4.0–10.5)

## 2023-11-12 LAB — POCT GLYCOSYLATED HEMOGLOBIN (HGB A1C): Hemoglobin A1C: 6.1 % — AB (ref 4.0–5.6)

## 2023-11-12 MED ORDER — BLOOD GLUCOSE MONITORING SUPPL DEVI
1.0000 | Freq: Three times a day (TID) | 0 refills | Status: DC
Start: 2023-11-12 — End: 2023-11-28

## 2023-11-12 MED ORDER — ROSUVASTATIN CALCIUM 5 MG PO TABS: 5.0000 mg | ORAL_TABLET | ORAL | 3 refills | Status: AC

## 2023-11-12 MED ORDER — BLOOD GLUCOSE TEST VI STRP: 1.0000 | ORAL_STRIP | Freq: Three times a day (TID) | 0 refills | Status: DC

## 2023-11-12 MED ORDER — AMLODIPINE BESYLATE 5 MG PO TABS: 5.0000 mg | ORAL_TABLET | Freq: Every day | ORAL | 3 refills | Status: AC

## 2023-11-12 MED ORDER — LANCETS MISC. MISC
1.0000 | Freq: Three times a day (TID) | 0 refills | Status: DC
Start: 2023-11-12 — End: 2023-11-28

## 2023-11-12 MED ORDER — LANCET DEVICE MISC
1.0000 | Freq: Three times a day (TID) | 0 refills | Status: DC
Start: 2023-11-12 — End: 2023-11-28

## 2023-11-12 NOTE — Patient Instructions (Addendum)
 We will check the labs today. The sugars are doing well at 6.1 today.  We will get the x-ray of the shoulder. Your EKG is normal today.

## 2023-11-12 NOTE — Assessment & Plan Note (Addendum)
 Last GFR stable at 47. BP at goal and on ARB. Continue to monitor checking CMP today.

## 2023-11-12 NOTE — Assessment & Plan Note (Signed)
 suspect related to shoulder joint. EKG done to rule out changes which is stable. Will assess shoulder pain and if persistent may need further testing.

## 2023-11-12 NOTE — Progress Notes (Signed)
   Subjective:   Patient ID: Vicki Perry, female    DOB: [birth year], 73 y.o.   MRN: [id number]  Discussed the use of AI scribe software for clinical note transcription with the patient, who gave verbal consent to proceed.  History of Present Illness Vicki Perry is a 73 year old female with hypertension who presents with left shoulder pain.  She experiences aching and sharp pain in her left shoulder, which sometimes radiates to her breast and down the middle. The pain is severe, almost to the point of tears, and occurs periodically, especially during exercise, such as yoga chair exercises. She has experienced this pain about three times in the past two weeks.  Her blood pressure fluctuates, with readings ranging from 111/55 to 157/60 with average 120s/60s. She is currently taking amlodipine , but there was an issue with refilling the prescription, which she is addressing. She monitors her blood pressure regularly and notes that it fluctuates.  She experiences periodic stomach problems, which she attributes to indigestion. She takes Tums for relief and avoids prednisone  as it causes nausea. She also takes a cholesterol medication a couple of times a week, as it causes nausea, and plans her intake around her schedule to manage side effects.  Her hemoglobin A1c is currently 6.1. She is conscious of her kidney and liver function due to her brother's passing and regularly monitors these parameters. No new breathing troubles or getting out of breath easily.  Review of Systems l Objective:  Physical Exam  Vitals:   11/12/23 0923  BP: (!) 144/58  Pulse: 76  Resp: 20  Temp: 97.7 F (36.5 C)  Weight: 137 lb (62.1 kg)  Height: 5' 1.5 (1.562 m)   EKG: Rate 61, axis normal, interval normal, rare PVC, sinus, no st or t wave changes, no significant change compared to prior 2024  Assessment and Plan Assessment & Plan Left shoulder pain and associated chest pain   Intermittent sharp pain in  the left shoulder radiates to the chest during exercise, likely musculoskeletal in origin. Cardiac origin is less likely. Order a left shoulder X-ray and perform an EKG to rule out cardiac involvement.  Atypical chest pain - suspect related to shoulder joint. EKG done to rule out changes which is stable. Will assess shoulder pain and if persistent may need further testing.   Type 2 diabetes mellitus with diabetic chronic kidney disease, stage 3a   Hemoglobin A1c is 6.1, indicating good glycemic control. Kidney function is stable but requires monitoring due to family history. Order kidney function tests to monitor stability.  Hypertension   Blood pressure fluctuates between 111/55 and 157/60s. Renew the prescription for amlodipine  for a year's supply. Continue same medications. Checking CMP for stability.  Indigestion (dyspepsia)   Intermittent dyspepsia is exacerbated by prednisone . Tums provides symptomatic relief. Continue using Tums for symptomatic relief of indigestion.

## 2023-11-12 NOTE — Assessment & Plan Note (Signed)
 Blood pressure fluctuates between 111/55 and 157/60s. Renew the prescription for amlodipine  for a year's supply. Continue same medications. Checking CMP for stability.

## 2023-11-12 NOTE — Assessment & Plan Note (Signed)
 POC HGA1c done and 6.1. Conintue diet control. On ARB and statin.

## 2023-11-12 NOTE — Assessment & Plan Note (Signed)
 Using tums for symptom relief.

## 2023-11-12 NOTE — Assessment & Plan Note (Signed)
 Intermittent sharp pain in the left shoulder radiates to the chest during exercise, likely musculoskeletal in origin. Cardiac origin is less likely. Order a left shoulder X-ray and perform an EKG to rule out cardiac involvement.

## 2023-11-13 ENCOUNTER — Ambulatory Visit: Payer: Self-pay | Admitting: Internal Medicine

## 2023-11-16 ENCOUNTER — Telehealth: Payer: Self-pay

## 2023-11-16 NOTE — Telephone Encounter (Signed)
 Pt called back. I informed her that her images has not resulted yet and typically nurses will call once the clinic receives the results from imaging. Pt verbalized understanding and stated she will check back next week.

## 2023-11-16 NOTE — Telephone Encounter (Signed)
 Copied from CRM #[id number]. Topic: Clinical - Lab/Test Results >> Nov 16, 2023  9:25 AM Vicki Perry wrote: Reason for CRM: Patient would like a callback about her xray results please.   Please call her back at [phone]

## 2023-11-23 NOTE — Progress Notes (Signed)
 Called and patient and very detailed message and to give the office a call back

## 2023-11-26 NOTE — Progress Notes (Signed)
I have mailed a letter to the patient. °

## 2023-11-28 ENCOUNTER — Telehealth: Payer: Self-pay

## 2023-11-28 ENCOUNTER — Other Ambulatory Visit: Payer: Self-pay

## 2023-11-28 MED ORDER — BLOOD GLUCOSE TEST VI STRP: 1.0000 | ORAL_STRIP | Freq: Three times a day (TID) | 0 refills | Status: AC

## 2023-11-28 MED ORDER — BLOOD GLUCOSE MONITORING SUPPL DEVI: 1.0000 | Freq: Three times a day (TID) | 0 refills | Status: AC

## 2023-11-28 MED ORDER — LANCETS MISC. MISC: 1.0000 | Freq: Three times a day (TID) | 0 refills | Status: AC

## 2023-11-28 MED ORDER — LANCET DEVICE MISC: 1.0000 | Freq: Three times a day (TID) | 0 refills | Status: AC

## 2023-11-28 NOTE — Telephone Encounter (Signed)
 Pt had an issue with picking this up stated her pharmacy did not have it and needed for us  to resend this in and this has been done

## 2023-11-28 NOTE — Telephone Encounter (Signed)
 Copied from CRM [id number]. Topic: General - Other >> Nov 28, 2023  2:49 PM Chiquita SQUIBB wrote: Reason for CRM: Patient is requesting a call back from [facility] for questions on her device please contact the patient.

## 2024-01-22 ENCOUNTER — Other Ambulatory Visit: Payer: Self-pay | Admitting: Internal Medicine

## 2024-01-22 NOTE — Telephone Encounter (Unsigned)
 Copied from CRM [id number]. Topic: Clinical - Medication Refill >> Jan 22, 2024 12:25 PM Viola F wrote: Medication: losartan  (COZAAR ) 100 MG tablet [[id number]]  Has the patient contacted their pharmacy? Yes (Agent: If no, request that the patient contact the pharmacy for the refill. If patient does not wish to contact the pharmacy document the reason why and proceed with request.) (Agent: If yes, when and what did the pharmacy advise?)  This is the patient's preferred pharmacy:   [id number] [place], [place] - [address] [place] [address] Phone: [phone] Fax: [phone]  Is this the correct pharmacy for this prescription? Yes If no, delete pharmacy and type the correct one.   Has the prescription been filled recently? Yes  Is the patient out of the medication? Yes, several days   Has the patient been seen for an appointment in the last year OR does the patient have an upcoming appointment? Yes  Can we respond through MyChart? Yes  Agent: Please be advised that Rx refills may take up to 3 business days. We ask that you follow-up with your pharmacy.

## 2024-01-25 ENCOUNTER — Telehealth: Payer: Self-pay

## 2024-01-25 DIAGNOSIS — Z833 Family history of diabetes mellitus: Secondary | ICD-10-CM | POA: Diagnosis not present

## 2024-01-25 DIAGNOSIS — Z885 Allergy status to narcotic agent status: Secondary | ICD-10-CM | POA: Diagnosis not present

## 2024-01-25 DIAGNOSIS — Z809 Family history of malignant neoplasm, unspecified: Secondary | ICD-10-CM | POA: Diagnosis not present

## 2024-01-25 DIAGNOSIS — E785 Hyperlipidemia, unspecified: Secondary | ICD-10-CM | POA: Diagnosis not present

## 2024-01-25 DIAGNOSIS — Z8249 Family history of ischemic heart disease and other diseases of the circulatory system: Secondary | ICD-10-CM | POA: Diagnosis not present

## 2024-01-25 DIAGNOSIS — N1831 Chronic kidney disease, stage 3a: Secondary | ICD-10-CM | POA: Diagnosis not present

## 2024-01-25 DIAGNOSIS — Z823 Family history of stroke: Secondary | ICD-10-CM | POA: Diagnosis not present

## 2024-01-25 DIAGNOSIS — I129 Hypertensive chronic kidney disease with stage 1 through stage 4 chronic kidney disease, or unspecified chronic kidney disease: Secondary | ICD-10-CM | POA: Diagnosis not present

## 2024-01-25 DIAGNOSIS — K219 Gastro-esophageal reflux disease without esophagitis: Secondary | ICD-10-CM | POA: Diagnosis not present

## 2024-01-25 MED ORDER — LOSARTAN POTASSIUM 100 MG PO TABS: 100.0000 mg | ORAL_TABLET | Freq: Every day | ORAL | 3 refills | Status: DC

## 2024-01-25 NOTE — Telephone Encounter (Signed)
 Copied from CRM #[id number]. Topic: Clinical - Medication Question >> Jan 25, 2024  9:31 AM Alfonso HERO wrote: Reason for CRM: [facility]artan  (COZAAR ) 100 MG tablet. Patient has been without for a few days.

## 2024-01-28 ENCOUNTER — Other Ambulatory Visit: Payer: Self-pay

## 2024-01-28 DIAGNOSIS — I10 Essential (primary) hypertension: Secondary | ICD-10-CM

## 2024-01-28 MED ORDER — LOSARTAN POTASSIUM 100 MG PO TABS: 100.0000 mg | ORAL_TABLET | Freq: Every day | ORAL | 3 refills | Status: AC

## 2024-01-28 NOTE — Telephone Encounter (Signed)
 Rx sent electronically to [facility]. LVM for pt.

## 2024-01-30 ENCOUNTER — Ambulatory Visit (INDEPENDENT_AMBULATORY_CARE_PROVIDER_SITE_OTHER)

## 2024-01-30 VITALS — BP 132/68 | HR 68 | Ht 63.0 in | Wt 137.2 lb

## 2024-01-30 DIAGNOSIS — Z0000 Encounter for general adult medical examination without abnormal findings: Secondary | ICD-10-CM

## 2024-01-30 DIAGNOSIS — Z1211 Encounter for screening for malignant neoplasm of colon: Secondary | ICD-10-CM

## 2024-01-30 NOTE — Progress Notes (Signed)
 Chief Complaint  Patient presents with   Medicare Wellness     Subjective:   Vicki Perry is a 73 y.o. female who presents for a Medicare Annual Wellness Visit.  Allergies (verified) Codeine   History: Past Medical History:  Diagnosis Date   Anemia    CAD (coronary artery disease)    Diabetes mellitus    High cholesterol    Hypertension    Past Surgical History:  Procedure Laterality Date   CATARACT EXTRACTION, BILATERAL Bilateral 03/22/2018   CESAREAN SECTION     FOOT SURGERY Right    Bone Spur removal   TONSILLECTOMY     WISDOM TOOTH EXTRACTION     Family History  Problem Relation Age of Onset   Breast cancer Mother 32       metastesized from cervical cancer   Diabetes Mother    Hypertension Mother    Cervical cancer Mother 7   Hypertension Father    Hypertension Sister    Diabetes Sister    Diabetes Maternal Grandmother    Hypertension Brother    Kidney failure Brother    Stroke Brother    Heart disease Neg Hx    BRCA 1/2 Neg Hx    Social History   Occupational History   Occupation: retired from [employer]  Tobacco Use   Smoking status: Never   Smokeless tobacco: Never  Vaping Use   Vaping status: Never Used  Substance and Sexual Activity   Alcohol use: No   Drug use: No   Sexual activity: Not Currently   Tobacco Counseling Counseling given: Not Answered  SDOH Screenings   Food Insecurity: No Food Insecurity (01/30/2024)  Housing: Unknown (01/30/2024)  Transportation Needs: No Transportation Needs (01/30/2024)  Utilities: Not At Risk (01/30/2024)  Alcohol Screen: Low Risk  (12/07/2022)  Depression (PHQ2-9): Low Risk  (01/30/2024)  Financial Resource Strain: Low Risk  (12/07/2022)  Physical Activity: Insufficiently Active (01/30/2024)  Social Connections: Moderately Isolated (01/30/2024)  Stress: No Stress Concern Present (01/30/2024)  Tobacco Use: Low Risk  (01/30/2024)  Health Literacy: Adequate Health Literacy (01/30/2024)   Depression  Screen    01/30/2024   12:07 PM 11/12/2023    9:34 AM 12/07/2022    9:19 AM 02/21/2022    9:16 AM 02/03/2022   11:41 AM 02/03/2022   11:38 AM 10/03/2021   10:54 AM  PHQ 2/9 Scores  PHQ - 2 Score 0 0 0 0 0 0 0  PHQ- 9 Score 0  1  0    0      Data saved with a previous flowsheet row definition     Goals Addressed               This Visit's Progress     Patient Stated        Patient Stated (pt-stated)        Patient stated she plans to continue exercising and lose weight       Visit info / Clinical Intake: Medicare Wellness Visit Type:: Subsequent Annual Wellness Visit Persons participating in visit:: patient Medicare Wellness Visit Mode:: In-person (required for WTM) Information given by:: patient Interpreter Needed?: No Pre-visit prep was completed: yes AWV questionnaire completed by patient prior to visit?: no Living arrangements:: (!) lives alone Patient's Overall Health Status Rating: good Typical amount of pain: none Does pain affect daily life?: no Are you currently prescribed opioids?: no  Dietary Habits and Nutritional Risks How many meals a day?: 2 Eats fruit and vegetables daily?:  yes Most meals are obtained by: preparing own meals In the last 2 weeks, have you had any of the following?: none Diabetic:: (!) yes Any non-healing wounds?: no How often do you check your BS?: as needed  Functional Status Activities of Daily Living (to include ambulation/medication): Independent Ambulation: Independent with device- listed below Home Assistive Devices/Equipment: Eyeglasses; Dentures (specify type) Medication Administration: Independent Home Management: Independent Manage your own finances?: yes Primary transportation is: driving Concerns about vision?: no *vision screening is required for WTM* Concerns about hearing?: no  Fall Screening Falls in the past year?: 0 Number of falls in past year: 0 Was there an injury with Fall?: 0 Fall Risk Category  Calculator: 0 Patient Fall Risk Level: Low Fall Risk  Fall Risk Patient at Risk for Falls Due to: No Fall Risks Fall risk Follow up: Falls evaluation completed; Falls prevention discussed  Home and Transportation Safety: All rugs have non-skid backing?: yes All stairs or steps have railings?: yes Grab bars in the bathtub or shower?: yes Have non-skid surface in bathtub or shower?: yes Good home lighting?: yes Regular seat belt use?: yes Hospital stays in the last year:: no  Cognitive Assessment Difficulty concentrating, remembering, or making decisions? : no Will 6CIT or Mini Cog be Completed: yes What year is it?: 0 points What month is it?: 0 points Give patient an address phrase to remember (5 components): [address] [place], [place] About what time is it?: 0 points Count backwards from 20 to 1: 0 points Say the months of the year in reverse: 0 points Repeat the address phrase from earlier: 0 points 6 CIT Score: 0 points  Advance Directives (For Healthcare) Does Patient Have a Medical Advance Directive?: No Would patient like information on creating a medical advance directive?: No - Patient declined  Reviewed/Updated  Reviewed/Updated: Reviewed All (Medical, Surgical, Family, Medications, Allergies, Care Teams, Patient Goals)        Objective:    Today's Vitals   01/30/24 1200  BP: 132/68  Pulse: 68  SpO2: 98%  Weight: 137 lb 3.2 oz (62.2 kg)  Height: 5' 3 (1.6 m)   Body mass index is 24.3 kg/m.  Current Medications (verified) Outpatient Encounter Medications as of 01/30/2024  Medication Sig   amLODipine  (NORVASC ) 5 MG tablet Take 1 tablet (5 mg total) by mouth daily.   Blood Glucose Monitoring Suppl DEVI 1 each by Does not apply route in the morning, at noon, and at bedtime. May substitute to any manufacturer covered by patient's insurance.   Calcium  Carb-Cholecalciferol (CALCIUM  500/D) 500-400 MG-UNIT CHEW Chew 1 tablet by mouth daily.   hydrALAZINE   (APRESOLINE ) 50 MG tablet TAKE 1 TABLET (50 MG) BY MOUTH IN THE MORNING AND AT BEDTIME   losartan  (COZAAR ) 100 MG tablet Take 1 tablet (100 mg total) by mouth daily.   rosuvastatin  (CRESTOR ) 5 MG tablet Take 1 tablet (5 mg total) by mouth once a week.   DENTA 5000 PLUS 1.1 % CREA dental cream Place 1 Application onto teeth at bedtime. (Patient not taking: Reported on 01/30/2024)   No facility-administered encounter medications on file as of 01/30/2024.   Hearing/Vision screen Hearing Screening - Comments:: Denies hearing difficulties   Vision Screening - Comments:: Wears eyeglasses for reading - up to date with routine eye exams with [facility] Immunizations and Health Maintenance Health Maintenance  Topic Date Due   Diabetic kidney evaluation - Urine ACR  Never done   Fecal DNA (Cologuard)  10/08/2022  DTaP/Tdap/Td (2 - Td or Tdap) 09/11/2023   COVID-19 Vaccine (4 - 2025-26 season) 11/19/2023   Zoster Vaccines- Shingrix (1 of 2) 05/01/2024 (Originally 05/11/1998)   Influenza Vaccine  06/17/2024 (Originally 10/19/2023)   OPHTHALMOLOGY EXAM  04/30/2024   FOOT EXAM  05/14/2024   HEMOGLOBIN A1C  05/14/2024   Diabetic kidney evaluation - eGFR measurement  11/11/2024   Medicare Annual Wellness (AWV)  01/29/2025   Pneumococcal Vaccine: 50+ Years  Completed   DEXA SCAN  Completed   Hepatitis C Screening  Completed   Meningococcal B Vaccine  Aged Out   Mammogram  Discontinued   Colonoscopy  Discontinued        Assessment/Plan:  This is a routine wellness examination for Lakie.  Patient Care Team: Rollene Almarie LABOR, MD as PCP - General (Internal Medicine) Kate Lonni CROME, MD as PCP - Cardiology (Cardiology)  I have personally reviewed and noted the following in the patient's chart:   Medical and social history Use of alcohol, tobacco or illicit drugs  Current medications and supplements including opioid prescriptions. Functional ability and status Nutritional  status Physical activity Advanced directives List of other physicians Hospitalizations, surgeries, and ER visits in previous 12 months Vitals Screenings to include cognitive, depression, and falls Referrals and appointments  Orders Placed This Encounter  Procedures   Cologuard   In addition, I have reviewed and discussed with patient certain preventive protocols, quality metrics, and best practice recommendations. A written personalized care plan for preventive services as well as general preventive health recommendations were provided to patient.   Verdie CHRISTELLA Saba, CMA   01/30/2024   Return in 1 year (on 01/29/2025).  After Visit Summary: (In Person-Declined) Patient declined AVS at this time.  Nurse Notes: Cologuard ordered.  Scheduled 2026 AWV/CPE appts.  Pt states will call to schedule an Influenza vaccine appt in 1-2wks after trip w/nurse.  Declined to get today.

## 2024-01-30 NOTE — Patient Instructions (Addendum)
 Vicki Perry,  Thank you for taking the time for your Medicare Wellness Visit. I appreciate your continued commitment to your health goals. Please review the care plan we discussed, and feel free to reach out if I can assist you further.  Please note that Annual Wellness Visits do not include a physical exam. Some assessments may be limited, especially if the visit was conducted virtually. If needed, we may recommend an in-person follow-up with your provider.  Ongoing Care Seeing your primary care provider every 3 to 6 months helps us  monitor your health and provide consistent, personalized care.   Referrals If a referral was made during today's visit and you haven't received any updates within two weeks, please contact the referred provider directly to check on the status.  Recommended Screenings:  Health Maintenance  Topic Date Due   Yearly kidney health urinalysis for diabetes  Never done   Cologuard (Stool DNA test)  10/08/2022   DTaP/Tdap/Td vaccine (2 - Td or Tdap) 09/11/2023   COVID-19 Vaccine (4 - 2025-26 season) 11/19/2023   Zoster (Shingles) Vaccine (1 of 2) 05/01/2024*   Flu Shot  06/17/2024*   Eye exam for diabetics  04/30/2024   Complete foot exam   05/14/2024   Hemoglobin A1C  05/14/2024   Yearly kidney function blood test for diabetes  11/11/2024   Medicare Annual Wellness Visit  01/29/2025   Pneumococcal Vaccine for age over 37  Completed   DEXA scan (bone density measurement)  Completed   Hepatitis C Screening  Completed   Meningitis B Vaccine  Aged Out   Breast Cancer Screening  Discontinued   Colon Cancer Screening  Discontinued  *Topic was postponed. The date shown is not the original due date.       01/30/2024   12:02 PM  Advanced Directives  Does Patient Have a Medical Advance Directive? No  Would patient like information on creating a medical advance directive? No - Patient declined    Vision: Annual vision screenings are recommended for early  detection of glaucoma, cataracts, and diabetic retinopathy. These exams can also reveal signs of chronic conditions such as diabetes and high blood pressure.  Dental: Annual dental screenings help detect early signs of oral cancer, gum disease, and other conditions linked to overall health, including heart disease and diabetes.

## 2024-02-28 LAB — COLOGUARD: COLOGUARD: NEGATIVE

## 2024-02-29 ENCOUNTER — Ambulatory Visit: Payer: Self-pay | Admitting: Internal Medicine

## 2024-03-04 ENCOUNTER — Other Ambulatory Visit: Payer: Self-pay | Admitting: Internal Medicine

## 2024-03-04 DIAGNOSIS — Z1231 Encounter for screening mammogram for malignant neoplasm of breast: Secondary | ICD-10-CM

## 2024-05-08 ENCOUNTER — Ambulatory Visit

## 2024-05-16 ENCOUNTER — Ambulatory Visit: Admitting: Internal Medicine

## 2025-02-17 ENCOUNTER — Encounter: Admitting: Internal Medicine

## 2025-02-17 ENCOUNTER — Ambulatory Visit
